# Patient Record
Sex: Male | Born: 1935 | Race: White | Hispanic: No | Marital: Married | Smoking: Former smoker
Health system: Southern US, Community
[De-identification: ages and names within clinical notes are randomized; demographics above are authoritative.]

## PROBLEM LIST (undated history)

## (undated) DIAGNOSIS — I5043 Acute on chronic combined systolic (congestive) and diastolic (congestive) heart failure: Secondary | ICD-10-CM

## (undated) DIAGNOSIS — I10 Essential (primary) hypertension: Secondary | ICD-10-CM

## (undated) DIAGNOSIS — I4891 Unspecified atrial fibrillation: Secondary | ICD-10-CM

## (undated) DIAGNOSIS — I714 Abdominal aortic aneurysm, without rupture, unspecified: Secondary | ICD-10-CM

## (undated) DIAGNOSIS — J9611 Chronic respiratory failure with hypoxia: Secondary | ICD-10-CM

## (undated) DIAGNOSIS — M1990 Unspecified osteoarthritis, unspecified site: Secondary | ICD-10-CM

## (undated) DIAGNOSIS — K635 Polyp of colon: Secondary | ICD-10-CM

## (undated) DIAGNOSIS — N400 Enlarged prostate without lower urinary tract symptoms: Secondary | ICD-10-CM

## (undated) DIAGNOSIS — I4719 Other supraventricular tachycardia: Secondary | ICD-10-CM

## (undated) DIAGNOSIS — I214 Non-ST elevation (NSTEMI) myocardial infarction: Secondary | ICD-10-CM

## (undated) DIAGNOSIS — I5041 Acute combined systolic (congestive) and diastolic (congestive) heart failure: Secondary | ICD-10-CM

## (undated) DIAGNOSIS — I471 Supraventricular tachycardia: Secondary | ICD-10-CM

## (undated) DIAGNOSIS — J309 Allergic rhinitis, unspecified: Secondary | ICD-10-CM

## (undated) HISTORY — DX: Abdominal aortic aneurysm, without rupture: I71.4

## (undated) HISTORY — DX: Acute combined systolic (congestive) and diastolic (congestive) heart failure: I50.41

## (undated) HISTORY — DX: Unspecified atrial fibrillation: I48.91

## (undated) HISTORY — DX: Other supraventricular tachycardia: I47.19

## (undated) HISTORY — DX: Non-ST elevation (NSTEMI) myocardial infarction: I21.4

## (undated) HISTORY — PX: REPLACEMENT TOTAL KNEE: SUR1224

## (undated) HISTORY — PX: COLONOSCOPY: SHX174

## (undated) HISTORY — DX: Supraventricular tachycardia: I47.1

## (undated) HISTORY — DX: Unspecified osteoarthritis, unspecified site: M19.90

## (undated) HISTORY — PX: HERNIA REPAIR: SHX51

## (undated) HISTORY — DX: Benign prostatic hyperplasia without lower urinary tract symptoms: N40.0

## (undated) HISTORY — PX: CATARACT EXTRACTION, BILATERAL: SHX1313

## (undated) HISTORY — DX: Essential (primary) hypertension: I10

## (undated) HISTORY — DX: Polyp of colon: K63.5

## (undated) HISTORY — PX: TONSILLECTOMY: SUR1361

## (undated) HISTORY — DX: Acute on chronic combined systolic (congestive) and diastolic (congestive) heart failure: I50.43

## (undated) HISTORY — DX: Abdominal aortic aneurysm, without rupture, unspecified: I71.40

## (undated) HISTORY — DX: Chronic respiratory failure with hypoxia: J96.11

## (undated) HISTORY — DX: Allergic rhinitis, unspecified: J30.9

---

## 2000-01-22 ENCOUNTER — Encounter: Payer: Self-pay | Admitting: Geriatric Medicine

## 2000-01-22 ENCOUNTER — Encounter: Admission: RE | Admit: 2000-01-22 | Discharge: 2000-01-22 | Payer: Self-pay | Admitting: Geriatric Medicine

## 2003-12-22 ENCOUNTER — Ambulatory Visit (HOSPITAL_COMMUNITY): Admission: RE | Admit: 2003-12-22 | Discharge: 2003-12-22 | Payer: Self-pay | Admitting: Geriatric Medicine

## 2005-02-12 ENCOUNTER — Ambulatory Visit (HOSPITAL_COMMUNITY): Admission: RE | Admit: 2005-02-12 | Discharge: 2005-02-12 | Payer: Self-pay | Admitting: Gastroenterology

## 2005-02-12 ENCOUNTER — Encounter (INDEPENDENT_AMBULATORY_CARE_PROVIDER_SITE_OTHER): Payer: Self-pay | Admitting: *Deleted

## 2008-02-20 ENCOUNTER — Encounter: Admission: RE | Admit: 2008-02-20 | Discharge: 2008-02-20 | Payer: Self-pay | Admitting: Geriatric Medicine

## 2009-07-06 ENCOUNTER — Encounter: Admission: RE | Admit: 2009-07-06 | Discharge: 2009-07-06 | Payer: Self-pay | Admitting: Geriatric Medicine

## 2009-07-18 ENCOUNTER — Encounter: Admission: RE | Admit: 2009-07-18 | Discharge: 2009-07-18 | Payer: Self-pay | Admitting: Geriatric Medicine

## 2010-03-23 ENCOUNTER — Encounter: Admission: RE | Admit: 2010-03-23 | Discharge: 2010-03-23 | Payer: Self-pay | Admitting: Geriatric Medicine

## 2011-02-16 NOTE — Op Note (Signed)
[name], [name]                 ACCOUNT NO.:  [id number]   MEDICAL RECORD NO.:  [id number]          PATIENT TYPE:  AMB   LOCATION:  ENDO                         FACILITY:  [facility]   PHYSICIAN:  Earle Gell, M.D.   DATE OF BIRTH:  21-Aug-1936   DATE OF PROCEDURE:  02/12/2005  DATE OF DISCHARGE:                                 OPERATIVE REPORT   PROCEDURE:  Colonoscopy and polypectomy.   PROCEDURE INDICATIONS:  [name] is a 75 year old male born Aug 13, 1936.  Mr. Carias is scheduled to undergo his first screening colonoscopy with  polypectomy to prevent colon cancer.  His 1999 and 2003 health maintenance  flexible proctosigmoidoscopies performed by Dr. [name] were normal.   ENDOSCOPIST:  Earle Gell, M.D.   PREMEDICATION:  Versed 7 mg, Demerol 70 mg.   PROCEDURE:  After obtaining informed consent, Mr. Gettel was placed in the  left lateral decubitus position.  I administered intravenous Demerol and  intravenous Versed to achieve conscious sedation for the procedure.  The  patient's blood pressure, oxygen saturation and cardiac rhythm were  monitored throughout the procedure and documented in the medical record.   Anal inspection was normal.  Digital rectal exam reveals an enlarged  prostate.  The Olympus adjustable pediatric colonoscope was then introduced  into the rectum and with a great deal of difficulty, eventually advanced to  the cecum.  Advancing the colonoscope to the hepatic flexure was relatively  easy.  Due to colonic loop formation,  I had a great deal of difficulty in  rounding the hepatic flexure and eventually intubating the cecum.  This was  accomplished with the patient in the supine position applying periumbilical  abdominal pressure.  Colonic preparation for the exam today was  satisfactory.   RECTUM:  Normal.   SIGMOID COLON AND DESCENDING COLON:  Extensive left colonic diverticulosis.  Small polyps could have easily been missed in this  segment of the colon due  to the vast number of diverticula.   SPLENIC FLEXURE:  Normal.   TRANSVERSE COLON:  Normal.   HEPATIC FLEXURE:  Normal.   ASCENDING COLON:  Normal.   CECUM AND ILEOCECAL VALVE:  From the distal cecum, a 2-mm sessile polyp was  removed with the electrocautery snare.   ASSESSMENT:  1.  Extensive left colonic diverticulosis.  2.  A small polyp was removed from the distal cecum.   RECOMMENDATIONS:  Virtual colonoscopy in 5 years.      MJ/MEDQ  D:  02/12/2005  T:  02/12/2005  Job:  [id number]   cc:   Hal T. Stoneking, M.D.  [address] [place], [place] [address]  Fax: [phone]

## 2015-05-10 ENCOUNTER — Ambulatory Visit
Admission: RE | Admit: 2015-05-10 | Discharge: 2015-05-10 | Disposition: A | Discharge status: Home / Self Care | Payer: Medicare Other | Source: Ambulatory Visit | Attending: Geriatric Medicine | Admitting: Geriatric Medicine

## 2015-05-10 ENCOUNTER — Other Ambulatory Visit: Payer: Self-pay | Admitting: Geriatric Medicine

## 2015-05-10 DIAGNOSIS — R05 Cough: Secondary | ICD-10-CM

## 2015-05-10 DIAGNOSIS — R059 Cough, unspecified: Secondary | ICD-10-CM

## 2016-04-17 ENCOUNTER — Other Ambulatory Visit: Payer: Self-pay | Admitting: Geriatric Medicine

## 2016-04-17 ENCOUNTER — Ambulatory Visit
Admission: RE | Admit: 2016-04-17 | Discharge: 2016-04-17 | Disposition: A | Discharge status: Home / Self Care | Payer: Medicare Other | Source: Ambulatory Visit | Attending: Geriatric Medicine | Admitting: Geriatric Medicine

## 2016-04-17 DIAGNOSIS — R042 Hemoptysis: Secondary | ICD-10-CM

## 2016-04-18 ENCOUNTER — Other Ambulatory Visit: Payer: Self-pay | Admitting: Geriatric Medicine

## 2016-04-18 DIAGNOSIS — I712 Thoracic aortic aneurysm, without rupture, unspecified: Secondary | ICD-10-CM

## 2016-04-23 ENCOUNTER — Other Ambulatory Visit: Payer: Self-pay | Admitting: Geriatric Medicine

## 2016-04-23 DIAGNOSIS — I712 Thoracic aortic aneurysm, without rupture, unspecified: Secondary | ICD-10-CM

## 2016-04-23 DIAGNOSIS — J8410 Pulmonary fibrosis, unspecified: Secondary | ICD-10-CM

## 2016-04-24 ENCOUNTER — Ambulatory Visit
Admission: RE | Admit: 2016-04-24 | Discharge: 2016-04-24 | Disposition: A | Discharge status: Home / Self Care | Payer: Medicare Other | Source: Ambulatory Visit | Attending: Geriatric Medicine | Admitting: Geriatric Medicine

## 2016-04-24 DIAGNOSIS — J8410 Pulmonary fibrosis, unspecified: Secondary | ICD-10-CM

## 2016-04-24 DIAGNOSIS — I712 Thoracic aortic aneurysm, without rupture, unspecified: Secondary | ICD-10-CM

## 2016-04-26 ENCOUNTER — Other Ambulatory Visit: Payer: Self-pay | Admitting: *Deleted

## 2016-04-26 ENCOUNTER — Encounter: Payer: Self-pay | Admitting: *Deleted

## 2016-04-26 ENCOUNTER — Telehealth: Payer: Self-pay | Admitting: Pulmonary Disease

## 2016-04-26 ENCOUNTER — Other Ambulatory Visit (INDEPENDENT_AMBULATORY_CARE_PROVIDER_SITE_OTHER): Payer: Medicare Other

## 2016-04-26 ENCOUNTER — Ambulatory Visit (INDEPENDENT_AMBULATORY_CARE_PROVIDER_SITE_OTHER): Payer: Medicare Other | Admitting: Pulmonary Disease

## 2016-04-26 DIAGNOSIS — I719 Aortic aneurysm of unspecified site, without rupture: Secondary | ICD-10-CM

## 2016-04-26 DIAGNOSIS — Z77090 Contact with and (suspected) exposure to asbestos: Secondary | ICD-10-CM

## 2016-04-26 DIAGNOSIS — J309 Allergic rhinitis, unspecified: Secondary | ICD-10-CM | POA: Insufficient documentation

## 2016-04-26 DIAGNOSIS — I10 Essential (primary) hypertension: Secondary | ICD-10-CM

## 2016-04-26 DIAGNOSIS — J849 Interstitial pulmonary disease, unspecified: Secondary | ICD-10-CM

## 2016-04-26 DIAGNOSIS — J302 Other seasonal allergic rhinitis: Secondary | ICD-10-CM

## 2016-04-26 DIAGNOSIS — R011 Cardiac murmur, unspecified: Secondary | ICD-10-CM | POA: Insufficient documentation

## 2016-04-26 DIAGNOSIS — J439 Emphysema, unspecified: Secondary | ICD-10-CM

## 2016-04-26 DIAGNOSIS — N400 Enlarged prostate without lower urinary tract symptoms: Secondary | ICD-10-CM | POA: Insufficient documentation

## 2016-04-26 DIAGNOSIS — M1990 Unspecified osteoarthritis, unspecified site: Secondary | ICD-10-CM

## 2016-04-26 HISTORY — DX: Aortic aneurysm of unspecified site, without rupture: I71.9

## 2016-04-26 HISTORY — DX: Essential (primary) hypertension: I10

## 2016-04-26 HISTORY — DX: Allergic rhinitis, unspecified: J30.9

## 2016-04-26 HISTORY — DX: Emphysema, unspecified: J43.9

## 2016-04-26 HISTORY — DX: Contact with and (suspected) exposure to asbestos: Z77.090

## 2016-04-26 HISTORY — DX: Benign prostatic hyperplasia without lower urinary tract symptoms: N40.0

## 2016-04-26 HISTORY — DX: Cardiac murmur, unspecified: R01.1

## 2016-04-26 HISTORY — DX: Interstitial pulmonary disease, unspecified: J84.9

## 2016-04-26 LAB — SEDIMENTATION RATE: Sed Rate: 77 mm/hr — ABNORMAL HIGH (ref 0–20)

## 2016-04-26 LAB — RHEUMATOID FACTOR: RHEUMATOID FACTOR: 24 [IU]/mL — AB (ref <=14)

## 2016-04-26 LAB — C-REACTIVE PROTEIN: CRP: 2.4 mg/dL (ref 0.5–20.0)

## 2016-04-26 NOTE — Patient Instructions (Addendum)
Call me if you notice any new breathing problems before your next appointment.  We will review your test results at your next appointment.  Try using your inhaler during times when you're short of breath to see if it helps.  I will see you back in 4-6 weeks.  TESTS ORDERED: 1. Complete Echocardiogram with contrast 2. Full PFT before next appointment 3. 6MWT before next appointment 4. Serum alpha-1 antitrypsin phenotype, ESR, CRP, ANA with reflex to comprehensive, rheumatoid factor, anti-CCP, & hypersensitivity pneumonitis panel.

## 2016-04-26 NOTE — Telephone Encounter (Signed)
IMAGING HRCT CHEST W/O 04/24/16 (personally reviewed by me): Borderline cardiomegaly with mild aneurysmal dilatation of ascending aorta measuring 4.5 cm. Bilateral calcified pleural plaques noted. Apical predominant centrilobular & paraseptal emphysema. Cystic changes primarily in the apices likely more secondary to underlying emphysema. No pleural effusion. Patient does have some air trapping on expiratory phase. Diffuse bilateral intralobular septal thickening with some bilateral traction bronchiectasis. Findings appear more pronounced in the apices with some suggestion of sparing in the sulci bilaterally. No suggestion of honeycomb changes. Mediastinal and hilar adenopathy with largest lymph node precarinal measuring 1.3 cm. No axillary lymphadenopathy.

## 2016-04-26 NOTE — Progress Notes (Signed)
Subjective:    Patient ID: [name], male    DOB: 19-Apr-1936, 80 y.o.   MRN: [id number]  HPI He reports that for the last 3-4 years he has had a yearly "cold" that progresses to "acute bronchitis" that produces a cough that takes him long to get over. He reports this last episode he was prescribed an inhaler and cough medication with codeine that lingered. He reports that his breathing seemed to be worse than his baseline as well. He feels now his cough has completely resolved. He still notices significant dyspnea on exertion. He reports he is able to climb the 15 steps from his basement and keep going without stopping. He reports that walking 269f back to his house from his shed up a slight  Incline he has dyspnea but again doesn't have to stop. He has had wheezing intermittently but none recently. He does feel he sleeps better if he uses his inhaler at night before bed. He denies any chest tightness, pressure, pain or heaviness. He reports mild seasonal allergies and uses Flonase occasionally. Reports only intermittent reflux but nothing regular. No dysphagia or odynophagia. Does have occasional morning brash water taste. Reports he has had occasional rash around his ankles, particularly in the winter. He reports chronic pain in his right knee. No joint swelling or erythema. No stiffness in his hands. No dry eyes or dry mouth. No oral ulcers. No Raynaud's.  Review of Systems No dysuria or hematuria. No adenopathy in his neck, groin, or axilla. A pertinent 14 point review of systems is negative except as per the history of presenting illness.  No Known Allergies   Current Outpatient Prescriptions:  .  albuterol (PROAIR HFA) 108 (90 Base) MCG/ACT inhaler, Inhale 2 puffs into the lungs every 6 (six) hours as needed for wheezing or shortness of breath., Disp: , Rfl:  .  amLODipine (NORVASC) 10 MG tablet, Take 10 mg by mouth daily., Disp: , Rfl:  .  aspirin 81 MG tablet, Take 81 mg by mouth  daily., Disp: , Rfl:  .  fluticasone (FLONASE) 50 MCG/ACT nasal spray, 2 sprays each nostril daily as needed, Disp: , Rfl:  .  hydrochlorothiazide (HYDRODIURIL) 25 MG tablet, 1/2 daily, Disp: , Rfl:  .  KRILL OIL PO, Take by mouth daily., Disp: , Rfl:  .  losartan (COZAAR) 50 MG tablet, 1 tab daily, Disp: , Rfl:  .  Multiple Vitamin (MULTIVITAMIN) tablet, Take 1 tablet by mouth daily., Disp: , Rfl:  .  triamcinolone (KENALOG) 0.025 % cream, As needed, Disp: , Rfl:   Past Medical History:  Diagnosis Date  . [place]   . Allergic rhinitis   . BPH (benign prostatic hypertrophy)   . Colon polyp   . Hypertension   . Osteoarthritis     Past Surgical History:  Procedure Laterality Date  . CATARACT EXTRACTION, BILATERAL    . COLONOSCOPY    . HERNIA REPAIR     as child  . REPLACEMENT TOTAL KNEE Left   . TONSILLECTOMY      Family History  Problem Relation Age of Onset  . Heart disease Mother   . Leukemia Father   . Lung disease Neg Hx   . Rheumatologic disease Neg Hx     Social History   Social History  . Marital status: Married    Spouse name: N/A  . Number of children: 2  . Years of education: N/A   Occupational History  .  retired    Social History Main Topics  . Smoking status: Former Smoker    Packs/day: 1.00    Years: 10.00    Types: Cigarettes, Pipe, Cigars    Start date: 10/02/1955    Quit date: 10/01/1965  . Smokeless tobacco: Former Systems developer    Types: Chew  . Alcohol use No  . Drug use: No  . Sexual activity: Not Asked   Other Topics Concern  . None   Social History Narrative   Originally from [place]. Has always lived in [place]. Served in [employer] and has traveled aboard ship extensively. 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 Has asbestos exposure through his work for 3.5 years from 1957-1961. 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 No mold or bird exposure. Enjoys  golfing.       Objective:   Physical Exam BP 132/88 (BP Location: Left Arm, Cuff Size: Large)   Pulse 99   Ht _0  (1.905 m)   Wt 251 lb (113.9 kg)   SpO2 90%   BMI 31.37 kg/m  General:  Awake. Alert. No acute distress. Wife accompanying patient today. Integument:  Warm & dry. No rash on exposed skin.  Lymphatics:  No appreciated cervical or supraclavicular lymphadenoapthy. HEENT:  Moist mucus membranes. No oral ulcers. No scleral injection or icterus.  Cardiovascular:  Regular rate. 1+ pitting lower extremity edema. 3/6 systolic ejection murmur in aortic position. Pulmonary:  Good aeration & clear to auscultation bilaterally. Symmetric chest wall expansion. No accessory muscle use on room air. Abdomen: Soft. Normal bowel sounds. Protuberant. Grossly nontender. Musculoskeletal:  Normal bulk and tone. Hand grip strength 5/5 bilaterally. No joint deformity or effusion appreciated. Neurological:  CN 2-12 grossly in tact. No meningismus. Moving all 4 extremities equally. Symmetric brachioradialis deep tendon reflexes. Psychiatric:  Mood and affect congruent. Speech normal rhythm, rate & tone.   IMAGING HRCT CHEST W/O 04/24/16 (personally reviewed by me): Borderline cardiomegaly with mild aneurysmal dilatation of ascending aorta measuring 4.5 cm. Bilateral calcified pleural plaques noted. Apical predominant centrilobular & paraseptal emphysema. Cystic changes primarily in the apices likely more secondary to underlying emphysema. No pleural effusion. Patient does have some air trapping on expiratory phase. Diffuse bilateral intralobular septal thickening with some bilateral traction bronchiectasis. Findings appear more pronounced in the apices with some suggestion of sparing in the sulci bilaterally. No suggestion of honeycomb changes. Mediastinal and hilar adenopathy with largest lymph node precarinal measuring 1.3 cm. No axillary lymphadenopathy. We compared the patient's CT imaging from 2010  cystic changes the apices appear more pronounced in there is some intralobular septal thickening even on that CT scan.    Assessment & Plan:  80 year old male with significant prior exposure to asbestos. This is likely the cause for his calcified pleural plaques which are seen on CT imaging. I reviewed the patient's CT imaging with both he and his wife who was present today. The pattern on his high-resolution CT scan appears most consistent with worsening of his underlying emphysema and apical predominant cystic changes. He does have a 10+ pack year history of smoking which would raise his risk for COPD/emphysema as well as secondhand smoke exposure which was significant. The degree of worsening in his interstitial lung disease when compared with CT imaging from 2010 appears mild and I'm not sure of how much clinical significance especially in the setting of the air trapping seen on  his expiratory images. Certainly an autoimmune process is possible with his history of arthritis but this seems to be more consistent with osteoarthritis. Therefore, I am not starting the patient on immunosuppression at this time. Additionally, I do not feel that a surgical lung biopsy would be of high yield for the patient at this time. I'm holding off on initiating any additional inhaler medications until full pulmonary function testing can be obtained. As a final thought hypoxia on exertion could be contributing to his symptoms as well. I instructed the patient contact my office if he had any further breathing problems or questions before his next appointment and that we will review the test results at his follow-up.  1. Emphysema: Screening for alpha-1 antitrypsin deficiency. Checking full pulmonary function testing. Advised patient to try using albuterol inhaler to help treat dyspnea. 2. H/O Asbestos Exposure: Evident as pleural plaque formation with partial calcification on CT imaging. 3. ILD: Holding off on  immunosuppression at this time. Checking full pulmonary function testing as well as 6 minute walk test. Checking autoimmune workup as in #4. Also checking hypersensitivity pneumonitis panel. 4. Arthritis:  Suspect osteoarthritis. Checking serum ESR, CRP, ANA with reflex to comprehensive panel, rheumatoid factor, & anti-CCP. 5. Allergic Rhinitis: Continuing intermittent Flonase. Asymptomatic. 6. Heart Murmur: Checking complete transthoracic echocardiogram. 7. Health Maintenance: Status post Pneumovax January 2004 & Prevnar January 2016. 8. Follow-up: Patient to return to clinic in 4-6 weeks.  Sonia Baller Ashok Cordia, M.D. [facility] Pulmonary & Critical Care Pager:  [phone] After 3pm or if no response, call [phone] 11:09 AM 04/26/16

## 2016-04-27 ENCOUNTER — Ambulatory Visit (HOSPITAL_COMMUNITY): Payer: Medicare Other | Attending: Internal Medicine

## 2016-04-27 ENCOUNTER — Other Ambulatory Visit (HOSPITAL_COMMUNITY): Payer: Self-pay

## 2016-04-27 DIAGNOSIS — R011 Cardiac murmur, unspecified: Secondary | ICD-10-CM

## 2016-04-27 DIAGNOSIS — I119 Hypertensive heart disease without heart failure: Secondary | ICD-10-CM | POA: Insufficient documentation

## 2016-04-27 DIAGNOSIS — I358 Other nonrheumatic aortic valve disorders: Secondary | ICD-10-CM | POA: Insufficient documentation

## 2016-04-27 DIAGNOSIS — Z87891 Personal history of nicotine dependence: Secondary | ICD-10-CM | POA: Diagnosis not present

## 2016-04-27 LAB — ANA COMPREHENSIVE PANEL
Anti JO-1: <0.2 AI (ref 0.0–0.9)
Centromere Ab Screen: <0.2 AI (ref 0.0–0.9)
dsDNA Ab: 1 IU/mL (ref 0–9)

## 2016-04-27 LAB — CYCLIC CITRUL PEPTIDE ANTIBODY, IGG: CYCLIC CITRULLIN PEPTIDE AB: 92 U — AB

## 2016-04-27 LAB — ANA W/REFLEX: Anti Nuclear Antibody(ANA): NEGATIVE

## 2016-04-30 ENCOUNTER — Other Ambulatory Visit: Payer: Self-pay | Admitting: Pulmonary Disease

## 2016-04-30 DIAGNOSIS — R768 Other specified abnormal immunological findings in serum: Secondary | ICD-10-CM

## 2016-04-30 LAB — ALPHA-1-ANTITRYPSIN: A1 ANTITRYPSIN SER: 251 mg/dL — AB (ref 83–199)

## 2016-05-01 ENCOUNTER — Telehealth: Payer: Self-pay | Admitting: Pulmonary Disease

## 2016-05-01 LAB — HYPERSENSITIVITY PNUEMONITIS PROFILE

## 2016-05-01 NOTE — Telephone Encounter (Signed)
Spoke with the pt  He states that he received a call from someone but that person did not leave a name  He believes that this may be regarding appt with rheum  I see that we did make a referral, so will forward to [facility] to see if they called him  Please advise, thanks!

## 2016-05-01 NOTE — Telephone Encounter (Signed)
Called pt.  He states it was [facility] that called him.  He said he called them back & was put on hold & no one ever came back to the phone.  I verified he did have the correct # & apologized for no one coming back to him.  He was understanding & said he would call them back in the morning because they have already closed for today.  Nothing further needed at this time.

## 2016-06-12 ENCOUNTER — Encounter (INDEPENDENT_AMBULATORY_CARE_PROVIDER_SITE_OTHER): Payer: Self-pay

## 2016-06-12 ENCOUNTER — Ambulatory Visit (INDEPENDENT_AMBULATORY_CARE_PROVIDER_SITE_OTHER): Payer: Medicare Other | Admitting: Pulmonary Disease

## 2016-06-12 DIAGNOSIS — Z77090 Contact with and (suspected) exposure to asbestos: Secondary | ICD-10-CM

## 2016-06-12 DIAGNOSIS — J439 Emphysema, unspecified: Secondary | ICD-10-CM | POA: Diagnosis not present

## 2016-06-12 DIAGNOSIS — R0609 Other forms of dyspnea: Secondary | ICD-10-CM

## 2016-06-12 DIAGNOSIS — J849 Interstitial pulmonary disease, unspecified: Secondary | ICD-10-CM

## 2016-06-12 LAB — PULMONARY FUNCTION TEST
DL/VA % PRED: 59 %
DL/VA: 2.85 ml/min/mmHg/L
DLCO UNC % PRED: 36 %
DLCO cor % pred: 34 %
DLCO cor: 13.11 ml/min/mmHg
DLCO unc: 13.67 ml/min/mmHg
FEF 25-75 PRE: 3.85 L/s
FEF 25-75 Post: 4.31 L/sec
FEF2575-%CHANGE-POST: 11 %
FEF2575-%PRED-POST: 177 %
FEF2575-%Pred-Pre: 158 %
FEV1-%CHANGE-POST: 4 %
FEV1-%Pred-Post: 87 %
FEV1-%Pred-Pre: 84 %
FEV1-PRE: 2.9 L
FEV1-Post: 3.03 L
FEV1FVC-%Change-Post: 1 %
FEV1FVC-%PRED-PRE: 118 %
FEV6-%Change-Post: 2 %
FEV6-%Pred-Post: 77 %
FEV6-%Pred-Pre: 75 %
FEV6-Post: 3.5 L
FEV6-Pre: 3.4 L
FEV6FVC-%Pred-Post: 106 %
FEV6FVC-%Pred-Pre: 106 %
FVC-%Change-Post: 2 %
FVC-%PRED-PRE: 71 %
FVC-%Pred-Post: 73 %
FVC-POST: 3.5 L
FVC-PRE: 3.4 L
POST FEV1/FVC RATIO: 86 %
PRE FEV6/FVC RATIO: 100 %
Post FEV6/FVC ratio: 100 %
Pre FEV1/FVC ratio: 85 %
RV % PRED: 57 %
RV: 1.63 L
TLC % pred: 63 %
TLC: 5.03 L

## 2016-06-12 NOTE — Progress Notes (Signed)
PFT 06/12/16: FVC 3.40 L (71%) FEV1 2.90 L (84%) FEV1/FVC 0.85 FEF 25-75 3.85 L (158%) negative bronchodilator response TLC 5.03 L (63%) RV 57% ERV 102% DLCO corrected 34% (Hgb 16.2)  6MWT 06/12/16:  Walked 96 meters / Baseline Sat 93% on RA / Nadir Sat 85% on RA @ 1:20 (required 3 L/m continuous to maintain saturation & BP increased to max 200/110 w/o symptoms)  CARDIAC TTE (04/27/16): LV with mild LVH & EF 60-65%. Grade 1 diastolic dysfunction. LA & RA normal in size. RV normal in size and function. Very mild aortic stenosis without regurgitation. Trivial mitral regurgitation. No pulmonic regurgitation. No tricuspid regurgitation. No pericardial effusion.  LABS 04/26/16 Alpha-1 antitrypsin: 251 CRP: 2.4 ESR: 77 ANA: Negative Hypersensitivity pneumonitis panel: Negative Anti-CCP:  92 RF:  24 Centromere Ab Screen:  <0.2 Anti-Jo 1:  <0.2 DS DNA Ab:  1 RNP Ab:  <0.2 Smith Ab:  <0.2 Chromatin Ab:  <0.2 SCL-70:  <0.2 SSA:  <0.2 SSB:  <0.2

## 2016-06-12 NOTE — Progress Notes (Signed)
Test reviewed.

## 2016-06-13 ENCOUNTER — Ambulatory Visit (INDEPENDENT_AMBULATORY_CARE_PROVIDER_SITE_OTHER): Payer: Medicare Other | Admitting: Pulmonary Disease

## 2016-06-13 ENCOUNTER — Other Ambulatory Visit: Payer: Medicare Other

## 2016-06-13 ENCOUNTER — Encounter: Payer: Self-pay | Admitting: Pulmonary Disease

## 2016-06-13 VITALS — BP 150/84 | HR 98 | Ht 74.0 in | Wt 253.4 lb

## 2016-06-13 DIAGNOSIS — Z77090 Contact with and (suspected) exposure to asbestos: Secondary | ICD-10-CM

## 2016-06-13 DIAGNOSIS — J849 Interstitial pulmonary disease, unspecified: Secondary | ICD-10-CM

## 2016-06-13 DIAGNOSIS — J439 Emphysema, unspecified: Secondary | ICD-10-CM

## 2016-06-13 DIAGNOSIS — K219 Gastro-esophageal reflux disease without esophagitis: Secondary | ICD-10-CM

## 2016-06-13 DIAGNOSIS — Z23 Encounter for immunization: Secondary | ICD-10-CM

## 2016-06-13 DIAGNOSIS — J984 Other disorders of lung: Secondary | ICD-10-CM

## 2016-06-13 DIAGNOSIS — J309 Allergic rhinitis, unspecified: Secondary | ICD-10-CM

## 2016-06-13 DIAGNOSIS — J9691 Respiratory failure, unspecified with hypoxia: Secondary | ICD-10-CM | POA: Diagnosis not present

## 2016-06-13 DIAGNOSIS — M1990 Unspecified osteoarthritis, unspecified site: Secondary | ICD-10-CM

## 2016-06-13 DIAGNOSIS — J9610 Chronic respiratory failure, unspecified whether with hypoxia or hypercapnia: Secondary | ICD-10-CM | POA: Insufficient documentation

## 2016-06-13 DIAGNOSIS — J9690 Respiratory failure, unspecified, unspecified whether with hypoxia or hypercapnia: Secondary | ICD-10-CM

## 2016-06-13 HISTORY — DX: Gastro-esophageal reflux disease without esophagitis: K21.9

## 2016-06-13 HISTORY — DX: Other disorders of lung: J98.4

## 2016-06-13 HISTORY — DX: Chronic respiratory failure, unspecified whether with hypoxia or hypercapnia: J96.10

## 2016-06-13 HISTORY — DX: Respiratory failure, unspecified, unspecified whether with hypoxia or hypercapnia: J96.90

## 2016-06-13 MED ORDER — UMECLIDINIUM BROMIDE 62.5 MCG/INH IN AEPB: 1.0000 | INHALATION_SPRAY | Freq: Every day | RESPIRATORY_TRACT | 0 refills | Status: DC

## 2016-06-13 MED ORDER — RANITIDINE HCL 150 MG PO TABS: 150.0000 mg | ORAL_TABLET | Freq: Every day | ORAL | 3 refills | Status: DC

## 2016-06-13 NOTE — Patient Instructions (Addendum)
   Call me if you have any new breathing problems before your next appointment.  Stop using your Incruse inhaler if you have any trouble peeing or blurry vision.  Use the Incruse inhaler - 1 inhalation once daily.  Remember to avoid eating within 2-3 hours of bedtime and take the Zantac I am prescribing you regularly to help control your reflux.  We will repeat a breathing & walking test at your next appointment.  I will see you back in 3 months or sooner if needed.   TESTS ORDERED: 1. Spirometry with DLCO at next appointment 2. 6MWT with oxygen titration at next appointment 3. Alpha-1 Antitrypsin Phenotype today

## 2016-06-13 NOTE — Progress Notes (Signed)
Subjective:    Patient ID: [name], male    DOB: [birth year], 80 y.o.   MRN: [id number]  C.C.:  Follow-up for Emphysema, ILD, H/O Asbestos Exposure, Allergic Rhinitis, Arthritis, & GERD.  HPI Emphysema:  Reports he has no dyspnea as long as he doesn't exert himself. Does have an intermittent cough productive of  "yellow" phlegm in the morning. He does feel his dyspnea is improving. Hasn't used his rescue inhaler.   ILD: Questionable whether or not this is secondary to underlying rheumatoid versus asbestosis. No chest tightness, pressure or pain.  H/O Asbestos Exposure:  Patient does have pleural plaques on CT imaging.   Allergic Rhinitis:  He reports he has minimal sinus congestion, pressure, or drainage now. He is using a dust mask. He is still using his Flonase intermittently.  Arthritis: Serum blood work shows a positive anti-CCP and rheumatoid factor. However has negative ANA. Still having some mild joint pain and stiffness.  GERD:  He reports reflux a couple of times a week. He takes OTC medication intermittently. He does wake up sometimes with morning brash water taste.   Review of Systems No syncope or near syncope. He reports a mild, intermittent rash around his ankles that is unchanged.   No Known Allergies   Current Outpatient Prescriptions:  .  albuterol (PROAIR HFA) 108 (90 Base) MCG/ACT inhaler, Inhale 2 puffs into the lungs every 6 (six) hours as needed for wheezing or shortness of breath., Disp: , Rfl:  .  amLODipine (NORVASC) 10 MG tablet, Take 10 mg by mouth daily., Disp: , Rfl:  .  aspirin 81 MG tablet, Take 81 mg by mouth daily., Disp: , Rfl:  .  fluticasone (FLONASE) 50 MCG/ACT nasal spray, 2 sprays each nostril daily as needed, Disp: , Rfl:  .  hydrochlorothiazide (HYDRODIURIL) 25 MG tablet, 1/2 daily, Disp: , Rfl:  .  KRILL OIL PO, Take by mouth daily., Disp: , Rfl:  .  losartan (COZAAR) 50 MG tablet, 1 tab daily, Disp: , Rfl:  .  Multiple Vitamin  (MULTIVITAMIN) tablet, Take 1 tablet by mouth daily., Disp: , Rfl:  .  triamcinolone (KENALOG) 0.025 % cream, As needed, Disp: , Rfl:  .  ranitidine (ZANTAC) 150 MG tablet, Take 1 tablet (150 mg total) by mouth at bedtime., Disp: 30 tablet, Rfl: 3 .  umeclidinium bromide (INCRUSE ELLIPTA) 62.5 MCG/INH AEPB, Inhale 1 puff into the lungs daily., Disp: 2 each, Rfl: 0  Past Medical History:  Diagnosis Date  . AAA (abdominal aortic aneurysm) ([place])   . Allergic rhinitis   . BPH (benign prostatic hypertrophy)   . Colon polyp   . Hypertension   . Osteoarthritis     Past Surgical History:  Procedure Laterality Date  . CATARACT EXTRACTION, BILATERAL    . COLONOSCOPY    . HERNIA REPAIR     as child  . REPLACEMENT TOTAL KNEE Left   . TONSILLECTOMY      Family History  Problem Relation Age of Onset  . Heart disease Mother   . Leukemia Father   . Lung disease Neg Hx   . Rheumatologic disease Neg Hx     Social History   Social History  . Marital status: Married    Spouse name: N/A  . Number of children: 2  . Years of education: N/A   Occupational History  . retired    Social History Main Topics  . Smoking status: Former Smoker    Packs/day: 1.00  Years: 10.00    Types: Cigarettes, Pipe, Cigars    Start date: 10/02/1955    Quit date: 10/01/1965  . Smokeless tobacco: Former Systems developer    Types: Chew  . Alcohol use No  . Drug use: No  . Sexual activity: Not Asked   Other Topics Concern  . None   Social History Narrative   Originally from [place]. Has always lived in [place]. Served in [employer] and has traveled aboard ship extensively. 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 Has asbestos exposure through his work for 3.5 years from 1957-1961. 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 No mold or bird exposure. Enjoys golfing.       Objective:   Physical Exam BP (!) 150/84 (BP Location: Left Arm, Cuff Size: Large)    Pulse 98   Ht _0  (1.88 m)   Wt 253 lb 6.4 oz (114.9 kg)   SpO2 94%   BMI 32.53 kg/m  General:  Awake. Alert. No distress. Wife accompanying patient today. Integument:  Warm & dry. No rash on exposed skin.  Lymphatics:  No appreciated cervical or supraclavicular lymphadenoapthy. HEENT:  Moist mucus membranes. No oral ulcers. No scleral icterus.  Cardiovascular:  Regular rate. 1+ pitting lower extremity edema unchanged. 3/6 systolic ejection murmur in aortic position. Pulmonary:  Overall clear to auscultation. No accessory muscle use on room air. Speaking in complete sentences. Abdomen: Soft. Normal bowel sounds. Protuberant. Grossly nontender.   PFT 06/12/16: FVC 3.40 L (71%) FEV1 2.90 L (84%) FEV1/FVC 0.85 FEF 25-75 3.85 L (158%) negative bronchodilator response TLC 5.03 L (63%) RV 57% ERV 102% DLCO corrected 34% (Hgb 16.2)  6MWT 06/12/16:  Walked 96 meters / Baseline Sat 93% on RA / Nadir Sat 85% on RA @ 1:20 (required 3 L/m continuous to maintain saturation & BP increased to max 200/110 w/o symptoms)  IMAGING HRCT CHEST W/O 04/24/16 (previously reviewed by me): Borderline cardiomegaly with mild aneurysmal dilatation of ascending aorta measuring 4.5 cm. Bilateral calcified pleural plaques noted. Apical predominant centrilobular & paraseptal emphysema. Cystic changes primarily in the apices likely more secondary to underlying emphysema. No pleural effusion. Patient does have some air trapping on expiratory phase. Diffuse bilateral intralobular septal thickening with some bilateral traction bronchiectasis. Findings appear more pronounced in the apices with some suggestion of sparing in the sulci bilaterally. No suggestion of honeycomb changes. Mediastinal and hilar adenopathy with largest lymph node precarinal measuring 1.3 cm. No axillary lymphadenopathy. We compared the patient's CT imaging from 2010 cystic changes the apices appear more pronounced in there is some intralobular septal  thickening even on that CT scan.  CARDIAC TTE (04/27/16): LV with mild LVH & EF 60-65%. Grade 1 diastolic dysfunction. LA & RA normal in size. RV normal in size and function. Very mild aortic stenosis without regurgitation. Trivial mitral regurgitation. No pulmonic regurgitation. No tricuspid regurgitation. No pericardial effusion.  LABS 04/26/16 Alpha-1 antitrypsin: 251 CRP: 2.4 ESR: 77 ANA: Negative Hypersensitivity pneumonitis panel: Negative Anti-CCP:  92 RF:  24 Centromere Ab Screen:  <0.2 Anti-Jo 1:  <0.2 DS DNA Ab:  1 RNP Ab:  <0.2 Smith Ab:  <0.2 Chromatin Ab:  <0.2 SCL-70:  <0.2 SSA:  <0.2 SSB:  <0.2    Assessment & Plan:  80 y.o. male with significant prior exposure to asbestos. Reviewed patient's pulmonary function testing which does show a moderate restrictive lung disease likely secondary to his  underlying interstitial lung disease. With his positive serologies suggestive of rheumatoid I do question whether or not he may benefit from immunosuppression. I await evaluation by rheumatology. Certainly with his slight symptomatic improvement in terms of his cough and dyspnea this could be consistent with an intermittent flare of his underlying rheumatoid. We did discuss initiating treatment for his underlying emphysema which may help to improve his pulmonary function and decrease his oxygen requirement with exertion. I also educated the patient today on the need for oxygen therapy with exertion to minimize pulmonary arterial vasoconstriction and any potential negative consequences to his right ventricular dysfunction. We also discussed the need for complete control of his underlying reflux as this too could worsen parenchymal inflammation with interstitial lung disease. I instructed the patient to contact my office if he had any new breathing problems or questions before his next appointment.  1. Emphysema: Rechecking alpha-1 antitrypsin phenotype. Starting patient on Incruse  Ellipta. Repeat spirometry with DLCO at next appointment. 2. ILD: Holding on immunosuppression. Repeating spirometry with DLCO at next appointment as well as 6 minute walk test. 3. Hypoxic Respiratory Failure: Prescribing the patient oxygen at 3 L/m with exertion with a portable concentrator. Repeating 6 minute a walk test with oxygen titration at next appointment. 4. H/O Asbestos Exposure: Evident as pleural plaque formation with partial calcification on CT imaging. 5. Allergic Rhinitis: Continuing intermittent Flonase. Asymptomatic. 6. Arthritis:  Has been referred to Rheumatology & has an appointment this month.  7. GERD:  Starting patient on Zantac 135m qhs & counseled on proper lifestyle modifications. 8. Health Maintenance: Status post Pneumovax January 2004 & Prevnar January 2016. Administering high-dose influenza vaccine today. 9. Follow-up: Patient to return to clinic in 3 months or sooner if needed.  JSonia BallerNAshok Cordia M.D. [facility] Pager:  [phone] 3pm or if no response, call [phone] 3:18 PM 06/13/16

## 2016-06-14 ENCOUNTER — Encounter: Payer: Self-pay | Admitting: Pulmonary Disease

## 2016-06-19 LAB — ALPHA-1 ANTITRYPSIN PHENOTYPE: A1 ANTITRYPSIN: 198 mg/dL (ref 83–199)

## 2016-07-04 ENCOUNTER — Telehealth: Payer: Self-pay | Admitting: Pulmonary Disease

## 2016-07-04 DIAGNOSIS — J9691 Respiratory failure, unspecified with hypoxia: Secondary | ICD-10-CM

## 2016-07-04 DIAGNOSIS — J439 Emphysema, unspecified: Secondary | ICD-10-CM

## 2016-07-04 NOTE — Telephone Encounter (Signed)
Called and spoke with pt and he stated that he feels the incruse has not helped him at all, he cannot tell a difference.  He was given 2 samples here in the office and wanted to call with an update today.   He stated that today was his first day at pulmonary rehab in [place].  He also wanted to ask about a POC.  He stated that he does not feel that he needs this at this time, but he may eventually and wanted to ask about getting one.  He would like one of the small machines, in case he needs this when he is playing golf or out and about.  JN please advise. thanks

## 2016-07-04 NOTE — Telephone Encounter (Signed)
Spoke with pt's wife (dpr on file), aware of recs.  POC order placed.  Nothing further needed.

## 2016-07-04 NOTE — Telephone Encounter (Signed)
My last note says that we were supposed to be ordering a portable oxygen concentrator. He should be on 3 L/m with exertion at least and I'm not sure he will be able to maintain his saturations at this on pulse but should be assessed by his DME company. If he doesn't feel like the Incruse is helping significantly then he can stop using it.

## 2016-08-09 ENCOUNTER — Other Ambulatory Visit: Payer: Self-pay

## 2016-08-09 MED ORDER — RANITIDINE HCL 150 MG PO TABS: 150.0000 mg | ORAL_TABLET | Freq: Every day | ORAL | 3 refills | Status: DC

## 2016-09-12 ENCOUNTER — Ambulatory Visit (INDEPENDENT_AMBULATORY_CARE_PROVIDER_SITE_OTHER): Payer: Medicare Other | Admitting: Pulmonary Disease

## 2016-09-12 ENCOUNTER — Encounter: Payer: Self-pay | Admitting: Pulmonary Disease

## 2016-09-12 VITALS — BP 132/78 | HR 85 | Ht 74.0 in | Wt 246.0 lb

## 2016-09-12 DIAGNOSIS — R0602 Shortness of breath: Secondary | ICD-10-CM

## 2016-09-12 DIAGNOSIS — J439 Emphysema, unspecified: Secondary | ICD-10-CM | POA: Diagnosis not present

## 2016-09-12 DIAGNOSIS — J849 Interstitial pulmonary disease, unspecified: Secondary | ICD-10-CM

## 2016-09-12 DIAGNOSIS — J309 Allergic rhinitis, unspecified: Secondary | ICD-10-CM

## 2016-09-12 DIAGNOSIS — R0600 Dyspnea, unspecified: Secondary | ICD-10-CM

## 2016-09-12 DIAGNOSIS — K219 Gastro-esophageal reflux disease without esophagitis: Secondary | ICD-10-CM | POA: Diagnosis not present

## 2016-09-12 HISTORY — DX: Dyspnea, unspecified: R06.00

## 2016-09-12 LAB — PULMONARY FUNCTION TEST
DL/VA % PRED: 63 %
DL/VA: 3.06 ml/min/mmHg/L
DLCO UNC % PRED: 42 %
DLCO UNC: 15.95 ml/min/mmHg
DLCO cor % pred: 40 %
DLCO cor: 15.45 ml/min/mmHg
FEF 25-75 Pre: 4.51 L/sec
FEF2575-%Pred-Pre: 186 %
FEV1-%PRED-PRE: 92 %
FEV1-PRE: 3.19 L
FEV1FVC-%Pred-Pre: 118 %
FEV6-%Pred-Pre: 81 %
FEV6-PRE: 3.68 L
FEV6FVC-%PRED-PRE: 106 %
FVC-%Pred-Pre: 78 %
FVC-PRE: 3.74 L
Pre FEV1/FVC ratio: 85 %
Pre FEV6/FVC Ratio: 100 %

## 2016-09-12 NOTE — Progress Notes (Signed)
Test reviewed.  

## 2016-09-12 NOTE — Patient Instructions (Signed)
   Continue taking your Zantac as prescribed.  I will see you back in 3 months or sooner if needed. Call me if you have any new breathing problems or questions before then.  TESTS ORDERED: 1. Spirometry with bronchodilator challenge & DLCO at next appointment 2. 6MWT on room air at next appointment

## 2016-09-12 NOTE — Progress Notes (Signed)
Subjective:    Patient ID: [name], male    DOB: January 17, 1936, 80 y.o.   MRN: [id number]  C.C.:  Follow-up for Pulmonary Emphysema, ILD, Acute Hypoxic Respiratory Failure, H/O Asbestos Exposure, Chronic Allergic Rhinitis, & GERD.  HPI Pulmonary Emphysema:  Alpha-1 antitrypsin phenotype MM. Started on Incruse at last appointment. However, patient had no symptoms improvement on the inhaler. He has been doing pulmonary rehab and has lost some weight. Patient did notice that with using Incruse inhaler it "took his breath away".  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 Reports her dyspnea is improving. He reports he does cough some in the morning but no other significant cough or wheezing. No syncope. He has had occasional near syncope with standing quickly.   Acute Hypoxic Respiratory Failure: Started on oxygen at 3 L/m with portable concentrator at last appointment. 6 minute walk test today shows no oxygen requirement.  H/O Asbestos Exposure:  Patient does have pleural plaques on Chest CT imaging.   Chronic Allergic Rhinitis:  Previously utilizing Flonase intermittently. He reports only rare nasal congestion & drainage.   GERD:  Started on Zantac at last appointment. Reports he is compliant with medication. No reflux or dyspepsia. No morning brash water taste.   Review of Systems No fever or chills. No abdominal pain or nausea. No rashes or abnormal bruising. No joint pain or swelling.   No Known Allergies  Current Outpatient Prescriptions on File Prior to Visit  Medication Sig Dispense Refill  . albuterol (PROAIR HFA) 108 (90 Base) MCG/ACT inhaler Inhale 2 puffs into the lungs every 6 (six) hours as needed for wheezing or shortness of breath.    Marland Kitchen amLODipine (NORVASC) 10 MG tablet Take 10 mg by mouth daily.    Marland Kitchen aspirin 81 MG tablet Take 81 mg by mouth daily.    . fluticasone (FLONASE) 50 MCG/ACT nasal spray 2 sprays each nostril daily as needed    . hydrochlorothiazide (HYDRODIURIL) 25 MG  tablet 1/2 daily    . KRILL OIL PO Take by mouth daily.    Marland Kitchen losartan (COZAAR) 50 MG tablet 1 tab daily    . Multiple Vitamin (MULTIVITAMIN) tablet Take 1 tablet by mouth daily.    . ranitidine (ZANTAC) 150 MG tablet Take 1 tablet (150 mg total) by mouth at bedtime. 90 tablet 3  . triamcinolone (KENALOG) 0.025 % cream As needed    . umeclidinium bromide (INCRUSE ELLIPTA) 62.5 MCG/INH AEPB Inhale 1 puff into the lungs daily. (Patient not taking: Reported on 09/12/2016) 2 each 0   No current facility-administered medications on file prior to visit.     Past Medical History:  Diagnosis Date  . AAA (abdominal aortic aneurysm) ([place])   . Allergic rhinitis   . BPH (benign prostatic hypertrophy)   . Colon polyp   . Hypertension   . Osteoarthritis     Past Surgical History:  Procedure Laterality Date  . CATARACT EXTRACTION, BILATERAL    . COLONOSCOPY    . HERNIA REPAIR     as child  . REPLACEMENT TOTAL KNEE Left   . TONSILLECTOMY      Family History  Problem Relation Age of Onset  . Heart disease Mother   . Leukemia Father   . Lung disease Neg Hx   . Rheumatologic disease Neg Hx     Social History   Social History  . Marital status: Married    Spouse name: N/A  . Number of children: 2  . Years  of education: N/A   Occupational History  . retired    Social History Main Topics  . Smoking status: Former Smoker    Packs/day: 1.00    Years: 10.00    Types: Cigarettes, Pipe, Cigars    Start date: 10/02/1955    Quit date: 10/01/1965  . Smokeless tobacco: Former Systems developer    Types: Chew  . Alcohol use No  . Drug use: No  . Sexual activity: Not Asked   Other Topics Concern  . None   Social History Narrative   Originally from [place]. Has always lived in [place]. Served in [employer] and has traveled aboard ship extensively. 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 Has asbestos exposure through his work for 3.5 years from 1957-1961. 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 No mold or bird exposure. Enjoys golfing.       Objective:   Physical Exam BP 132/78 (BP Location: Left Arm, Cuff Size: Large)   Pulse 85   Ht _0  (1.88 m)   Wt 246 lb (111.6 kg)   SpO2 95%   BMI 31.58 kg/m  General:  Awake. Alert. No distress. Wife accompanying patient today. Integument:  Warm & dry. No rash on exposed skin.  Lymphatics:  No appreciated cervical or supraclavicular lymphadenoapthy. HEENT:  Moist mucus membranes. No oral ulcers. No scleral icterus.  Cardiovascular:  Regular rate. 1+ pitting lower extremity edema unchanged. 3/6 systolic ejection murmur in aortic position. Pulmonary:  Overall clear to auscultation. No accessory muscle use on room air. Speaking in complete sentences. Abdomen: Soft. Normal bowel sounds. Protuberant. Grossly nontender.   PFT 09/12/16: FVC 3.74 L (70%) FEV1 3.19 L (92%) FEV1/FVC 0.85 FEF 25-75 4.51 L (186%)                                                                                                                         DLCO corrected 40% (hgb 15.8) 06/12/16: FVC 3.40 L (71%) FEV1 2.90 L (84%) FEV1/FVC 0.85 FEF 25-75 3.85 L (158%) negative bronchodilator response TLC 5.03 L (63%) RV 57% ERV 102% DLCO corrected 34% (Hgb 16.2)  6MWT 09/12/16:  Walked 336 meters / Baseline Sat 100% on RA / Nadir Sat 90% on RA (did not require oxygen) 06/12/16:  Walked 96 meters / Baseline Sat 93% on RA / Nadir Sat 85% on RA @ 1:20 (required 3 L/m continuous to maintain saturation & BP increased to max 200/110 w/o symptoms)  IMAGING HRCT CHEST W/O 04/24/16 (previously reviewed by me): Borderline cardiomegaly with mild aneurysmal dilatation of ascending aorta measuring 4.5 cm. Bilateral calcified pleural plaques noted. Apical predominant centrilobular & paraseptal emphysema. Cystic changes primarily in the apices likely more secondary to underlying emphysema. No pleural effusion. Patient does have some air  trapping on expiratory phase. Diffuse bilateral intralobular septal thickening with some bilateral traction bronchiectasis. Findings appear more pronounced in the apices with  some suggestion of sparing in the sulci bilaterally. No suggestion of honeycomb changes. Mediastinal and hilar adenopathy with largest lymph node precarinal measuring 1.3 cm. No axillary lymphadenopathy. We compared the patient's CT imaging from 2010 cystic changes the apices appear more pronounced in there is some intralobular septal thickening even on that CT scan.  CARDIAC TTE (04/27/16): LV with mild LVH & EF 60-65%. Grade 1 diastolic dysfunction. LA & RA normal in size. RV normal in size and function. Very mild aortic stenosis without regurgitation. Trivial mitral regurgitation. No pulmonic regurgitation. No tricuspid regurgitation. No pericardial effusion.  LABS 06/13/16 Alpha-1 antitrypsin: MM (198)  04/26/16 Alpha-1 antitrypsin: 251 CRP: 2.4 ESR: 77 ANA: Negative Hypersensitivity pneumonitis panel: Negative Anti-CCP:  92 RF:  24 Centromere Ab Screen:  <0.2 Anti-Jo 1:  <0.2 DS DNA Ab:  1 RNP Ab:  <0.2 Smith Ab:  <0.2 Chromatin Ab:  <0.2 SCL-70:  <0.2 SSA:  <0.2 SSB:  <0.2    Assessment & Plan:  80 y.o. male with significant prior exposure to asbestos with underlying ILD, Pulmonary Emphysema, Chronic Allergic Rhinitis, & GERD.Patient's previously noted significant desaturation and acute hypoxic respiratory failure have resolved on repeat 6 minute walk test. Patient's spirometry has significantly improved since previous testing likely owing to his weight loss. The improvement in his pulmonary function and walk test are encouraging and likely multifactorial in etiology with better control of his underlying reflux which was quite probably contributing to his interstitial lung disease. It still unclear whether or not the patient's interstitial lung disease is due to his previous asbestos exposure or possible  underlying rheumatoid. He has no joint symptoms or signs at this time. I am continuing to hold on initiating immunosuppression given his improvement in pulmonary function testing today. Continuing to hold on surgical lung biopsy as well. Although, this may be necessary to determine the etiology of his interstitial lung disease. I instructed the patient contact my office if he develops any new breathing problems or has any questions before his next appointment.  1. Pulmonary Emphysema:  Continuing to hold off on inhaler therapy. Repeat spirometry with bronchodilator challenge and DLCO appointment. 2. ILD:  Holding on immunosuppression. Continuing to monitor pulmonary function with spirometry with bronchodilator challenge and DLCO as well as 6 minute walk test at next appointment. Encouraged patient to continue to follow with rheumatology. 3. GERD: Continuing Zantac 150 mg by mouth daily at bedtime indefinitely. 4. Chronic Allergic Rhinitis:  Continuing symptomatic control with intermittent Flonase. No changes. 5. H/O Asbestos Exposure: Pleural involvement on CT imaging. Continuing to monitor with yearly imaging and full pulmonary function testing. 6. Acute Hypoxic Respiratory Failure: Resolved. Continuing to monitor for oxygen need with 6 minute walk test at next appointment. 7. Health Maintenance: S/P Influenza Vaccine September 2017, Pneumovax January 2004 & Prevnar January 2016. 8. Follow-up: Patient to return to clinic in 3 months or sooner if needed.  Sonia Baller Ashok Cordia, M.D. [facility] Pager:  [phone] After 3pm or if no response, call [phone] 12:38 PM 09/12/16

## 2016-09-17 ENCOUNTER — Telehealth: Payer: Self-pay | Admitting: Pulmonary Disease

## 2016-09-17 NOTE — Telephone Encounter (Signed)
Spoke with pt. And he stated he is going to another office who are not within [facility] and he stated his PFT and 43mt was not on MyChart, I did inform the pt. That I do not believe those are going to show up on MyChart but if he would like we could have the office notes and results from his test faxed. He did not have a fax number available , he stated he would call [facility] with the information. Will await his call back

## 2016-09-20 NOTE — Telephone Encounter (Signed)
Spoke with pt. He would like to have these records mailed to him. This has been taken care of. Nothing further was needed.

## 2017-02-13 ENCOUNTER — Telehealth: Payer: Self-pay | Admitting: Pulmonary Disease

## 2017-02-13 NOTE — Telephone Encounter (Signed)
Received letter from Joneen Boers along with [place] form requesting patient PFT results. JN requested PFT be printed so he could sign. PFT results were then faxed to Jeanell Sparrow at [phone]. Nothing further is needed.

## 2017-04-10 ENCOUNTER — Other Ambulatory Visit: Payer: Self-pay | Admitting: Geriatric Medicine

## 2017-04-10 DIAGNOSIS — J61 Pneumoconiosis due to asbestos and other mineral fibers: Secondary | ICD-10-CM

## 2017-04-25 ENCOUNTER — Other Ambulatory Visit: Payer: Medicare Other

## 2017-04-26 ENCOUNTER — Ambulatory Visit
Admission: RE | Admit: 2017-04-26 | Discharge: 2017-04-26 | Disposition: A | Discharge status: Home / Self Care | Payer: Medicare Other | Source: Ambulatory Visit | Attending: Geriatric Medicine | Admitting: Geriatric Medicine

## 2017-04-26 DIAGNOSIS — J61 Pneumoconiosis due to asbestos and other mineral fibers: Secondary | ICD-10-CM

## 2017-07-08 ENCOUNTER — Other Ambulatory Visit: Payer: Self-pay | Admitting: Pulmonary Disease

## 2017-11-08 ENCOUNTER — Other Ambulatory Visit: Payer: Self-pay | Admitting: Geriatric Medicine

## 2017-11-08 DIAGNOSIS — I7121 Aneurysm of the ascending aorta, without rupture: Secondary | ICD-10-CM

## 2017-11-08 DIAGNOSIS — I712 Thoracic aortic aneurysm, without rupture: Secondary | ICD-10-CM

## 2017-11-11 ENCOUNTER — Telehealth: Payer: Self-pay | Admitting: Acute Care

## 2017-11-11 ENCOUNTER — Ambulatory Visit: Payer: Medicare Other | Admitting: Acute Care

## 2017-11-11 ENCOUNTER — Encounter: Payer: Self-pay | Admitting: Acute Care

## 2017-11-11 VITALS — BP 130/70 | HR 93 | Ht 74.0 in | Wt 251.2 lb

## 2017-11-11 DIAGNOSIS — J439 Emphysema, unspecified: Secondary | ICD-10-CM | POA: Diagnosis not present

## 2017-11-11 DIAGNOSIS — J849 Interstitial pulmonary disease, unspecified: Secondary | ICD-10-CM

## 2017-11-11 DIAGNOSIS — Z77090 Contact with and (suspected) exposure to asbestos: Secondary | ICD-10-CM

## 2017-11-11 DIAGNOSIS — K219 Gastro-esophageal reflux disease without esophagitis: Secondary | ICD-10-CM | POA: Diagnosis not present

## 2017-11-11 NOTE — Progress Notes (Signed)
C.C.:  Follow-up for Pulmonary Emphysema, ILD, Acute Hypoxic Respiratory Failure, H/O Asbestos Exposure, Chronic Allergic Rhinitis, & GERD.  HPI Pulmonary Emphysema:  Alpha-1 antitrypsin phenotype MM. Started on Incruse at last appointment. However, patient had no symptoms improvement on the inhaler. He has been doing pulmonary rehab and has lost some weight. Patient did notice that with using Incruse inhaler it "took his breath away".  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 Reports her dyspnea is improving. He reports he does cough some in the morning but no other significant cough or wheezing. No syncope. He has had occasional near syncope with standing quickly.   Acute Hypoxic Respiratory Failure: Started on oxygen at 3 L/m with portable concentrator at last appointment. 6 minute walk test today shows no oxygen requirement.  H/O Asbestos Exposure:  Patient does have pleural plaques on Chest CT imaging.   Chronic Allergic Rhinitis:  Previously utilizing Flonase intermittently. He reports only rare nasal congestion & drainage.   GERD:  Started on Zantac at last appointment. Reports he is compliant with medication. No reflux or dyspepsia. No morning brash water taste.   History of Present Illness [name] is a 82 y.o. male former smoker quit 1967 with a 10 pack year smoking history with Pulmonary Emphysema, ILD, Acute Hypoxic Respiratory Failure, H/O Asbestos Exposure, Chronic Allergic Rhinitis, & GERD.He was previously followed by Dr. Ashok Cordia.   Last OV:08/2016. Plan at that time was:  1. Pulmonary Emphysema:  Continuing to hold off on inhaler therapy. Repeat spirometry with bronchodilator challenge and DLCO appointment. 2. ILD:  Holding on immunosuppression. Continuing to monitor pulmonary function with spirometry with bronchodilator challenge and DLCO as well as 6 minute walk test at next appointment. Encouraged patient to continue to follow with rheumatology. 3. GERD: Continuing  Zantac 150 mg by mouth daily at bedtime indefinitely. 4. Chronic Allergic Rhinitis:  Continuing symptomatic control with intermittent Flonase. No changes. 5. H/O Asbestos Exposure: Pleural involvement on CT imaging. Continuing to monitor with yearly imaging and full pulmonary function testing. 6. Acute Hypoxic Respiratory Failure: Resolved. Continuing to monitor for oxygen need with 6 minute walk test at next appointment. 7. Health Maintenance: S/P Influenza Vaccine September 2017, Pneumovax January 2004 & Prevnar January 2016. 8. Follow-up: Patient to return to clinic in 3 months or sooner if needed.     11/11/2017 Follow Up Appointment  Pt. Presents for annual follow up. He states he is doing well. He completed his 3 month Pulmonary Rehab and has continued on with the Wellness Program 3 times weekly for 1 hour. He states he only needs oxygen when he is on the treadmill. He wears 3 L with exertion. He states he does not feel he needs the oxygen with exertion at home. He monitors his oxygen very carefully with a sat monitor. He tried Incruse as maintenance and he stated it did not help. He has stopped taking it as he felt he has no need for it at present time. He states he does not wheeze. He states the only time he coughs is first thing in the morning.He saw Dr. Felipa Eth last week and he has ordered his CT Chest , which Dr. Ashok Cordia has done annually. He has had a healthy interval, no flu or pneumonia. He is up to date on all vaccines. He states he uses his rescue inhaler rarely.He denies fever, chest pain, orthopnea of hemoptysis.  Test Results:  PFT 09/12/16: FVC 3.74 L (70%) FEV1 3.19 L (92%) FEV1/FVC 0.85 FEF 25-75  4.51 L (186%)                                                                                                                         DLCO corrected 40% (hgb 15.8) 06/12/16: FVC 3.40 L (71%) FEV1 2.90 L (84%) FEV1/FVC 0.85 FEF 25-75 3.85 L (158%) negative bronchodilator response TLC  5.03 L (63%) RV 57% ERV 102% DLCO corrected 34% (Hgb 16.2)  6MWT 09/12/16:  Walked 336 meters / Baseline Sat 100% on RA / Nadir Sat 90% on RA (did not require oxygen) 06/12/16: Walked 96 meters / Baseline Sat 93% on RA / Nadir Sat 85% on RA @ 1:20 (required 3 L/m continuous to maintain saturation &BP increased to max 200/110 w/o symptoms)  IMAGING HRCT CHEST W/O 04/24/16 (previously reviewed by me):Borderline cardiomegaly with mild aneurysmal dilatation of ascending aorta measuring 4.5 cm. Bilateral calcified pleural plaques noted. Apical predominant centrilobular &paraseptalemphysema. Cystic changes primarily in the apices likely more secondary to underlying emphysema. No pleural effusion. Patient does have some air trapping on expiratory phase. Diffuse bilateral intralobular septal thickening with some bilateral traction bronchiectasis. Findings appear more pronounced in the apices with some suggestion of sparing in the sulcibilaterally. No suggestion of honeycomb changes. Mediastinal and hilar adenopathy with largest lymph node precarinal measuring 1.3 cm. No axillary lymphadenopathy. We compared the patient's CT imaging from 2010 cystic changes the apices appear more pronounced in there is some intralobular septal thickening even on that CT scan.  CARDIAC TTE (04/27/16):LV with mild LVH &EF 60-65%. Grade 1 diastolic dysfunction. LA & RA normal in size. RV normal in size and function. Very mild aortic stenosis without regurgitation. Trivial mitral regurgitation. No pulmonic regurgitation. No tricuspid regurgitation. No pericardial effusion.  LABS 06/13/16 Alpha-1 antitrypsin: MM (198)  04/26/16 Alpha-1 antitrypsin: 251 CRP: 2.4 ESR: 77 ANA: Negative Hypersensitivity pneumonitis panel: Negative Anti-CCP: 92 RF: 24 Centromere Ab Screen: <0.2 Anti-Jo 1: <0.2 DS DNA Ab: 1 RNP Ab: <0.2 Smith Ab: <0.2 Chromatin Ab: <0.2 SCL-70: <0.2 SSA: <0.2 SSB: <0.2      No  flowsheet data found.  No flowsheet data found.  BNP No results found for: BNP  ProBNP No results found for: PROBNP  PFT    Component Value Date/Time   FEV1PRE 3.19 09/12/2016 0925   FEV1POST 3.03 06/12/2016 1442   FVCPRE 3.74 09/12/2016 0925   FVCPOST 3.50 06/12/2016 1442   TLC 5.03 06/12/2016 1442   DLCOUNC 15.95 09/12/2016 0925   PREFEV1FVCRT 85 09/12/2016 0925   PSTFEV1FVCRT 86 06/12/2016 1442    No results found.   Past medical hx Past Medical History:  Diagnosis Date  . AAA (abdominal aortic aneurysm) ([place])   . Allergic rhinitis   . BPH (benign prostatic hypertrophy)   . Colon polyp   . Hypertension   . Osteoarthritis      Social History   Tobacco Use  . Smoking status: Former Smoker    Packs/day: 1.00    Years: 10.00  Pack years: 10.00    Types: Cigarettes, Pipe, Cigars    Start date: 10/02/1955    Last attempt to quit: 10/01/1965    Years since quitting: 52.1  . Smokeless tobacco: Former Systems developer    Types: Chew  Substance Use Topics  . Alcohol use: No  . Drug use: No    Mr.Bohnenkamp reports that he quit smoking about 52 years ago. His smoking use included cigarettes, pipe, and cigars. He started smoking about 62 years ago. He has a 10.00 pack-year smoking history. He has quit using smokeless tobacco. His smokeless tobacco use included chew. He reports that he does not drink alcohol or use drugs.  Tobacco Cessation: Former smoker with a 10 pack year smoking history quit 1967.  Past surgical hx, Family hx, Social hx all reviewed.  Current Outpatient Medications on File Prior to Visit  Medication Sig  . albuterol (PROAIR HFA) 108 (90 Base) MCG/ACT inhaler Inhale 2 puffs into the lungs every 6 (six) hours as needed for wheezing or shortness of breath.  Marland Kitchen amLODipine (NORVASC) 10 MG tablet Take 10 mg by mouth daily.  Marland Kitchen aspirin 81 MG tablet Take 81 mg by mouth daily.  . fluticasone (FLONASE) 50 MCG/ACT nasal spray 2 sprays each nostril daily as needed  .  hydrochlorothiazide (HYDRODIURIL) 25 MG tablet 1/2 daily  . KRILL OIL PO Take by mouth daily.  Marland Kitchen losartan (COZAAR) 50 MG tablet 1 tab daily  . Multiple Vitamin (MULTIVITAMIN) tablet Take 1 tablet by mouth daily.  . ranitidine (ZANTAC) 150 MG tablet TAKE 1 TABLET BY MOUTH AT  BEDTIME  . triamcinolone (KENALOG) 0.025 % cream As needed   No current facility-administered medications on file prior to visit.      No Known Allergies  Review Of Systems:  Constitutional:   No  weight loss, night sweats,  Fevers, chills, fatigue, or  lassitude.  HEENT:   No headaches,  Difficulty swallowing,  Tooth/dental problems, or  Sore throat,                No sneezing, itching, ear ache, nasal congestion, post nasal drip,   CV:  No chest pain,  Orthopnea, PND, swelling in lower extremities, anasarca, dizziness, palpitations, syncope.   GI  No heartburn, indigestion, abdominal pain, nausea, vomiting, diarrhea, change in bowel habits, loss of appetite, bloody stools.   Resp: + shortness of breath with exertion none at rest.  + excess mucus only in the morning which clears, no productive cough,  No non-productive cough,  No coughing up of blood.  No change in color of mucus.  No wheezing.  No chest wall deformity  Skin: no rash or lesions.  GU: no dysuria, change in color of urine, no urgency or frequency.  No flank pain, no hematuria   MS:  No joint pain or swelling.  No decreased range of motion.  No back pain.  Psych:  No change in mood or affect. No depression or anxiety.  No memory loss.   Vital Signs BP 130/70   Pulse 93   Ht _0  (1.88 m)   Wt 251 lb 3.2 oz (113.9 kg)   SpO2 96%   BMI 32.25 kg/m    Physical Exam:  General- No distress,  A&Ox3, pleasant ENT: No sinus tenderness, TM clear, pale nasal mucosa, no oral exudate,no post nasal drip, no LAN Cardiac: S1, S2, regular rate and rhythm, no murmur Chest: No wheeze/ rales/ dullness; no accessory muscle use, no nasal flaring, no  sternal retractions, slightly diminished per bases, few crackles noted. Abd.: Soft Non-tender, non-distended, BS + Ext: No clubbing cyanosis, edema Neuro:  normal strength, MAE x 4, A&O x 3 Skin: No rashes, warm and dry Psych: normal mood and behavior   Assessment/Plan  Emphysema of lung (HCC) Stable interval Did not feel Incruse helped him so he stopped taking it. Uses rescue inhaler rarely Continues with Wellness program 3 times a week. Plan: 6 month follow up with PFT with DLCO with Dr. Halford Chessman ( Reassigned) 6 minute walk at follow up Rescue inhaler as needed. Please contact office for sooner follow up if symptoms do not improve or worsen or seek emergency care   ILD (interstitial lung disease) ([place]) Stable interval No immunosuppression at present Plan: Annual CT scan as scheduled 11/22/2017 PFT's/ 6 minute walk in 6 months Follow up with Dr. Halford Chessman in 6 months or before as needed.  GERD (gastroesophageal reflux disease) Compliant with Zantac  No acute issues Plan Continue with Zantac daily as you have been doing.  H/O asbestos exposure CT scan 11/22/2017 as is scheduled    Magdalen Spatz, NP 11/11/2017  4:43 PM

## 2017-11-11 NOTE — Telephone Encounter (Signed)
Please let patient know we will schedule him for 6 months vs 1 year .  Let him know I want follow up to be closer to what Dr. Ashok Cordia used to do for him. Let him know  we will get PFT's with DLCO and 6 minute walk prior to that appointment Please schedule appointment with Dr. Halford Chessman for 6 months, and place order for PFT's and 6 minute walk. Thanks so much.

## 2017-11-11 NOTE — Assessment & Plan Note (Signed)
Stable interval No immunosuppression at present Plan: Annual CT scan as scheduled 11/22/2017 PFT's/ 6 minute walk in 6 months Follow up with Dr. Halford Chessman in 6 months or before as needed.

## 2017-11-11 NOTE — Assessment & Plan Note (Addendum)
Stable interval Did not feel Incruse helped him so he stopped taking it. Uses rescue inhaler rarely Continues with Wellness program 3 times a week. Plan: 6 month follow up with PFT with DLCO with Dr. Halford Chessman ( Reassigned) 6 minute walk at follow up Rescue inhaler as needed. Please contact office for sooner follow up if symptoms do not improve or worsen or seek emergency care

## 2017-11-11 NOTE — Assessment & Plan Note (Signed)
CT scan 11/22/2017 as is scheduled

## 2017-11-11 NOTE — Assessment & Plan Note (Signed)
Compliant with Zantac  No acute issues Plan Continue with Zantac daily as you have been doing.

## 2017-11-11 NOTE — Patient Instructions (Addendum)
It is good  to see you today. Please continue using your rescue inhaler as needed. Follow up in 6 months with PFT's and 6 minute walk at that time. Reassign to Dr. Halford Chessman  Call us if you have any changes. Note your daily symptoms > remember "red flags" for respiratory illness:  Increase in cough, increase in sputum production, increase in shortness of breath or activity tolerance. If you notice these symptoms, please call to be seen.   Please contact office for sooner follow up if symptoms do not improve or worsen or seek emergency care

## 2017-11-12 NOTE — Telephone Encounter (Signed)
Placed recall for 6 month follow up as well as PFT and 6 minute walk. Advised patient, nothing further needed.

## 2017-11-22 ENCOUNTER — Ambulatory Visit
Admission: RE | Admit: 2017-11-22 | Discharge: 2017-11-22 | Disposition: A | Discharge status: Home / Self Care | Payer: Medicare Other | Source: Ambulatory Visit | Attending: Geriatric Medicine | Admitting: Geriatric Medicine

## 2017-11-22 DIAGNOSIS — I712 Thoracic aortic aneurysm, without rupture: Secondary | ICD-10-CM

## 2017-11-22 DIAGNOSIS — I7121 Aneurysm of the ascending aorta, without rupture: Secondary | ICD-10-CM

## 2017-12-04 ENCOUNTER — Other Ambulatory Visit: Payer: Self-pay

## 2017-12-04 MED ORDER — RANITIDINE HCL 150 MG PO TABS: 150.0000 mg | ORAL_TABLET | Freq: Every day | ORAL | 1 refills | Status: DC

## 2018-05-05 ENCOUNTER — Other Ambulatory Visit: Payer: Self-pay | Admitting: Pulmonary Disease

## 2018-07-28 ENCOUNTER — Encounter: Payer: Self-pay | Admitting: Podiatry

## 2018-07-28 ENCOUNTER — Ambulatory Visit: Payer: Medicare Other | Admitting: Podiatry

## 2018-07-28 VITALS — BP 135/85 | HR 93 | Ht 76.0 in | Wt 251.0 lb

## 2018-07-28 DIAGNOSIS — B353 Tinea pedis: Secondary | ICD-10-CM | POA: Diagnosis not present

## 2018-07-28 DIAGNOSIS — M79672 Pain in left foot: Secondary | ICD-10-CM | POA: Diagnosis not present

## 2018-07-28 DIAGNOSIS — L309 Dermatitis, unspecified: Secondary | ICD-10-CM | POA: Diagnosis not present

## 2018-07-28 DIAGNOSIS — R238 Other skin changes: Secondary | ICD-10-CM

## 2018-07-28 MED ORDER — CLOTRIMAZOLE-BETAMETHASONE 1-0.05 % EX CREA: TOPICAL_CREAM | CUTANEOUS | 0 refills | Status: DC

## 2018-07-28 NOTE — Progress Notes (Signed)
  Subjective:  Patient ID: [name], male    DOB: 1935-11-27,  MRN: [id number]  Chief Complaint  Patient presents with  . Tinea Pedis    Blister started on left 3rd toe about 10 days ago and it has now spread with additional blisters, reddness and peeling skin to the other toes and looks like it may be starting on my right 2nd toe.  I don't have much feeling in my feet but have noticed some buring and itching     82 y.o. male presents with the above complaint. Above history confirmed with patient.   Review of Systems: Negative except as noted in the HPI. Denies N/V/F/Ch.  Past Medical History:  Diagnosis Date  . AAA (abdominal aortic aneurysm) ([place])   . Allergic rhinitis   . BPH (benign prostatic hypertrophy)   . Colon polyp   . Hypertension   . Osteoarthritis     Current Outpatient Medications:  .  albuterol (PROAIR HFA) 108 (90 Base) MCG/ACT inhaler, Inhale 2 puffs into the lungs every 6 (six) hours as needed for wheezing or shortness of breath., Disp: , Rfl:  .  amLODipine (NORVASC) 10 MG tablet, Take 10 mg by mouth daily., Disp: , Rfl:  .  fluticasone (FLONASE) 50 MCG/ACT nasal spray, 2 sprays each nostril daily as needed, Disp: , Rfl:  .  hydrochlorothiazide (HYDRODIURIL) 25 MG tablet, 1/2 daily, Disp: , Rfl:  .  KRILL OIL PO, Take by mouth daily., Disp: , Rfl:  .  losartan (COZAAR) 50 MG tablet, 1 tab daily, Disp: , Rfl:  .  Multiple Vitamin (MULTIVITAMIN) tablet, Take 1 tablet by mouth daily., Disp: , Rfl:  .  ranitidine (ZANTAC) 150 MG tablet, TAKE 1 TABLET BY MOUTH AT  BEDTIME, Disp: 90 tablet, Rfl: 1 .  aspirin 81 MG tablet, Take 81 mg by mouth daily., Disp: , Rfl:  .  clotrimazole-betamethasone (LOTRISONE) cream, Apply fingertip amount to affected areas daily, Disp: 30 g, Rfl: 0 .  triamcinolone (KENALOG) 0.025 % cream, As needed, Disp: , Rfl:   Social History   Tobacco Use  Smoking Status Former Smoker  . Packs/day: 1.00  . Years: 10.00  . Pack years: 10.00    . Types: Cigarettes, Pipe, Cigars  . Start date: 10/02/1955  . Last attempt to quit: 10/01/1965  . Years since quitting: 52.8  Smokeless Tobacco Former Systems developer  . Types: Chew    No Known Allergies Objective:   Vitals:   07/28/18 1545  BP: 135/85  Pulse: 93   Body mass index is 30.55 kg/m. Constitutional Well developed. Well nourished.  Vascular Dorsalis pedis pulses palpable bilaterally. Posterior tibial pulses palpable bilaterally. Capillary refill normal to all digits.  No cyanosis or clubbing noted. Pedal hair growth normal.  Neurologic Normal speech. Oriented to person, place, and time. Epicritic sensation to light touch grossly present bilaterally.  Dermatologic Nails well groomed and normal in appearance. No open wounds. Vesicular blistering scaling lesion with macerated interspaces left.  Orthopedic: Normal joint ROM without pain or crepitus bilaterally. No visible deformities. No bony tenderness.   Radiographs: None Assessment:   1. Tinea pedis of left foot   2. Vesicular rash   3. Dermatitis   4. Pain in left foot    Plan:  Patient was evaluated and treated and all questions answered.  Inflammatory Tinea -Educated on etiology. -Discussed self-care -Rx Lotrisone  No follow-ups on file.

## 2018-08-11 ENCOUNTER — Ambulatory Visit: Payer: Medicare Other | Admitting: Podiatry

## 2018-08-11 DIAGNOSIS — B353 Tinea pedis: Secondary | ICD-10-CM

## 2018-08-11 DIAGNOSIS — R238 Other skin changes: Secondary | ICD-10-CM | POA: Diagnosis not present

## 2018-08-11 MED ORDER — KETOCONAZOLE 2 % EX CREA: TOPICAL_CREAM | CUTANEOUS | 0 refills | Status: DC

## 2018-08-11 NOTE — Progress Notes (Signed)
  Subjective:  Patient ID: [name], male    DOB: 1936/09/10,  MRN: [id number]  Chief Complaint  Patient presents with  . Tinea Pedis    F/U L tinea Pt. states," it's better than it was; 90% better." Tx: lotrisone    82 y.o. male presents with the above complaint. Above history confirmed with patient.  Review of Systems: Negative except as noted in the HPI. Denies N/V/F/Ch.  Past Medical History:  Diagnosis Date  . AAA (abdominal aortic aneurysm) ([place])   . Allergic rhinitis   . BPH (benign prostatic hypertrophy)   . Colon polyp   . Hypertension   . Osteoarthritis     Current Outpatient Medications:  .  albuterol (PROAIR HFA) 108 (90 Base) MCG/ACT inhaler, Inhale 2 puffs into the lungs every 6 (six) hours as needed for wheezing or shortness of breath., Disp: , Rfl:  .  amLODipine (NORVASC) 10 MG tablet, Take 10 mg by mouth daily., Disp: , Rfl:  .  clotrimazole-betamethasone (LOTRISONE) cream, Apply fingertip amount to affected areas daily, Disp: 30 g, Rfl: 0 .  fluticasone (FLONASE) 50 MCG/ACT nasal spray, 2 sprays each nostril daily as needed, Disp: , Rfl:  .  hydrochlorothiazide (HYDRODIURIL) 25 MG tablet, 1/2 daily, Disp: , Rfl:  .  KRILL OIL PO, Take by mouth daily., Disp: , Rfl:  .  losartan (COZAAR) 50 MG tablet, 1 tab daily, Disp: , Rfl:  .  Multiple Vitamin (MULTIVITAMIN) tablet, Take 1 tablet by mouth daily., Disp: , Rfl:  .  ranitidine (ZANTAC) 150 MG tablet, TAKE 1 TABLET BY MOUTH AT  BEDTIME, Disp: 90 tablet, Rfl: 1 .  triamcinolone (KENALOG) 0.025 % cream, As needed, Disp: , Rfl:  .  ketoconazole (NIZORAL) 2 % cream, Apply 1 fingertip amount to each foot daily., Disp: 30 g, Rfl: 0  Social History   Tobacco Use  Smoking Status Former Smoker  . Packs/day: 1.00  . Years: 10.00  . Pack years: 10.00  . Types: Cigarettes, Pipe, Cigars  . Start date: 10/02/1955  . Last attempt to quit: 10/01/1965  . Years since quitting: 52.8  Smokeless Tobacco Former Systems developer  .  Types: Chew    No Known Allergies Objective:   There were no vitals filed for this visit. There is no height or weight on file to calculate BMI. Constitutional Well developed. Well nourished.  Vascular Dorsalis pedis pulses palpable bilaterally. Posterior tibial pulses palpable bilaterally. Capillary refill normal to all digits.  No cyanosis or clubbing noted. Pedal hair growth normal.  Neurologic Normal speech. Oriented to person, place, and time. Epicritic sensation to light touch grossly present bilaterally.  Dermatologic Nails well groomed and normal in appearance. No open wounds. Residual xerotic scaling plaques  Orthopedic: Normal joint ROM without pain or crepitus bilaterally. No visible deformities. No bony tenderness.   Radiographs: None Assessment:   1. Tinea pedis of left foot   2. Vesicular rash    Plan:  Patient was evaluated and treated and all questions answered.  Tinea with resolved inflammation -Educated on etiology. -Discussed self-care -D/c lotrisone. Start ketoconazole only.  Return if symptoms worsen or fail to improve.

## 2018-10-20 ENCOUNTER — Other Ambulatory Visit: Payer: Self-pay | Admitting: Geriatric Medicine

## 2018-10-20 DIAGNOSIS — J8410 Pulmonary fibrosis, unspecified: Secondary | ICD-10-CM

## 2018-10-21 ENCOUNTER — Ambulatory Visit
Admission: RE | Admit: 2018-10-21 | Discharge: 2018-10-21 | Disposition: A | Discharge status: Home / Self Care | Payer: Medicare Other | Source: Ambulatory Visit | Attending: Geriatric Medicine | Admitting: Geriatric Medicine

## 2018-10-21 DIAGNOSIS — J8410 Pulmonary fibrosis, unspecified: Secondary | ICD-10-CM

## 2018-10-27 ENCOUNTER — Other Ambulatory Visit: Payer: Self-pay | Admitting: Geriatric Medicine

## 2018-10-27 ENCOUNTER — Other Ambulatory Visit (HOSPITAL_COMMUNITY): Payer: Self-pay | Admitting: Geriatric Medicine

## 2018-10-27 DIAGNOSIS — R0789 Other chest pain: Secondary | ICD-10-CM

## 2018-10-28 ENCOUNTER — Telehealth (HOSPITAL_COMMUNITY): Payer: Self-pay | Admitting: *Deleted

## 2018-10-28 ENCOUNTER — Encounter (HOSPITAL_COMMUNITY): Payer: Medicare Other

## 2018-10-28 NOTE — Telephone Encounter (Signed)
Left message on voicemail per DPR in reference to upcoming appointment scheduled on 10/31/18 at 24 with detailed instructions given per Myocardial Perfusion Study Information Sheet for the test. LM to arrive 15 minutes early, and that it is imperative to arrive on time for appointment to keep from having the test rescheduled. If you need to cancel or reschedule your appointment, please call the office within 24 hours of your appointment. Failure to do so may result in a cancellation of your appointment, and a $50 no show fee. Phone number given for call back for any questions. Mikela Senn, Ranae [place]

## 2018-10-31 ENCOUNTER — Ambulatory Visit (HOSPITAL_COMMUNITY): Payer: Medicare Other | Attending: Cardiology

## 2018-10-31 DIAGNOSIS — R0789 Other chest pain: Secondary | ICD-10-CM

## 2018-10-31 LAB — MYOCARDIAL PERFUSION IMAGING
LVDIAVOL: 75 mL (ref 62–150)
LVSYSVOL: 34 mL
NUC STRESS TID: 0.87
Peak HR: 108 {beats}/min
Rest HR: 97 {beats}/min
SDS: 2
SRS: 7
SSS: 14

## 2018-10-31 MED ORDER — REGADENOSON 0.4 MG/5ML IV SOLN
0.4000 mg | Freq: Once | INTRAVENOUS | Status: AC
  Administered 2018-10-31: 0.4 mg via INTRAVENOUS

## 2018-10-31 MED ORDER — TECHNETIUM TC 99M TETROFOSMIN IV KIT
9.6000 | PACK | Freq: Once | INTRAVENOUS | Status: AC | PRN
  Administered 2018-10-31: 9.6 via INTRAVENOUS
  Filled 2018-10-31: qty 10

## 2018-10-31 MED ORDER — TECHNETIUM TC 99M TETROFOSMIN IV KIT
30.4000 | PACK | Freq: Once | INTRAVENOUS | Status: AC | PRN
  Administered 2018-10-31: 30.4 via INTRAVENOUS
  Filled 2018-10-31: qty 31

## 2018-11-03 ENCOUNTER — Other Ambulatory Visit: Payer: Self-pay

## 2018-11-03 MED ORDER — RANITIDINE HCL 150 MG PO TABS: 150.0000 mg | ORAL_TABLET | Freq: Every day | ORAL | 1 refills | Status: DC

## 2018-12-25 ENCOUNTER — Ambulatory Visit: Payer: Medicare Other | Admitting: Pulmonary Disease

## 2019-01-13 ENCOUNTER — Other Ambulatory Visit: Payer: Self-pay | Admitting: *Deleted

## 2019-01-23 ENCOUNTER — Telehealth: Payer: Self-pay

## 2019-01-23 NOTE — Telephone Encounter (Signed)
Attempted to contact pt to let him know this information but unable to reach pt. Left message for pt to return call. I have pended the Rx that needs to be sent in. When pt calls back and we get pharmacy verified to where the Rx needs to be sent to, we will send in Rx for pt.

## 2019-01-23 NOTE — Telephone Encounter (Signed)
Omeprazole 20 mg daily would be fine. Please order. Thanks.

## 2019-01-23 NOTE — Telephone Encounter (Signed)
Received form from [facility] stating zantac has been pulled from the market.   Alternatives: Famotodine 73m/40, Pantoprazole 20/465m or Omeprazole 20/4070m  TN please advise which medication we can switch patient to. Please route message back to triage as I am leaving early today.

## 2019-01-27 NOTE — Telephone Encounter (Signed)
LMCTB

## 2019-01-28 MED ORDER — OMEPRAZOLE 20 MG PO CPDR: 20.0000 mg | DELAYED_RELEASE_CAPSULE | Freq: Every day | ORAL | 2 refills | Status: DC

## 2019-01-28 NOTE — Telephone Encounter (Signed)
Verified pharmacy [facility] and sent Omeprazole that was pended in patient chart. Nothing further needed.

## 2019-03-16 ENCOUNTER — Ambulatory Visit: Payer: Medicare Other | Admitting: Nurse Practitioner

## 2019-03-20 ENCOUNTER — Other Ambulatory Visit (HOSPITAL_COMMUNITY): Payer: Self-pay | Admitting: Geriatric Medicine

## 2019-03-20 DIAGNOSIS — R600 Localized edema: Secondary | ICD-10-CM

## 2019-03-23 ENCOUNTER — Telehealth (HOSPITAL_COMMUNITY): Payer: Self-pay

## 2019-03-23 NOTE — Telephone Encounter (Signed)

## 2019-03-24 ENCOUNTER — Ambulatory Visit (HOSPITAL_COMMUNITY): Payer: Medicare Other | Attending: Internal Medicine

## 2019-03-24 ENCOUNTER — Other Ambulatory Visit: Payer: Self-pay

## 2019-03-24 DIAGNOSIS — R600 Localized edema: Secondary | ICD-10-CM | POA: Diagnosis present

## 2019-04-06 ENCOUNTER — Ambulatory Visit: Payer: Medicare Other | Admitting: Nurse Practitioner

## 2019-04-06 ENCOUNTER — Encounter: Payer: Self-pay | Admitting: Nurse Practitioner

## 2019-04-06 ENCOUNTER — Ambulatory Visit (INDEPENDENT_AMBULATORY_CARE_PROVIDER_SITE_OTHER): Payer: Medicare Other

## 2019-04-06 ENCOUNTER — Other Ambulatory Visit: Payer: Self-pay

## 2019-04-06 ENCOUNTER — Telehealth: Payer: Self-pay | Admitting: Nurse Practitioner

## 2019-04-06 VITALS — BP 130/74 | HR 105 | Temp 97.8°F | Ht 75.0 in | Wt 245.8 lb

## 2019-04-06 DIAGNOSIS — R0602 Shortness of breath: Secondary | ICD-10-CM

## 2019-04-06 DIAGNOSIS — J849 Interstitial pulmonary disease, unspecified: Secondary | ICD-10-CM

## 2019-04-06 NOTE — Telephone Encounter (Signed)
Call returned to patient, made aware of CXR results:  Notes recorded by Fenton Foy, NP on 04/06/2019 at 3:24 PM EDT  Please call to let patient know that his chest x ray does show improvement. No pneumonia noted.  Voiced understanding. Nothing further is needed at this time.

## 2019-04-06 NOTE — Progress Notes (Signed)
_0  ID: [name], male    DOB: November 19, 1935, 83 y.o.   MRN: [id number]  Chief Complaint  Patient presents with  . Follow-up    Referring provider: [name], MD  HPI 83 year old male former smoker with emphysema, alpha-1 antitrypsin phenotype MM, ILD, hypoxic respiratory failure, asbestos exposure, allergic rhinitis, GERD who was previously followed by Dr. Ashok Cordia. Maintenance: Incruse    Tests: PFT 09/12/16: FVC 3.74 L (70%) FEV1 3.19 L (92%) FEV1/FVC 0.85 FEF 25-75 4.51 L (186%) DLCO corrected 40% (hgb 15.8) 06/12/16: FVC 3.40 L (71%) FEV1 2.90 L (84%) FEV1/FVC 0.85 FEF 25-75 3.85 L (158%) negative bronchodilator response TLC 5.03 L (63%) RV 57% ERV 102% DLCO corrected 34% (Hgb 16.2)  6MWT 09/12/16: Walked 336 meters / Baseline Sat 100% on RA / Nadir Sat 90% on RA (did not require oxygen) 06/12/16: Walked 96 meters / Baseline Sat 93% on RA / Nadir Sat 85% on RA @ 1:20 (required 3 L/m continuous to maintain saturation &BP increased to max 200/110 w/o symptoms)  IMAGING HRCT CHEST W/O 04/24/16:Borderline cardiomegaly with mild aneurysmal dilatation of ascending aorta measuring 4.5 cm. Bilateral calcified pleural plaques noted. Apical predominant centrilobular &paraseptalemphysema. Cystic changes primarily in the apices likely more secondary to underlying emphysema. No pleural effusion. Patient does have some air trapping on expiratory phase. Diffuse bilateral intralobular septal thickening with some bilateral traction bronchiectasis. Findings appear more pronounced in the apices with some suggestion of sparing in the sulcibilaterally. No suggestion of honeycomb changes. Mediastinal and hilar adenopathy with largest lymph node precarinal measuring 1.3 cm. No axillary lymphadenopathy. We compared the patient's CT imaging from 2010 cystic changes the  apices appear more pronounced in there is some intralobular septal thickening even on that CT scan.  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 Diffuse bronchial wall thickening with moderate centrilobular and paraseptal emphysema; imaging findings suggestive of underlying COPD. Dilatation of the pulmonic trunk (4.5 cm in diameter), concerning for associated pulmonary arterial hypertension. Aortic atherosclerosis, in addition to left main and 3 vessel coronary artery disease. There are calcifications of the aortic valve. Echocardiographic correlation for evaluation of potential valvular dysfunction may be warranted if clinically indicated. Aortic Atherosclerosis and Emphysema.  CARDIAC TTE (04/27/16):LV with mild LVH &EF 60-65%. Grade 1 diastolic dysfunction. LA & RA normal in size. RV normal in size and function. Very mild aortic stenosis without regurgitation. Trivial mitral regurgitation. No pulmonic regurgitation. No tricuspid regurgitation. No pericardial effusion.  Echo 03/24/19:1. The left ventricle has normal systolic function with an ejection fraction of 60-65%. The cavity size was normal. Left ventricular diastolic Doppler parameters are indeterminate. No evidence of left ventricular regional wall motion abnormalities.  2. The right ventricle has normal systolic function. The cavity was normal. There is no increase in right ventricular wall thickness.  3. The mitral valve is abnormal. Mild thickening of the mitral valve leaflet.  4. The aortic valve is abnormal. Mild calcification of the aortic valve. Aortic valve regurgitation is  trivial by color flow Doppler. Mild stenosis of the aortic valve.  5. The interatrial septum was not well visualized.   LABS 06/13/16 Alpha-1 antitrypsin: MM (198)  04/26/16 Alpha-1 antitrypsin: 251 CRP: 2.4 ESR: 77 ANA: Negative Hypersensitivity  pneumonitis panel: Negative Anti-CCP: 92 RF: 24 Centromere Ab Screen: <0.2 Anti-Jo 1: <0.2 DS DNA Ab: 1 RNP Ab: <0.2 Smith Ab: <0.2 Chromatin Ab: <0.2 SCL-70: <0.2 SSA: <0.2 SSB: <0.2   OV 04/07/19 - Follow up  PulmonaryEmphysema:Alpha-1 antitrypsin phenotype MM. Started on Incruse at last appointment.Uses ventolin as needed. States that inhalers are working well for him.   YTK:PTWSFKCLE to RA vs Asbestos. Reports shortness of breath is stable. He reports he does cough some in the morning but no other significant cough or wheezing. Patient was sick with flu like illness in March and states that it has been a slow recovery. He was treated by PCP. He has also recently underwent cardiac workup including nuclear stress test and ECHO.   Chronic Hypoxic Respiratory Failure:Started on oxygen at 3 L/m with portable concentrator at last appointment. After illness in March patient has been using oxygen continuously. He checks his O2 sats frequently and uses oxygen to keep sats above 88%. Patient was doing well with pulmonary rehab program, but it was cancelled due to current pandemic. He has been sedentary since this time and feels deconditioned. He will restart pulmonary rehab when it reopens.   H/O Asbestos Exposure: Patient does have pleural plaques on ChestCT imaging.  ChronicAllergic Rhinitis:Previously utilizing Flonase intermittently. He reports only rare nasal congestion & drainage.  GERD:patient is taking prilosec. Reports he is compliant with medication. No reflux or dyspepsia. No morning brash water taste.    No Known Allergies  Immunization History  Administered Date(s) Administered  .  Influenza Split 10/21/2015  . Influenza, High Dose Seasonal PF 06/13/2016, 07/18/2018  . Influenza-Unspecified 07/17/2018  . Pneumococcal Conjugate-13 10/15/2014  . Pneumococcal Polysaccharide-23 10/23/2002  . Td 11/08/2017    Past Medical History:  Diagnosis Date  . AAA (abdominal aortic aneurysm) ([place])   . Allergic rhinitis   . BPH (benign prostatic hypertrophy)   . Colon polyp   . Hypertension   . Osteoarthritis     Tobacco History: Social History   Tobacco Use  Smoking Status Former Smoker  . Packs/day: 1.00  . Years: 10.00  . Pack years: 10.00  . Types: Cigarettes, Pipe, Cigars  . Start date: 10/02/1955  . Quit date: 10/01/1965  . Years since quitting: 53.5  Smokeless Tobacco Former Systems developer  . Types: Chew   Counseling given: Yes   Outpatient Encounter Medications as of 04/06/2019  Medication Sig  . amLODipine (NORVASC) 10 MG tablet Take 10 mg by mouth daily.  . fluticasone (FLONASE) 50 MCG/ACT nasal spray 2 sprays each nostril daily as needed  . furosemide (LASIX) 80 MG tablet Take 1 tablet by mouth 2 (two) times a day.  Marland Kitchen KRILL OIL PO Take by mouth daily.  Marland Kitchen losartan (COZAAR) 50 MG tablet 1 tab daily  . Multiple Vitamin (MULTIVITAMIN) tablet Take 1 tablet by mouth daily.  Marland Kitchen omeprazole (PRILOSEC) 20 MG capsule Take 1 capsule (20 mg total) by mouth daily. (Patient taking differently: Take 20 mg by mouth daily as needed. )  . ranitidine (ZANTAC) 150 MG tablet Take 1 tablet by mouth daily.  . [DISCONTINUED] albuterol (PROAIR HFA) 108 (90 Base) MCG/ACT inhaler Inhale 2 puffs into the lungs every 6 (six) hours as needed for wheezing or shortness of breath.  . [DISCONTINUED] clotrimazole-betamethasone (LOTRISONE) cream Apply fingertip amount to affected areas daily (Patient not taking: Reported on 04/06/2019)  . [DISCONTINUED] hydrochlorothiazide (HYDRODIURIL) 25 MG tablet 1/2 daily  . [DISCONTINUED] ketoconazole (NIZORAL) 2 % cream Apply 1 fingertip amount  to each foot daily.  (Patient not taking: Reported on 04/06/2019)  . [DISCONTINUED] losartan (COZAAR) 50 MG tablet Take 1 tablet by mouth daily.  . [DISCONTINUED] ranitidine (ZANTAC) 150 MG tablet Take 1 tablet (150 mg total) by mouth at bedtime. (Patient not taking: Reported on 04/06/2019)  . [DISCONTINUED] triamcinolone (KENALOG) 0.025 % cream As needed   No facility-administered encounter medications on file as of 04/06/2019.      Review of Systems  Review of Systems  Constitutional: Negative.  Negative for chills and fever.  HENT: Negative.   Respiratory: Negative for cough, shortness of breath and wheezing.   Cardiovascular: Negative.  Negative for chest pain, palpitations and leg swelling.  Gastrointestinal: Negative.   Allergic/Immunologic: Negative.   Neurological: Negative.   Psychiatric/Behavioral: Negative.        Physical Exam  BP 130/74 (BP Location: Right Arm, Patient Position: Sitting, Cuff Size: Normal)   Pulse (!) 105   Temp 97.8 F (36.6 C)   Ht _0  (1.905 m)   Wt 245 lb 12.8 oz (111.5 kg)   SpO2 97% Comment: 3L O2 pulse  BMI 30.72 kg/m   Wt Readings from Last 5 Encounters:  04/06/19 245 lb 12.8 oz (111.5 kg)  10/31/18 251 lb (113.9 kg)  07/28/18 251 lb (113.9 kg)  11/11/17 251 lb 3.2 oz (113.9 kg)  09/12/16 246 lb (111.6 kg)     Physical Exam Vitals signs and nursing note reviewed.  Constitutional:      General: He is not in acute distress.    Appearance: He is well-developed.  Cardiovascular:     Rate and Rhythm: Normal rate and regular rhythm.  Pulmonary:     Effort: Pulmonary effort is normal. No respiratory distress.     Breath sounds: Normal breath sounds. No wheezing or rhonchi.  Musculoskeletal:        General: No swelling.  Skin:    General: Skin is warm and dry.  Neurological:     Mental Status: He is alert and oriented to person, place, and time.     Imaging: Dg Chest 2 View  Result Date: 04/06/2019 CLINICAL DATA:  83 year old male with a history  of shortness of breath EXAM: CHEST - 2 VIEW COMPARISON:  Chest x-ray 04/17/2016, multiple interval CT studies most recent 10/21/2018 FINDINGS: Cardiomediastinal silhouette unchanged in size and contour. Similar appearance of reticulonodular opacities throughout the lungs. This includes the nodularity just above the minor fissure. Improved aeration at the right and left lung bases compared to the prior. No interlobular septal thickening. No pneumothorax or pleural effusion. No confluent airspace disease. No displaced fracture.  Degenerative changes of the spine. IMPRESSION: Redemonstration of interstitial lung disease, with overall improved aeration compared to the prior plain film of 2017 and no definite radiographic evidence of acute cardiopulmonary disease. Electronically Signed   By: Corrie Mckusick D.O.   On: 04/06/2019 15:01     Assessment & Plan:   ILD (interstitial lung disease) ([place]) PulmonaryEmphysema:Alpha-1 antitrypsin phenotype MM. Started on Incruse at last appointment.Uses ventolin as needed. States that inhalers are working well for him.   PJK:DTOIZTIWP to RA vs Asbestos. Reports shortness of breath is stable. He reports he does cough some in the morning but no other significant cough or wheezing. Patient was sick with flu like illness in March and states that it has been a slow recovery. He was treated by PCP. He has also recently underwent cardiac workup including nuclear stress test and ECHO.   Chronic Hypoxic  Respiratory Failure:Started on oxygen at 3 L/m with portable concentrator at last appointment. After illness in March patient has been using oxygen continuously. He checks his O2 sats frequently and uses oxygen to keep sats above 88%. Patient was doing well with pulmonary rehab program, but it was cancelled due to current pandemic. He has been sedentary since this time and feels deconditioned. He will restart pulmonary rehab when it reopens.   H/O Asbestos Exposure:  Patient does have pleural plaques on ChestCT imaging.  ChronicAllergic Rhinitis:Previously utilizing Flonase intermittently. He reports only rare nasal congestion & drainage.  GERD:patient is taking prilosec. Reports he is compliant with medication. No reflux or dyspepsia. No morning brash water taste.  Plan: Patient Instructions  Will order chest xray and call with results Continue Incruse daily Continue O2 as needed to keep sats above 88%   Follow up: 1-2 month follow up with PFT with DLCO with Dr. Halford Chessman to establish care 6 minute walk at follow up Please contact office for sooner follow up if symptoms do not improve or worsen or seek emergency care        Fenton Foy, NP 04/07/2019

## 2019-04-06 NOTE — Patient Instructions (Signed)
Will order chest xray and call with results Continue Incruse daily Continue O2 as needed to keep sats above 88%   Follow up: 1-2 month follow up with PFT with DLCO with Dr. Halford Chessman to establish care 6 minute walk at follow up Please contact office for sooner follow up if symptoms do not improve or worsen or seek emergency care

## 2019-04-07 ENCOUNTER — Encounter: Payer: Self-pay | Admitting: Nurse Practitioner

## 2019-04-07 NOTE — Assessment & Plan Note (Signed)
PulmonaryEmphysema:Alpha-1 antitrypsin phenotype MM. Started on Incruse at last appointment.Uses ventolin as needed. States that inhalers are working well for him.   OZY:[id number] to RA vs Asbestos. Reports shortness of breath is stable. He reports he does cough some in the morning but no other significant cough or wheezing. Patient was sick with flu like illness in March and states that it has been a slow recovery. He was treated by PCP. He has also recently underwent cardiac workup including nuclear stress test and ECHO.   Chronic Hypoxic Respiratory Failure:Started on oxygen at 3 L/m with portable concentrator at last appointment. After illness in March patient has been using oxygen continuously. He checks his O2 sats frequently and uses oxygen to keep sats above 88%. Patient was doing well with pulmonary rehab program, but it was cancelled due to current pandemic. He has been sedentary since this time and feels deconditioned. He will restart pulmonary rehab when it reopens.   H/O Asbestos Exposure: Patient does have pleural plaques on ChestCT imaging.  ChronicAllergic Rhinitis:Previously utilizing Flonase intermittently. He reports only rare nasal congestion & drainage.  GERD:patient is taking prilosec. Reports he is compliant with medication. No reflux or dyspepsia. No morning brash water taste.  Plan: Patient Instructions  Will order chest xray and call with results Continue Incruse daily Continue O2 as needed to keep sats above 88%   Follow up: 1-2 month follow up with PFT with DLCO with Dr. Halford Chessman to establish care 6 minute walk at follow up Please contact office for sooner follow up if symptoms do not improve or worsen or seek emergency care

## 2019-05-22 NOTE — Progress Notes (Signed)
Reviewed and agree with assessment/plan.   Ainslee Sou, MD [place] [facility] 09/26/2016, 12:24 PM Pager:  [phone]  

## 2019-06-09 ENCOUNTER — Other Ambulatory Visit: Payer: Self-pay

## 2019-06-09 ENCOUNTER — Ambulatory Visit: Payer: Medicare Other

## 2019-06-09 DIAGNOSIS — R0602 Shortness of breath: Secondary | ICD-10-CM

## 2019-06-09 DIAGNOSIS — J849 Interstitial pulmonary disease, unspecified: Secondary | ICD-10-CM

## 2019-07-08 ENCOUNTER — Telehealth: Payer: Self-pay | Admitting: Pulmonary Disease

## 2019-07-08 NOTE — Telephone Encounter (Signed)
Call returned to patient, he states he is aware of his 6MW results he is wanting to schedule his PFT and OV. I made him aware the first available is in December. He states he will call his PCP and see if he can get it done sooner some where else. I made him aware to give [place] a call back if things do not fall through. He was made aware he will have to be tested for covid prior to getting PFT. Nothing further needed at this time.

## 2019-11-12 DIAGNOSIS — I5033 Acute on chronic diastolic (congestive) heart failure: Secondary | ICD-10-CM | POA: Diagnosis not present

## 2019-11-12 DIAGNOSIS — I340 Nonrheumatic mitral (valve) insufficiency: Secondary | ICD-10-CM | POA: Diagnosis not present

## 2019-11-12 DIAGNOSIS — I10 Essential (primary) hypertension: Secondary | ICD-10-CM | POA: Diagnosis not present

## 2019-11-12 DIAGNOSIS — I4892 Unspecified atrial flutter: Secondary | ICD-10-CM

## 2019-11-12 DIAGNOSIS — J9611 Chronic respiratory failure with hypoxia: Secondary | ICD-10-CM

## 2019-11-12 DIAGNOSIS — I361 Nonrheumatic tricuspid (valve) insufficiency: Secondary | ICD-10-CM | POA: Diagnosis not present

## 2019-11-12 DIAGNOSIS — I352 Nonrheumatic aortic (valve) stenosis with insufficiency: Secondary | ICD-10-CM

## 2019-11-12 DIAGNOSIS — R778 Other specified abnormalities of plasma proteins: Secondary | ICD-10-CM | POA: Diagnosis not present

## 2019-11-13 ENCOUNTER — Encounter (HOSPITAL_COMMUNITY): Payer: Self-pay | Admitting: Internal Medicine

## 2019-11-13 ENCOUNTER — Inpatient Hospital Stay (HOSPITAL_COMMUNITY)
Admission: AD | Admit: 2019-11-13 | Discharge: 2019-11-20 | DRG: 280 | Disposition: A | Discharge status: Home Health | Payer: Medicare Other | Source: Other Acute Inpatient Hospital | Attending: Internal Medicine | Admitting: Internal Medicine

## 2019-11-13 DIAGNOSIS — I2510 Atherosclerotic heart disease of native coronary artery without angina pectoris: Secondary | ICD-10-CM | POA: Diagnosis not present

## 2019-11-13 DIAGNOSIS — I5033 Acute on chronic diastolic (congestive) heart failure: Secondary | ICD-10-CM | POA: Diagnosis not present

## 2019-11-13 DIAGNOSIS — Z9981 Dependence on supplemental oxygen: Secondary | ICD-10-CM

## 2019-11-13 DIAGNOSIS — J449 Chronic obstructive pulmonary disease, unspecified: Secondary | ICD-10-CM | POA: Diagnosis present

## 2019-11-13 DIAGNOSIS — Z96652 Presence of left artificial knee joint: Secondary | ICD-10-CM | POA: Diagnosis present

## 2019-11-13 DIAGNOSIS — I10 Essential (primary) hypertension: Secondary | ICD-10-CM | POA: Diagnosis present

## 2019-11-13 DIAGNOSIS — Z20822 Contact with and (suspected) exposure to covid-19: Secondary | ICD-10-CM | POA: Diagnosis present

## 2019-11-13 DIAGNOSIS — I5043 Acute on chronic combined systolic (congestive) and diastolic (congestive) heart failure: Secondary | ICD-10-CM | POA: Diagnosis present

## 2019-11-13 DIAGNOSIS — J849 Interstitial pulmonary disease, unspecified: Secondary | ICD-10-CM

## 2019-11-13 DIAGNOSIS — I110 Hypertensive heart disease with heart failure: Secondary | ICD-10-CM | POA: Diagnosis present

## 2019-11-13 DIAGNOSIS — I483 Typical atrial flutter: Secondary | ICD-10-CM | POA: Diagnosis not present

## 2019-11-13 DIAGNOSIS — I714 Abdominal aortic aneurysm, without rupture: Secondary | ICD-10-CM | POA: Diagnosis present

## 2019-11-13 DIAGNOSIS — M1990 Unspecified osteoarthritis, unspecified site: Secondary | ICD-10-CM | POA: Diagnosis present

## 2019-11-13 DIAGNOSIS — I428 Other cardiomyopathies: Secondary | ICD-10-CM | POA: Diagnosis not present

## 2019-11-13 DIAGNOSIS — I5041 Acute combined systolic (congestive) and diastolic (congestive) heart failure: Secondary | ICD-10-CM | POA: Diagnosis present

## 2019-11-13 DIAGNOSIS — E785 Hyperlipidemia, unspecified: Secondary | ICD-10-CM | POA: Diagnosis present

## 2019-11-13 DIAGNOSIS — I272 Pulmonary hypertension, unspecified: Secondary | ICD-10-CM | POA: Diagnosis present

## 2019-11-13 DIAGNOSIS — J9611 Chronic respiratory failure with hypoxia: Secondary | ICD-10-CM | POA: Diagnosis present

## 2019-11-13 DIAGNOSIS — R0602 Shortness of breath: Secondary | ICD-10-CM

## 2019-11-13 DIAGNOSIS — E669 Obesity, unspecified: Secondary | ICD-10-CM | POA: Diagnosis present

## 2019-11-13 DIAGNOSIS — I4891 Unspecified atrial fibrillation: Secondary | ICD-10-CM | POA: Diagnosis not present

## 2019-11-13 DIAGNOSIS — R3914 Feeling of incomplete bladder emptying: Secondary | ICD-10-CM

## 2019-11-13 DIAGNOSIS — R778 Other specified abnormalities of plasma proteins: Secondary | ICD-10-CM | POA: Diagnosis not present

## 2019-11-13 DIAGNOSIS — I5021 Acute systolic (congestive) heart failure: Secondary | ICD-10-CM | POA: Diagnosis not present

## 2019-11-13 DIAGNOSIS — I214 Non-ST elevation (NSTEMI) myocardial infarction: Secondary | ICD-10-CM | POA: Diagnosis present

## 2019-11-13 DIAGNOSIS — D539 Nutritional anemia, unspecified: Secondary | ICD-10-CM | POA: Diagnosis not present

## 2019-11-13 DIAGNOSIS — I255 Ischemic cardiomyopathy: Secondary | ICD-10-CM | POA: Diagnosis not present

## 2019-11-13 DIAGNOSIS — I440 Atrioventricular block, first degree: Secondary | ICD-10-CM | POA: Diagnosis present

## 2019-11-13 DIAGNOSIS — N400 Enlarged prostate without lower urinary tract symptoms: Secondary | ICD-10-CM | POA: Diagnosis present

## 2019-11-13 DIAGNOSIS — I4892 Unspecified atrial flutter: Secondary | ICD-10-CM | POA: Diagnosis present

## 2019-11-13 DIAGNOSIS — Z7901 Long term (current) use of anticoagulants: Secondary | ICD-10-CM | POA: Diagnosis not present

## 2019-11-13 DIAGNOSIS — J8410 Pulmonary fibrosis, unspecified: Secondary | ICD-10-CM | POA: Diagnosis present

## 2019-11-13 DIAGNOSIS — I719 Aortic aneurysm of unspecified site, without rupture: Secondary | ICD-10-CM | POA: Diagnosis not present

## 2019-11-13 DIAGNOSIS — R338 Other retention of urine: Secondary | ICD-10-CM | POA: Diagnosis present

## 2019-11-13 DIAGNOSIS — Z77090 Contact with and (suspected) exposure to asbestos: Secondary | ICD-10-CM | POA: Diagnosis present

## 2019-11-13 DIAGNOSIS — J309 Allergic rhinitis, unspecified: Secondary | ICD-10-CM | POA: Diagnosis present

## 2019-11-13 DIAGNOSIS — Z79899 Other long term (current) drug therapy: Secondary | ICD-10-CM

## 2019-11-13 DIAGNOSIS — N401 Enlarged prostate with lower urinary tract symptoms: Secondary | ICD-10-CM | POA: Diagnosis present

## 2019-11-13 DIAGNOSIS — I471 Supraventricular tachycardia: Secondary | ICD-10-CM | POA: Diagnosis not present

## 2019-11-13 DIAGNOSIS — Z6830 Body mass index (BMI) 30.0-30.9, adult: Secondary | ICD-10-CM

## 2019-11-13 DIAGNOSIS — Z87891 Personal history of nicotine dependence: Secondary | ICD-10-CM

## 2019-11-13 LAB — LIPID PANEL
Cholesterol: 124 mg/dL (ref 0–200)
HDL: 37 mg/dL — ABNORMAL LOW (ref >40)
LDL Cholesterol: 76 mg/dL (ref 0–99)
Total CHOL/HDL Ratio: 3.4 RATIO
Triglycerides: 54 mg/dL (ref <150)
VLDL: 11 mg/dL (ref 0–40)

## 2019-11-13 LAB — TROPONIN I (HIGH SENSITIVITY)
Troponin I (High Sensitivity): 684 ng/L (ref <18)
Troponin I (High Sensitivity): 571 ng/L (ref <18)

## 2019-11-13 LAB — RESPIRATORY PANEL BY RT PCR (FLU A&B, COVID)
Influenza A by PCR: NEGATIVE
Influenza B by PCR: NEGATIVE
SARS Coronavirus 2 by RT PCR: NEGATIVE

## 2019-11-13 MED ORDER — SODIUM CHLORIDE 0.9 % WEIGHT BASED INFUSION: 1.0000 mL/kg/h | INTRAVENOUS | Status: DC

## 2019-11-13 MED ORDER — ASPIRIN EC 81 MG PO TBEC
81.0000 mg | DELAYED_RELEASE_TABLET | Freq: Every day | ORAL | Status: DC
  Administered 2019-11-13 – 2019-11-20 (×8): 81 mg via ORAL
  Filled 2019-11-13 (×9): qty 1

## 2019-11-13 MED ORDER — SODIUM CHLORIDE 0.9 % IV SOLN: 250.0000 mL | INTRAVENOUS | Status: DC | PRN

## 2019-11-13 MED ORDER — HEPARIN (PORCINE) 25000 UT/250ML-% IV SOLN
1500.0000 [IU]/h | INTRAVENOUS | Status: DC
  Administered 2019-11-13: 1500 [IU]/h via INTRAVENOUS
  Filled 2019-11-13: qty 250

## 2019-11-13 MED ORDER — ONDANSETRON HCL 4 MG/2ML IJ SOLN: 4.0000 mg | Freq: Four times a day (QID) | INTRAMUSCULAR | Status: DC | PRN

## 2019-11-13 MED ORDER — FUROSEMIDE 10 MG/ML IJ SOLN
80.0000 mg | Freq: Two times a day (BID) | INTRAMUSCULAR | Status: DC
  Administered 2019-11-13 – 2019-11-19 (×11): 80 mg via INTRAVENOUS
  Filled 2019-11-13 (×11): qty 8

## 2019-11-13 MED ORDER — SODIUM CHLORIDE 0.9% FLUSH
3.0000 mL | INTRAVENOUS | Status: DC | PRN
  Administered 2019-11-15: 3 mL via INTRAVENOUS

## 2019-11-13 MED ORDER — SODIUM CHLORIDE 0.9% FLUSH
3.0000 mL | Freq: Two times a day (BID) | INTRAVENOUS | Status: DC
  Administered 2019-11-13 – 2019-11-19 (×5): 3 mL via INTRAVENOUS

## 2019-11-13 MED ORDER — ASPIRIN 81 MG PO CHEW: 81.0000 mg | CHEWABLE_TABLET | ORAL | Status: AC

## 2019-11-13 MED ORDER — PANTOPRAZOLE SODIUM 40 MG PO TBEC: 40.0000 mg | DELAYED_RELEASE_TABLET | Freq: Every day | ORAL | Status: DC | PRN

## 2019-11-13 MED ORDER — METOPROLOL TARTRATE 5 MG/5ML IV SOLN
2.5000 mg | INTRAVENOUS | Status: DC | PRN
  Administered 2019-11-14: 2.5 mg via INTRAVENOUS
  Filled 2019-11-13: qty 5

## 2019-11-13 MED ORDER — CARVEDILOL 6.25 MG PO TABS
6.2500 mg | ORAL_TABLET | Freq: Two times a day (BID) | ORAL | Status: DC
Start: 2019-11-13 — End: 2019-11-20
  Administered 2019-11-13 – 2019-11-20 (×14): 6.25 mg via ORAL
  Filled 2019-11-13 (×14): qty 1

## 2019-11-13 MED ORDER — ACETAMINOPHEN 325 MG PO TABS
650.0000 mg | ORAL_TABLET | ORAL | Status: DC | PRN
  Administered 2019-11-14: 650 mg via ORAL
  Filled 2019-11-13: qty 2

## 2019-11-13 MED ORDER — SODIUM CHLORIDE 0.9 % WEIGHT BASED INFUSION: 3.0000 mL/kg/h | INTRAVENOUS | Status: DC

## 2019-11-13 MED ORDER — SODIUM CHLORIDE 0.9% FLUSH
3.0000 mL | Freq: Two times a day (BID) | INTRAVENOUS | Status: DC
  Administered 2019-11-13 – 2019-11-19 (×8): 3 mL via INTRAVENOUS

## 2019-11-13 MED ORDER — ATORVASTATIN CALCIUM 40 MG PO TABS
40.0000 mg | ORAL_TABLET | Freq: Every day | ORAL | Status: DC
  Administered 2019-11-14 – 2019-11-19 (×6): 40 mg via ORAL
  Filled 2019-11-13 (×6): qty 1

## 2019-11-13 MED ORDER — FLUTICASONE PROPIONATE 50 MCG/ACT NA SUSP
2.0000 | Freq: Every day | NASAL | Status: DC | PRN
  Filled 2019-11-13: qty 16

## 2019-11-13 NOTE — H&P (Signed)
History and Physical   [name]:[id number] DOB: 1936-01-21 DOA: 11/13/2019  Referring MD/NP/PA: Dr. Hayes Ludwig PCP: [name], MD  Patient coming from: [facility]  Chief Complaint: NSTEMI, new onset heart failure  HPI: [name] is an 84 y.o. male with a history of chronic HFpEF, 4L O2-dependent pulmonary fibrosis, and HTN who presented initially to [facility] ED mainly for a fast heart rate. He saw HR into 150's on pulse oximeter at home that wouldn't decrease even with rest. He had no chest pain or shortness of breath at that time, but has had increase in leg swelling for the previous week. In the ED, ECG confirmed ventricular rate in 160s. Initially thought to be in SVT, though not improved with adenosine x2. Diltiazem did slow rate somewhat with underlying rhythm appearing to be atrial fibrillation. Diltiazem infusion and anticoagulation initiated, cardiology was consulted. Troponin noted to be mildly elevated at 0.8 felt to be due to demand ischemia, however it continued to trend upward despite improvement in rate control, and the patient reported worsening dyspnea despite significant diuresis (~3L/24hrs) and improvement in edema. Echocardiogram showed newly depressed LV systolic function (LVEF 68-12%, down from 60-65% June 2020). Transfer to [facility] for LHC was recommended and the patient arrived 2/12.   He reports improvement in leg swelling though they are not at baseline. He feels short of breath mostly with any exertion but denies sensation of fast heart rate/palpitations. He has not had chest pain during any of this episode. Denies orthopnea. He has a chronic cough but has had no fevers or productive cough or urinary frequency or dysuria.   Review of Systems: Denies fever, chills, changes in vision or hearing, headache, sore throat, abdominal pain, nausea, vomiting, changes in bowel habits, blood in stool, change in bladder habits, myalgias, arthralgias, rash and per HPI. All  others reviewed and are negative.   Past Medical History:  Diagnosis Date  . AAA (abdominal aortic aneurysm) ([place])   . Allergic rhinitis   . BPH (benign prostatic hypertrophy)   . Colon polyp   . Hypertension   . Osteoarthritis    Past Surgical History:  Procedure Laterality Date  . CATARACT EXTRACTION, BILATERAL    . COLONOSCOPY    . HERNIA REPAIR     as child  . REPLACEMENT TOTAL KNEE Left   . TONSILLECTOMY     - Remote smoking history, lives with wife. No EtOH. [place] [employer] [place] [employer] in the [phone] where he was exposed to asbestos.  No Known Allergies Family History  Problem Relation Age of Onset  . Heart disease Mother   . Leukemia Father   . Lung disease Neg Hx   . Rheumatologic disease Neg Hx    - Family history otherwise reviewed and not pertinent.  Prior to Admission medications   Medication Sig Start Date End Date Taking? Authorizing Provider  amLODipine (NORVASC) 10 MG tablet Take 10 mg by mouth daily.    [provider]  fluticasone Asencion Islam) 50 MCG/ACT nasal spray 2 sprays each nostril daily as needed 02/13/16   [provider]  furosemide (LASIX) 80 MG tablet Take 1 tablet by mouth 2 (two) times a day. 03/27/19   [provider]  KRILL OIL PO Take by mouth daily.    [provider]  losartan (COZAAR) 50 MG tablet 1 tab daily 04/01/16   [provider]  Multiple Vitamin (MULTIVITAMIN) tablet Take 1 tablet by mouth  daily.    [provider]  omeprazole (PRILOSEC) 20 MG capsule Take 1 capsule (20 mg total) by mouth daily. Patient taking differently: Take 20 mg by mouth daily as needed.  01/28/19   Fenton Foy, NP  ranitidine (ZANTAC) 150 MG tablet Take 1 tablet by mouth daily. 11/03/18   [provider]    Physical Exam: Vitals:   11/13/19 1619  BP: (!) 155/82  Pulse: (!) 102  Resp: 18  Temp: 97.9 F (36.6 C)  TempSrc: Oral   Constitutional: Pleasant elderly  male in no distress, calm demeanor Eyes: Lids and conjunctivae normal, PERRL ENMT: Mucous membranes are moist. Posterior pharynx clear of any exudate or lesions. Fair dentition.  Neck: normal, supple, no masses, no thyromegaly Respiratory: Non-labored breathing 5LPM without accessory muscle use. Crackles in bilateral mid and lower lung zones. No wheezes. Cardiovascular: Regular rate and rhythm, no murmurs, rubs, or gallops. No carotid bruits. + JVD. 3+ pitting LE edema. Palpable pedal pulses. Abdomen: Normoactive bowel sounds. No tenderness, non-distended, and no masses palpated. No hepatosplenomegaly. GU: + indwelling catheter Musculoskeletal: Some clubbing. No joint deformity upper and lower extremities. Good ROM, no contractures. Normal muscle tone.  Skin: Warm, dry. No rashes, wounds, without ulcers on visualized skin.  Neurologic: CN II-XII grossly intact. Speech normal. No focal deficits in motor strength or sensation in all extremities.  Psychiatric: Alert and oriented x3. Normal judgment and insight. Mood euthymic with congruent affect.   Labs on Admission from [facility]:  11/13/19 03:01: Troponin I 1.80 H* 11/13/19 03:01: WBC 11.3 H, RBC 4.16 L, Hgb 12.5 L, Hct 39.8 L, MCV 96 H, MCH 30.1, MCHC 31.4 L, RDW 16.9 H, Plt Count 196, MPV 10.3, Neut % (Auto) 76.5 H, Lymph % (Auto) 10.9 L, Mono % (Auto) 8.2, Eos % (Auto) 3.6, Baso % (Auto) 0.8, Absolute Neuts (auto) 8.59 H, Absolute Lymphs (auto) 1.13 11/13/19 03:01: Hemoglobin A1c 5.4 11/13/19 03:01: Sodium 142, Potassium 3.9, Chloride 98, Carbon Dioxide 38 H, Anion Gap 10, BUN 25 H, Creatinine 0.90, Estimated GFR (MDRD) > 60, Glucose 88, Calculated Osmolality 276, Calcium 8.5, Magnesium 2.10, TSH 1.95 11/12/19 17:18: Troponin I 1.56 H* D 11/12/19 14:54: Urine Color YELLOW, Urine Clarity CLEAR, Urine pH 5.0, Ur Specific Gravity 1.010, Urine Protein 2+ H, Urine Glucose (UA) NEG, Urine Ketones NEG, Urine Occult Blood NEG, Urine Nitrite NEG, Urine  Bilirubin NEG, Urine [name] 0.2, Ur Leukocyte Esterase NEG, Urine RBC 0-2, Urine WBC 0-2, Ur Epithelial Cells OCC, Urine Bacteria FEW, Hyaline Casts 5-10 H, Urine Mucus SM AMT 11/12/19 14:40: Troponin I 1.09 H* 11/12/19 11:58: Magnesium Cancelled 11/12/19 11:58: PT 14.0 H, INR 1.3 H, APTT 26.7 11/12/19 11:58: WBC 12.0 H, RBC 4.49 L, Hgb 13.7 L, Hct 43.0, MCV 96 H, MCH 30.4, MCHC 31.8 L, RDW 16.5 H, Plt Count 266, MPV 9.5, Neut % (Auto) 77.9 H, Lymph % (Auto) 11.5 L, Mono % (Auto) 7.4, Eos % (Auto) 2.1, Baso % (Auto) 1.1, Absolute Neuts (auto) 9.24 H, Absolute Lymphs (auto) 1.32 11/12/19 11:58: Sodium 141, Potassium 4.2, Chloride 97 L, Carbon Dioxide 35 H, Anion Gap 13, BUN 29 H, Creatinine 1.10, Estimated GFR (MDRD) > 60, Glucose 121 H, Calculated Osmolality 278, Calcium 9.1, Magnesium 2.10, Total Bilirubin 0.7, AST 32, [name] 23, Alkaline Phosphatase 72, Troponin I 0.80 H*, Pro-B-Natriuretic Pept 14700 H, Total Protein 8.4 H, Albumin 4.0  CXR report from [facility]: FINDINGS: Stable cardiomegaly. No pneumothorax or pleural effusion is noted. Stable interstitial densities are noted throughout both  lungs most consistent with chronic interstitial lung disease or scarring. No definite acute abnormality is noted. Bony thorax is unremarkable.  IMPRESSION: Stable interstitial densities are noted throughout both lungs most consistent with chronic interstitial lung disease or scarring.  Echocardiogram report from [facility]:  RVIDd: 3.6 cm LVOT Diam: 2.0 cm IVSd: 1.4 cm LVIDd: 5.0 cm LVPWd: 1.4 cm LVIDs: 3.9 cm EF(Teich): 43.01 % LA Diam: 4.2 cm Ao Diam: 3.3 cm LAESV MOD A4C: 86.3 ml LAESV MOD A2C: 122.7 ml LAESV Index (A-L): 48.69 ml/m ------------ DOPPLER MV E Vel: 1.25 m/s MV A Vel: 0.00 m/s MV PHT: 33.51 ms MVA By PHT: 6.57 cm LVOT Vmax: 0.97 m/s AV Vmax: 2.75 m/s AVA Vmax: 1.10 cm AVA (VTI): 1.06 cm TR Vmax: 3.03 m/s TR maxPG: 37 mmHg RVSP: 51.65  mmHg   FINDINGS ------- Procedure:2D images, m-mode, color and spectral Doppler were obtained and reviewed. ECG rhythm:Atrial fibrillation. Study quality:This was a technically adequate study. Left Ventricle:The left ventricular size is normal.   There is moderate concentric left ventricular hypertrophy.   Overall left ventricular systolic function is moderate-severely impaired with, an EF between 30 - 35 %.   No obvious regional wall motion abnormalities were noted.  Unable to determine diastolic function dueto rhtyhm. Right Ventricle:The right ventricle is moderately enlarged.   The right ventricular systolic function is moderately impaired. Left Atrium:Left atrium is moderately dilated by volume. Right Atrium:The right atrium is moderately enlarged. ASD/VSD:No evidence of interatrial communication by color flow doppler analysis. Aortic Valve:The aortic valve is trileaflet and appears structurally normal.   There is mild aortic regurgitation.  Mild to m  oderate aortic stenosis with peak/mean pressure gradient of {30 mmhg maxPG / {18 mmhg  meanPG, the aortic valve area by continuity equation is 1.1 cm square.  NDI = 0.34  ( low flow ) Mitral Valve:Normal appearing mitral valve.    Moderate mitral regurgitation is present. Tricuspid Valve:The tricuspid valve appears structurally normal.   Moderate tricuspid regurgitation present.   The right ventricular systolic pressure, as measured by Doppler, is 52 mmhg. Pulmonic Valve:The pulmonic valve is normal.   Trace/mild (physiologic)  pulmonic regurgitation. Aorta:The aortic root, ascending aorta and aortic arch appear normal. IVC:The inferior vena cava is dilated with no significant inspiratory collapse which is consistent estimated right atrial pressure of 15 mmHg. Pericardium:There is no pericardial effusion.  CONCLUSIONS ----------- 1. This was a technically adequate study. 2. The left ventricular size is normal. 3. There is moderate  concentric left ventricular hypertrophy. 4. Overall left ventricular systolic function is moderate-severely impaired with, an EF between 30 - 35 %. 5. The right ventricle is moderately enlarged. 6. The right ventricular systolic function is moderately impaired. 7. Left atrium is moderately dilated by volume. 8. The right atrium is moderately enlarged. 9. There is mild aortic regurgitation. 10. Mild to moderate aortic stenosis with peak/mean pressure gradient of 30 mmhg maxPG / {18 mmhg  meanPG, the aortic valve area by continuity equation is 1.1 cm square.  NDI = 0.34  ( low flow ) 11. Moderate mitral regurgitation is present. 12. Moderate tricuspid regurgitation present. 13. The right ventricular systolic pressure, as measured by Doppler, is 52 mmhg. 14. Trace/mild (physiologic)  pulmonic regurgitation. 15. The aortic root, ascending aorta and aortic arch appear normal. 16. There is no pericardial effusion.  EKG: Ordered on admission  Assessment/Plan Principal Problem:   NSTEMI (non-ST elevated myocardial infarction) ([place]) Active Problems:   ILD (interstitial lung disease) ([place])   Essential  hypertension   Aortic aneurysm without rupture (HCC)   Osteoarthritis   Benign prostatic hyperplasia   Acute on chronic combined systolic and diastolic CHF (congestive heart failure) (HCC)   Atrial fibrillation with RVR (HCC)   Chronic respiratory failure with hypoxia ([place])   New onset atrial flutter with RVR: Since this is largely asymptomatic, unclear its actual duration.  - Rate control: Was on diltiazem gtt, transitioning to beta blocker per DC summary presumably due to LV systolic dysfunction. Give coreg scheduled with IV metoprolol prn. - Cardiology consulted, Dr. Gardiner Rhyme aware. - Anticoagulation: IV heparin due to need for procedure and due to CHA2DS2-VASc score of at least 4.  - Maintain K > 4 and Mg > 2 - TSH 1.95 at OSH  Acute combined systolic and diastolic CHF: Newly depressed LV  systolic function (LVEF 54%). ?Rate-mediated. Note no DVT on LE venous U/S. Weight earlier today was 245lbs, has reported weight down to 230lbs months ago at which time lasix was decreased as outpatient due to dehydration. - Inpatient dosing has been lasix 20m IV BID which we will continue pending cardiology recommendations.  - Continue strict I/O, daily weights, BMP.  - Beta blocker as below.  NSTEMI: Troponin trended up last night, peaking at 1.8. No regional wall motion abnormalities were noted on echocardiogram.  - Anticoagulated with lovenox - [facility] THarriet Massondiscussed with Dr. SGardiner Rhyme Possibly will get catheterization during this hospitalization.  - Started ASA, statin, beta blocker.  - HbA1c 5.4% at OSH. Check lipid panel.  Leukocytosis: Suspected to be reactive in the absence of CXR infiltrates, pyuria or urinary symptoms, and fever.  - Monitor, low threshold for blood cultures if develops fever.   Chronic hypoxemic respiratory failure due to ILD/asbestosis: Baseline is 4LPM, states he routinely drops into 70%'s with exertion.  - Continue supplemental oxygen - Continue bronchodilators  HTN:  - Will initiate guideline-directed therapy as vital signs allow.  Osteoarthritis. - Prn medications available  Acute urinary retention, history of BPH:  - Continue foley catheter placed 2/11  DVT prophylaxis: Lovenox therapeutic dose  Code Status: Full code confirmed on admission. Code status was discussed and discussions should continue. Family Communication: None at bedside Disposition Plan: Pending clinical progress, will maintain in cardiac telemetry unit for now Consults called: Cardiology  Admission status: Inpatient. The appropriate admission status for this patient is INPATIENT. Inpatient status is judged to be reasonable and necessary in order to provide the required intensity of service to ensure the patient's safety. The patient's presenting  symptoms, physical exam findings, and initial radiographic and laboratory data in the context of their chronic comorbidities is felt to place them at high risk for further clinical deterioration. Furthermore, it is not anticipated that the patient will be medically stable for discharge from the hospital within 2 midnights of admission. The following factors support the admission status of inpatient.    The patient's presenting symptoms include dyspnea, palpitations, volume overload.  The worrisome physical exam findings include edema.  The initial radiographic and laboratory data are worrisome because of heart failure.  The chronic co-morbidities include chronic respiratory failure.  Patient requires inpatient status due to high intensity of service, high risk for further deterioration and high frequency of surveillance required.  I certify that at the point of admission it is my clinical judgment that the patient will require inpatient hospital care spanning beyond 2 midnights from the point of admission.     RPatrecia Pour MD [facility] [email or web address] 11/13/2019, 4:35 PM

## 2019-11-13 NOTE — Progress Notes (Signed)
CRITICAL VALUE ALERT  Critical Value: Troponin 684  Date & Time Notied:  11/13/19  Provider Notified: Kathyrn Drown NP  Orders Received/Actions taken: Message left on ammion

## 2019-11-13 NOTE — Progress Notes (Signed)
    Paged by RN with elevated troponin at 684.  Patient comfortable with no anginal symptoms.  Vital signs are stable.  Heparin drip per pharmacy already initiated.  Plan is for cardiac catheterization on Monday, 11/16/2019.  Cardiology will follow along with him over the weekend.   Kathyrn Drown NP-C [place] Pager: [phone]

## 2019-11-13 NOTE — Progress Notes (Signed)
ANTICOAGULATION CONSULT NOTE - Initial Consult  Pharmacy Consult for heparin Indication: atrial fibrillation and NSTEMI  No Known Allergies  Patient Measurements: Height: _0  (190.5 cm) Weight: 244 lb 14.9 oz (111.1 kg) IBW/kg (Calculated) : 84.5 Heparin Dosing Weight: 107 kg  Vital Signs: Temp: 97.9 F (36.6 C) (02/12 1619) Temp Source: Oral (02/12 1619) BP: 155/82 (02/12 1619) Pulse Rate: 102 (02/12 1619)  Labs: No results for input(s): HGB, HCT, PLT, APTT, LABPROT, INR, HEPARINUNFRC, HEPRLOWMOCWT, CREATININE, CKTOTAL, [name], TROPONINIHS in the last 72 hours.  CrCl cannot be calculated (No successful lab value found.).   Medical History: Past Medical History:  Diagnosis Date  . AAA (abdominal aortic aneurysm) ([place])   . Allergic rhinitis   . BPH (benign prostatic hypertrophy)   . Colon polyp   . Hypertension   . Osteoarthritis     Medications:  Medications Prior to Admission  Medication Sig Dispense Refill Last Dose  . amLODipine (NORVASC) 10 MG tablet Take 10 mg by mouth daily.     . fluticasone (FLONASE) 50 MCG/ACT nasal spray 2 sprays each nostril daily as needed     . furosemide (LASIX) 80 MG tablet Take 1 tablet by mouth 2 (two) times a day.     Marland Kitchen KRILL OIL PO Take by mouth daily.     Marland Kitchen losartan (COZAAR) 50 MG tablet 1 tab daily     . Multiple Vitamin (MULTIVITAMIN) tablet Take 1 tablet by mouth daily.     Marland Kitchen omeprazole (PRILOSEC) 20 MG capsule Take 1 capsule (20 mg total) by mouth daily. (Patient taking differently: Take 20 mg by mouth daily as needed. ) 90 capsule 2   . ranitidine (ZANTAC) 150 MG tablet Take 1 tablet by mouth daily.       Assessment: 57 yom who transferred from [facility] for NSTEMI and Afib. He was transferred to [facility] for [place]. He was started on Lovenox 110 mg SQ q12h - last dose was at 02:46. No AC PTA noted.  [facility] labs: WBC 11.3 Hgb 12.5, Hct 39.8 Platelets 196 SCr 0.9 Troponin 0.8 > 1.09 > 1.56 > 1.8  No bleeding  noted.  Goal of Therapy:  Heparin level 0.3-0.7 units/ml Monitor platelets by anticoagulation protocol: Yes   Plan:  Begin heparin drip at 1500 units/hr with no bolus 8h HL Daily HL, CBC Monitor for s/sx of bleeding   Thank you for involving pharmacy in this patient's care.  Renold Genta, PharmD, BCPS Clinical Pharmacist Clinical phone for 11/13/2019 until 10p is [phone] 11/13/2019 5:08 PM  **Pharmacist phone directory can be found on [email or web address] listed under [place]**

## 2019-11-13 NOTE — Progress Notes (Signed)
    Please see paper chart from  [facility] for cardiology consultation note 11/12/19 per Dr. Harriet Masson and progress note 11/13/19.   Plan of care as outlined by Dr. Harriet Masson:  -Atrial flutter-diltiazem infusion stopped in the setting of LV dysfunction. Patient started on low-dose beta-blocker. On therapeutic Lovenox  -Elevated troponin-0.8> 1.09> 1.56> 1.80.  Given worsening LV function along with shortness of breath and elevated troponin plan was to transfer from [facility] to [facility] for LHC.  We will start ASA 81 and atorvastatin 40 mg p.o. daily.  Will obtain lipid profile as well as hemoglobin A1c. -Echocardiogram found to have a depressed LVEF at 30 to 35% when compared to echocardiogram from 03/2019 with an LVEF of 60 to 65%.  Also noted to have new mild aortic stenosis, moderate mitral regurgitation and moderate tricuspid regurgitation.  -Given acute exacerbation of heart failure and bilateral lower extremity edema we will continue patient on IV Lasix 80 mg twice daily with strict I&O and daily weights.   Personally would consider transitioning from therapeutic Lovenox to IV heparin infusion.    Kathyrn Drown NP-C [place] Pager: [phone]

## 2019-11-14 DIAGNOSIS — I214 Non-ST elevation (NSTEMI) myocardial infarction: Principal | ICD-10-CM

## 2019-11-14 DIAGNOSIS — I5043 Acute on chronic combined systolic (congestive) and diastolic (congestive) heart failure: Secondary | ICD-10-CM

## 2019-11-14 DIAGNOSIS — I4891 Unspecified atrial fibrillation: Secondary | ICD-10-CM

## 2019-11-14 LAB — BASIC METABOLIC PANEL
Anion gap: 11 (ref 5–15)
BUN: 22 mg/dL (ref 8–23)
CO2: 33 mmol/L — ABNORMAL HIGH (ref 22–32)
Calcium: 8.7 mg/dL — ABNORMAL LOW (ref 8.9–10.3)
Chloride: 97 mmol/L — ABNORMAL LOW (ref 98–111)
Creatinine, Ser: 0.95 mg/dL (ref 0.61–1.24)
GFR calc Af Amer: >60 mL/min (ref >60)
GFR calc non Af Amer: >60 mL/min (ref >60)
Glucose, Bld: 92 mg/dL (ref 70–99)
Potassium: 3.8 mmol/L (ref 3.5–5.1)
Sodium: 141 mmol/L (ref 135–145)

## 2019-11-14 LAB — CBC
HCT: 41.1 % (ref 39.0–52.0)
Hemoglobin: 12.6 g/dL — ABNORMAL LOW (ref 13.0–17.0)
MCH: 30.2 pg (ref 26.0–34.0)
MCHC: 30.7 g/dL (ref 30.0–36.0)
MCV: 98.6 fL (ref 80.0–100.0)
Platelets: 235 10*3/uL (ref 150–400)
RBC: 4.17 MIL/uL — ABNORMAL LOW (ref 4.22–5.81)
RDW: 15.7 % — ABNORMAL HIGH (ref 11.5–15.5)
WBC: 11.1 10*3/uL — ABNORMAL HIGH (ref 4.0–10.5)
nRBC: 0 % (ref 0.0–0.2)

## 2019-11-14 LAB — HEPARIN LEVEL (UNFRACTIONATED)
Heparin Unfractionated: 1.24 IU/mL — ABNORMAL HIGH (ref 0.30–0.70)
Heparin Unfractionated: 0.65 IU/mL (ref 0.30–0.70)

## 2019-11-14 MED ORDER — DIPHENHYDRAMINE HCL 25 MG PO CAPS
25.0000 mg | ORAL_CAPSULE | Freq: Once | ORAL | Status: AC | PRN
  Administered 2019-11-14: 25 mg via ORAL
  Filled 2019-11-14: qty 1

## 2019-11-14 MED ORDER — HEPARIN (PORCINE) 25000 UT/250ML-% IV SOLN
1350.0000 [IU]/h | INTRAVENOUS | Status: DC
  Administered 2019-11-14 – 2019-11-15 (×4): 1200 [IU]/h via INTRAVENOUS
  Filled 2019-11-14 (×2): qty 250

## 2019-11-14 NOTE — Progress Notes (Signed)
ANTICOAGULATION CONSULT NOTE   Pharmacy Consult for heparin Indication: atrial fibrillation and NSTEMI  No Known Allergies  Patient Measurements: Height: _0  (190.5 cm) Weight: 246 lb 4.1 oz (111.7 kg) IBW/kg (Calculated) : 84.5 Heparin Dosing Weight: 107 kg  Vital Signs: Temp: 98.4 F (36.9 C) (02/13 0747) Temp Source: Oral (02/13 0747) BP: 141/91 (02/13 1605) Pulse Rate: 101 (02/13 0747)  Labs: Recent Labs    11/13/19 1726 11/13/19 1849 11/14/19 0410 11/14/19 1637  HGB  --   --  12.6*  --   HCT  --   --  41.1  --   PLT  --   --  235  --   HEPARINUNFRC  --   --  1.24* 0.65  CREATININE  --   --  0.95  --   TROPONINIHS 684* 571*  --   --     Estimated Creatinine Clearance: 82.3 mL/min (by C-G formula based on SCr of 0.95 mg/dL).  Assessment: 84 y.o. male with Afib and CHF on IV heparin. Heparin level therapeutic at 0.65.   Goal of Therapy:  Heparin level 0.3-0.7 units/ml Monitor platelets by anticoagulation protocol: Yes   Plan:  -Continue heparin 1200 units/h -Recheck heparin level with morning labs   [name], PharmD, BCPS Clinical Pharmacist [phone] Please check AMION for all [place] numbers 11/14/2019

## 2019-11-14 NOTE — Progress Notes (Signed)
PROGRESS NOTE    [name]  QPR:[id number] DOB: [birth year] DOA: 11/13/2019 PCP: [name], MD  Outpatient Specialists:    Brief Narrative: As per H&P done by Dr. Vance Gather "[name] is an 84 y.o. male with a history of chronic HFpEF, 4L O2-dependent pulmonary fibrosis, and HTN who presented initially to [facility] ED mainly for a fast heart rate. He saw HR into 150's on pulse oximeter at home that wouldn't decrease even with rest. He had no chest pain or shortness of breath at that time, but has had increase in leg swelling for the previous week. In the ED, ECG confirmed ventricular rate in 160s. Initially thought to be in SVT, though not improved with adenosine x2. Diltiazem did slow rate somewhat with underlying rhythm appearing to be atrial fibrillation. Diltiazem infusion and anticoagulation initiated, cardiology was consulted. Troponin noted to be mildly elevated at 0.8 felt to be due to demand ischemia, however it continued to trend upward despite improvement in rate control, and the patient reported worsening dyspnea despite significant diuresis (~3L/24hrs) and improvement in edema. Echocardiogram showed newly depressed LV systolic function (LVEF 99-35%, down from 60-65% June 2020). Transfer to [facility] for LHC was recommended and the patient arrived 2/12.   He reports improvement in leg swelling though they are not at baseline. He feels short of breath mostly with any exertion but denies sensation of fast heart rate/palpitations. He has not had chest pain during any of this episode. Denies orthopnea. He has a chronic cough but has had no fevers or productive cough or urinary frequency or dysuria".   Assessment & Plan:   Principal Problem:   NSTEMI (non-ST elevated myocardial infarction) ([place]) Active Problems:   ILD (interstitial lung disease) ([place])   Essential hypertension   Aortic aneurysm without rupture (HCC)   Osteoarthritis   Benign prostatic hyperplasia   Acute on chronic  combined systolic and diastolic CHF (congestive heart failure) (HCC)   Atrial fibrillation with RVR (HCC)   Chronic respiratory failure with hypoxia ([place])  New onset atrial flutter with RVR:  -Heart rate is better controlled, but intermittently rapid.   -Heart rate of 150 bpm was documented earlier today.   -Continue rate control and anticoagulation.   -Cardiology is directing care.  S  Acute combined systolic and diastolic CHF:  Newly depressed LV systolic function (LVEF 70%). -Possibly tach induced. -Continue diuresis and rate control.  - Continue strict I/O, daily weights, BMP.  -Further management depend on hospital course.  NSTEMI:  Troponin trended up last night, peaking at 1.8. No regional wall motion abnormalities were noted on echocardiogram.  - Anticoagulated  -Continue other medications. -Transfer from [facility] for possible cardiac catheterization - HbA1c 5.4% at OSH. Check lipid panel.  Mild leukocytosis:  -Continue to monitor closely.   -No signs of acute infection.   -Continue to monitor closely    Chronic hypoxemic respiratory failure due to ILD/asbestosis: Baseline is 4LPM, states he routinely drops into 70%'s with exertion.  - Continue supplemental oxygen - Continue bronchodilators  HTN:  -Continue to optimize.  Osteoarthritis. - Optimize pain control.  Acute urinary retention, history of BPH:  - Continue foley catheter placed 2/11  DVT prophylaxis: On heparin drip Code Status: Full code Family Communication:  Disposition Plan: This will depend on hospital course   Consultants:   Cardiology   Procedures:   None  Antimicrobials:   None   Subjective: Heart rate is intermittently uncontrolled. Not sleeping well at night, with  intermittent coughing No chest pain  Objective: Vitals:   11/14/19 0513 11/14/19 0515 11/14/19 0540 11/14/19 0747  BP: (!) 140/110  (!) 148/85 (!) 130/94  Pulse:    (!) 101  Resp:    19  Temp:     98.4 F (36.9 C)  TempSrc:    Oral  SpO2: 91%  92% 97%  Weight:  111.7 kg    Height:        Intake/Output Summary (Last 24 hours) at 11/14/2019 1427 Last data filed at 11/14/2019 1407 Gross per 24 hour  Intake 166.05 ml  Output 900 ml  Net -733.95 ml   Filed Weights   11/13/19 1700 11/14/19 0515  Weight: 111.1 kg 111.7 kg    Examination:  General exam: Appears calm and comfortable  Respiratory system: Decreased air entry with mild crackles Cardiovascular system: S1 & S2, [name], soft and nontender. No organomegaly or masses felt. Normal bowel sounds heard. Central nervous system: Awake and alert.  Patient moves all extremities. Extremities: 2+ to 3 bilateral lower extremity edema  Data Reviewed: I have personally reviewed following labs and imaging studies  CBC: Recent Labs  Lab 11/14/19 0410  WBC 11.1*  HGB 12.6*  HCT 41.1  MCV 98.6  PLT 620   Basic Metabolic Panel: Recent Labs  Lab 11/14/19 0410  NA 141  K 3.8  CL 97*  CO2 33*  GLUCOSE 92  BUN 22  CREATININE 0.95  CALCIUM 8.7*   GFR: Estimated Creatinine Clearance: 82.3 mL/min (by C-G formula based on SCr of 0.95 mg/dL). Liver Function Tests: No results for input(s): AST, [name], [name], [name], [name], [name], [name], [name], [name], [name], TROPONINI in the last 168 hours. BNP (last 3 results) No results for input(s): PROBNP in the last 8760 hours. HbA1C: No results for input(s): HGBA1C in the last 72 hours. CBG: No results for input(s): GLUCAP in the last 168 hours. Lipid Profile: Recent Labs    11/13/19 1726  CHOL 124  HDL 37*  LDLCALC 76  TRIG 54  CHOLHDL 3.4    Thyroid Function Tests: No results for input(s): TSH, [name], [name], [name], [name], FOLATE, FERRITIN, [name], IRON, [name], [name], [name], [place], [name], [name], [name], [name], [name], [name], NITRITE, LEUKOCYTESUR Sepsis Labs: _0 (procalcitonin:4,lacticidven:4)  ) Recent Results (from the past 240 hour(s))  Respiratory Panel by RT PCR (Flu A&B, Covid) - Nasopharyngeal Swab     Status: None   Collection Time: 11/13/19  8:40 PM   Specimen: Nasopharyngeal Swab  Result Value Ref Range Status   SARS Coronavirus 2 by RT PCR NEGATIVE NEGATIVE Final    Comment: (NOTE) SARS-CoV-2 target nucleic acids are NOT DETECTED. The SARS-CoV-2 RNA is generally detectable in upper respiratoy specimens during the acute phase of infection. The lowest concentration of SARS-CoV-2 viral copies this assay can detect is 131 copies/mL. A negative result does not preclude SARS-Cov-2 infection and should not be used as the sole basis for treatment or other patient management decisions. A negative result may occur with  improper specimen collection/handling, submission of specimen other than nasopharyngeal swab,  presence of viral mutation(s) within the areas targeted by this assay, and inadequate number of viral copies (<131 copies/mL). A negative result must be combined with clinical observations, patient history, and epidemiological information. The expected result is Negative. Fact Sheet for Patients:  PinkCheek.be Fact Sheet for Healthcare Providers:  GravelBags.it This test is not yet ap proved or cleared by the [place] FDA and  has been authorized for detection and/or diagnosis of SARS-CoV-2 by FDA under an Emergency Use Authorization (EUA). This EUA will  remain  in effect (meaning this test can be used) for the duration of the COVID-19 declaration under Section 564(b)(1) of the Act, 21 U.S.C. section 360bbb-3(b)(1), unless the authorization is terminated or revoked sooner.    Influenza A by PCR NEGATIVE NEGATIVE Final   Influenza B by PCR NEGATIVE NEGATIVE Final    Comment: (NOTE) The Xpert Xpress SARS-CoV-2/FLU/RSV assay is intended as an aid in  the diagnosis of influenza from Nasopharyngeal swab specimens and  should not be used as a sole basis for treatment. Nasal washings and  aspirates are unacceptable for Xpert Xpress SARS-CoV-2/FLU/RSV  testing. Fact Sheet for Patients: PinkCheek.be Fact Sheet for Healthcare Providers: GravelBags.it This test is not yet approved or cleared by the [place] FDA and  has been authorized for detection and/or diagnosis of SARS-CoV-2 by  FDA under an Emergency Use Authorization (EUA). This EUA will remain  in effect (meaning this test can be used) for the duration of the  Covid-19 declaration under Section 564(b)(1) of the Act, 21  U.S.C. section 360bbb-3(b)(1), unless the authorization is  terminated or revoked. Performed at [facility], [place] [address], [place], [place] [address]          Radiology Studies: No results found.      Scheduled Meds: . aspirin  81 mg Oral Pre-Cath  . aspirin EC  81 mg Oral Daily  . atorvastatin  40 mg Oral q1800  . carvedilol  6.25 mg Oral BID WC  . furosemide  80 mg Intravenous BID  . sodium chloride flush  3 mL Intravenous Q12H  . sodium chloride flush  3 mL Intravenous Q12H   Continuous Infusions: . sodium chloride    . sodium chloride    . [START ON 11/16/2019] sodium chloride     Followed by  . [START ON 11/16/2019] sodium chloride    . heparin 1,200 Units/hr (11/14/19 0757)     LOS: 1 day     Time spent: 35 minutes    Dana Allan, MD  [facility] Pager #: [phone] 7PM-7AM contact night coverage as above

## 2019-11-14 NOTE — Progress Notes (Signed)
   Primary Cardiologist:  [place]   Subjective:  Chronic dyspnea from ILD no chest pain   Objective:  Vitals:   11/14/19 0513 11/14/19 0515 11/14/19 0540 11/14/19 0747  BP: (!) 140/110  (!) 148/85 (!) 130/94  Pulse:    (!) 101  Resp:    19  Temp:    98.4 F (36.9 C)  TempSrc:    Oral  SpO2: 91%  92% 97%  Weight:  111.7 kg    Height:        Intake/Output from previous day:  Intake/Output Summary (Last 24 hours) at 11/14/2019 1683 Last data filed at 11/14/2019 0600 Gross per 24 hour  Intake 166.05 ml  Output 800 ml  Net -633.95 ml    Physical Exam: Affect appropriate Chronically ill overweight white male  HEENT: normal Neck supple with no adenopathy JVP normal no bruits no thyromegaly Lungs ILD velcro like crackles diffuse  Heart:  S1/S2 no murmur, no rub, gallop or click PMI normal Abdomen: benighn, BS positve, no tenderness, no AAA no bruit.  No HSM or HJR Distal pulses intact with no bruits Plus 2 LE  edema Neuro non-focal Skin warm and dry No muscular weakness   Lab Results: Basic Metabolic Panel: Recent Labs    11/14/19 0410  NA 141  K 3.8  CL 97*  CO2 33*  GLUCOSE 92  BUN 22  CREATININE 0.95  CALCIUM 8.7*   CBC: Recent Labs    11/14/19 0410  WBC 11.1*  HGB 12.6*  HCT 41.1  MCV 98.6  PLT 235   Cardiac Enzymes: No results for input(s): CKTOTAL, [name], [name], TROPONINI in the last 72 hours. BNP: Invalid input(s): POCBNP D-Dimer: No results for input(s): DDIMER in the last 72 hours. Hemoglobin A1C: No results for input(s): HGBA1C in the last 72 hours. Fasting Lipid Panel: Recent Labs    11/13/19 1726  CHOL 124  HDL 37*  LDLCALC 76  TRIG 54  CHOLHDL 3.4    Imaging: No results found.  Cardiac Studies:  ECG:    Telemetry: atrial flutter rates 70-80 bpm 11/14/2019   Echo:   Medications:   . aspirin  81 mg Oral Pre-Cath  . aspirin EC  81 mg Oral Daily  . atorvastatin  40 mg Oral q1800  . carvedilol  6.25 mg Oral BID WC   . furosemide  80 mg Intravenous BID  . sodium chloride flush  3 mL Intravenous Q12H  . sodium chloride flush  3 mL Intravenous Q12H     . sodium chloride    . sodium chloride    . [START ON 11/16/2019] sodium chloride     Followed by  . [START ON 11/16/2019] sodium chloride    . heparin 1,200 Units/hr (11/14/19 0757)    Assessment/Plan:   1. Flutter: rate control on coreg.  On heparin will need DOAC post cath and ? TEE/DCC 2. CHF TTE EF 30-35% and mild AS Troponin peak 1.8 for right and left cath Monday will start low dose entresto  3. ILD:  Severe has followed with [facility] makes exam for CHF difficult Given age and ILD would not think he is a candidate for CABG if he ends up having severe CAD  Jenkins Rouge 11/14/2019, 8:06 AM

## 2019-11-14 NOTE — Progress Notes (Signed)
PT Cancellation Note  Patient Details Name: EWAN GRAU MRN: [id number] DOB: [birth year]   Cancelled Treatment:    Reason Eval/Treat Not Completed: Medical issues which prohibited therapy. Per discussion with nursing staff patient tachy into 150s at rest. PT will hold until pt's HR is more stable and pt is better able to participate in skilled PT evaluation.   Zenaida Niece 11/14/2019, 9:52 AM

## 2019-11-14 NOTE — Progress Notes (Signed)
[place] for heparin Indication: atrial fibrillation and NSTEMI  No Known Allergies  Patient Measurements: Height: _0  (190.5 cm) Weight: 246 lb 4.1 oz (111.7 kg) IBW/kg (Calculated) : 84.5 Heparin Dosing Weight: 107 kg  Vital Signs: Temp: 98.6 F (37 C) (02/13 0413) Temp Source: Oral (02/13 0413) BP: 148/85 (02/13 0540) Pulse Rate: 82 (02/13 0042)  Labs: Recent Labs    11/13/19 1726 11/13/19 1849 11/14/19 0410  HGB  --   --  12.6*  HCT  --   --  41.1  PLT  --   --  235  HEPARINUNFRC  --   --  1.24*  CREATININE  --   --  0.95  TROPONINIHS 684* 571*  --     Estimated Creatinine Clearance: 82.3 mL/min (by C-G formula based on SCr of 0.95 mg/dL).  Assessment: 84 y.o. male with chest Afib and CHF for heparin  Goal of Therapy:  Heparin level 0.3-0.7 units/ml Monitor platelets by anticoagulation protocol: Yes   Plan:  Hold heparin x 45 min, then decrease heparin 1200 units/hr  Check heparin level in 8 hours.  Phillis Knack, PharmD, BCPS

## 2019-11-15 LAB — BASIC METABOLIC PANEL
Anion gap: 8 (ref 5–15)
BUN: 22 mg/dL (ref 8–23)
CO2: 35 mmol/L — ABNORMAL HIGH (ref 22–32)
Calcium: 8.8 mg/dL — ABNORMAL LOW (ref 8.9–10.3)
Chloride: 98 mmol/L (ref 98–111)
Creatinine, Ser: 0.98 mg/dL (ref 0.61–1.24)
GFR calc Af Amer: >60 mL/min (ref >60)
GFR calc non Af Amer: >60 mL/min (ref >60)
Glucose, Bld: 97 mg/dL (ref 70–99)
Potassium: 3.6 mmol/L (ref 3.5–5.1)
Sodium: 141 mmol/L (ref 135–145)

## 2019-11-15 LAB — CBC
HCT: 40.1 % (ref 39.0–52.0)
Hemoglobin: 12.2 g/dL — ABNORMAL LOW (ref 13.0–17.0)
MCH: 30.6 pg (ref 26.0–34.0)
MCHC: 30.4 g/dL (ref 30.0–36.0)
MCV: 100.5 fL — ABNORMAL HIGH (ref 80.0–100.0)
Platelets: 223 10*3/uL (ref 150–400)
RBC: 3.99 MIL/uL — ABNORMAL LOW (ref 4.22–5.81)
RDW: 15.7 % — ABNORMAL HIGH (ref 11.5–15.5)
WBC: 8.8 10*3/uL (ref 4.0–10.5)
nRBC: 0 % (ref 0.0–0.2)

## 2019-11-15 LAB — HEPARIN LEVEL (UNFRACTIONATED): Heparin Unfractionated: 0.53 IU/mL (ref 0.30–0.70)

## 2019-11-15 MED ORDER — SACUBITRIL-VALSARTAN 24-26 MG PO TABS
1.0000 | ORAL_TABLET | Freq: Two times a day (BID) | ORAL | Status: DC
  Administered 2019-11-15 – 2019-11-20 (×10): 1 via ORAL
  Filled 2019-11-15 (×10): qty 1

## 2019-11-15 NOTE — Progress Notes (Signed)
Occupational Therapy Evaluation Patient Details Name: [name] MRN: [id number] DOB: [birth year] Today's Date: 11/15/2019    History of Present Illness Pt is a 84 y.o. M with significant PMH of chronic HFpEF, 4L O2 dependent pulmonary fibrosis, and HTN who was admitted with ventricular rate in 160s and new onset atrial flutter with RVR. ECG showed newly depressed LV systolic function LVEF 48-18%.    Clinical Impression   PTA, pt independent with mobility with occasional use of a cane. Wife assists with LB ADL as needed. Pt states it is normal for his O2 Sats to drop into the 70s with exertion, however, he is experiencing greater than normal shortness of breath with ADL activity. Prior to March, pt was participating in the pulmonary rehab program. Pt states that he is much weaker and has more difficulty with activity since he has been more sedentary since the pandemic. Note SpO2 into low 80s with minimal exertion. VC for pursed lip breathing. Pt demonstrated 1 LOB with mobility. Recommend use of RW. Will further assess safety with rollator during functional tasks. Pt states he can get his equipment through the [place]. Discussed re-enrolling in pulmonary rehab when able. At this time recommend follow up [place]. Will follow acutely.     Follow Up Recommendations  Home health OT;Supervision/Assistance - 24 hour(initially)    Equipment Recommendations  Other (comment)(rollator/RW; AE)    Recommendations for Other Services       Precautions / Restrictions Precautions Precautions: Fall;Other (comment) Precaution Comments: 4L O2 baseline      Mobility Bed Mobility Overal bed mobility: Modified Independent                Transfers Overall transfer level: Needs assistance   Transfers: Sit to/from Stand;Stand Pivot Transfers Sit to Stand: Min guard Stand pivot transfers: Min assist       General transfer comment: LOB noted    Balance Overall balance assessment: Needs assistance    Sitting balance-Leahy Scale: Good       Standing balance-Leahy Scale: Poor                             ADL either performed or assessed with clinical judgement   ADL Overall ADL's : Needs assistance/impaired     Grooming: Set up;Sitting   Upper Body Bathing: Set up;Sitting   Lower Body Bathing: Minimal assistance;Sit to/from stand   Upper Body Dressing : Set up;Sitting   Lower Body Dressing: Minimal assistance;Sit to/from stand   Toilet Transfer: Ambulation;Minimal assistance;RW   Toileting- Water quality scientist and Hygiene: Min guard;Sit to/from stand       Functional mobility during ADLs: Minimal assistance;Rolling walker;Cueing for safety(LOB posteriorly) General ADL Comments: May benefit from AE; interested in trying rollator; discussed difficulty with community ambulation; may need scooter      Vision         Perception     Praxis      Pertinent Vitals/Pain Pain Assessment: No/denies pain     Hand Dominance Right   Extremity/Trunk Assessment Upper Extremity Assessment Upper Extremity Assessment: Generalized weakness   Lower Extremity Assessment Lower Extremity Assessment: Defer to PT evaluation   Cervical / Trunk Assessment Cervical / Trunk Assessment: Normal   Communication Communication Communication: HOH   Cognition Arousal/Alertness: Awake/alert Behavior During Therapy: WFL for tasks assessed/performed Overall Cognitive Status: Within Functional Limits for tasks assessed  General Comments       Exercises Exercises: Other exercises Other Exercises Other Exercises: pursed lip breathing Other Exercises: incentive spirometer x 5 - ableto pull 1000 ml; VC for correct use   Shoulder Instructions      Home Living Family/patient expects to be discharged to:: Private residence Living Arrangements: Spouse/significant other Available Help at Discharge: Family;Available 24  hours/day Type of Home: House Home Access: Stairs to enter [employer] of Steps: 2   Home Layout: One level     Bathroom Shower/Tub: Occupational psychologist: Handicapped height Bathroom Accessibility: Yes How Accessible: Accessible via walker Home Equipment: Shower seat;Cane - single point          Prior Functioning/Environment Level of Independence: Independent with assistive device(s);Needs assistance    ADL's / Homemaking Assistance Needed: wife assists with socks/tying shoes            OT Problem List: Decreased strength;Decreased activity tolerance;Impaired balance (sitting and/or standing);Decreased knowledge of use of DME or AE;Decreased safety awareness;Cardiopulmonary status limiting activity      OT Treatment/Interventions: Self-care/ADL training;Therapeutic exercise;Energy conservation;DME and/or AE instruction;Therapeutic activities;Patient/family education;Balance training    OT Goals(Current goals can be found in the care plan section) Acute Rehab OT Goals Patient Stated Goal: to do as much as he can OT Goal Formulation: With patient Time For Goal Achievement: 11/29/19 Potential to Achieve Goals: Good  OT Frequency: Min 3X/week   Barriers to D/C:            Co-evaluation              AM-PAC OT "6 Clicks" Daily Activity     Outcome Measure Help from another person eating meals?: None Help from another person taking care of personal grooming?: A Little Help from another person toileting, which includes using toliet, bedpan, or urinal?: A Little Help from another person bathing (including washing, rinsing, drying)?: A Little Help from another person to put on and taking off regular upper body clothing?: A Little Help from another person to put on and taking off regular lower body clothing?: A Little 6 Click Score: 19   End of Session Equipment Utilized During Treatment: Gait belt;Rolling walker;Oxygen(4L) Nurse  Communication: Mobility status  Activity Tolerance: Patient tolerated treatment well Patient left: in bed;with call bell/phone within reach;with chair alarm set;with family/visitor present  OT Visit Diagnosis: Unsteadiness on feet (R26.81);Muscle weakness (generalized) (M62.81)                Time: 3838-1840 OT Time Calculation (min): 23 min Charges:  OT General Charges $OT Visit: 1 Visit OT Evaluation $OT Eval Moderate Complexity: 1 Mod OT Treatments $Self Care/Home Management : 8-22 mins  Maurie Boettcher, OT/L   Acute OT Clinical Specialist [place] Pager [phone] Office [phone]   [facility] 11/15/2019, 5:31 PM

## 2019-11-15 NOTE — Progress Notes (Signed)
Uneventful morning with patient. Will continue to monitor.

## 2019-11-15 NOTE — Progress Notes (Signed)
[place] for heparin Indication: atrial fibrillation and NSTEMI  Allergies  Allergen Reactions  . Lasix [Furosemide] Diarrhea and Other (See Comments)    Additionally, this made the patient become dehydrated (eventually)    Patient Measurements: Height: _0  (190.5 cm) Weight: 244 lb 12.8 oz (111 kg) IBW/kg (Calculated) : 84.5 Heparin Dosing Weight: 107 kg  Vital Signs: Temp: 97.8 F (36.6 C) (02/14 0417) Temp Source: Axillary (02/14 0417) BP: 145/98 (02/14 0417) Pulse Rate: 79 (02/14 0417)  Labs: Recent Labs    11/13/19 1726 11/13/19 1849 11/14/19 0410 11/14/19 1637 11/15/19 0443  HGB  --   --  12.6*  --  12.2*  HCT  --   --  41.1  --  40.1  PLT  --   --  235  --  223  HEPARINUNFRC  --   --  1.24* 0.65 0.53  CREATININE  --   --  0.95  --  0.98  TROPONINIHS 684* 571*  --   --   --     Estimated Creatinine Clearance: 79.5 mL/min (by C-G formula based on SCr of 0.98 mg/dL).  Assessment: 84 y.o. male with Afib and CHF on IV heparin.  Plans for cath 2/15 and add a DOAC after that.  -Heparin level therapeutic at 0.53.   Goal of Therapy:  Heparin level 0.3-0.7 units/ml Monitor platelets by anticoagulation protocol: Yes   Plan:  -Continue heparin 1200 units/h -Daily heparin level and CBC  Hildred Laser, PharmD Clinical Pharmacist **Pharmacist phone directory can now be found on [email or web address] ([email or web address]).  Listed under [place].

## 2019-11-15 NOTE — Progress Notes (Signed)
CSW acknowledges consult for SNF. The patient will require PT/OT evaluations. CSW will assist with disposition planning once the evaluations have been completed. PT eval scheduled for 11/14/19 was cancelled.   CSW will continue to follow.

## 2019-11-15 NOTE — Progress Notes (Signed)
   Primary Cardiologist:  [place]   Subjective:  Chronic dyspnea from ILD no chest pain   Objective:  Vitals:   11/14/19 0750 11/14/19 1605 11/14/19 2006 11/15/19 0417  BP: (!) 130/94 (!) 141/91  (!) 145/98  Pulse:   75 79  Resp:   18 19  Temp:   98.1 F (36.7 C) 97.8 F (36.6 C)  TempSrc:   Axillary Axillary  SpO2: 91%   97%  Weight:    111 kg  Height:        Intake/Output from previous day:  Intake/Output Summary (Last 24 hours) at 11/15/2019 0843 Last data filed at 11/15/2019 0418 Gross per 24 hour  Intake -  Output 1481 ml  Net -1481 ml    Physical Exam: Affect appropriate Chronically ill overweight white male  HEENT: normal Neck supple with no adenopathy JVP normal no bruits no thyromegaly Lungs ILD velcro like crackles diffuse  Heart:  S1/S2 no murmur, no rub, gallop or click PMI normal Abdomen: benighn, BS positve, no tenderness, no AAA no bruit.  No HSM or HJR Distal pulses intact with no bruits Plus 2 LE  edema Neuro non-focal Skin warm and dry No muscular weakness   Lab Results: Basic Metabolic Panel: Recent Labs    11/14/19 0410 11/15/19 0443  NA 141 141  K 3.8 3.6  CL 97* 98  CO2 33* 35*  GLUCOSE 92 97  BUN 22 22  CREATININE 0.95 0.98  CALCIUM 8.7* 8.8*   CBC: Recent Labs    11/14/19 0410 11/15/19 0443  WBC 11.1* 8.8  HGB 12.6* 12.2*  HCT 41.1 40.1  MCV 98.6 100.5*  PLT 235 223   Cardiac Enzymes: No results for input(s): CKTOTAL, [name], [name], TROPONINI in the last 72 hours. BNP: Invalid input(s): POCBNP D-Dimer: No results for input(s): DDIMER in the last 72 hours. Hemoglobin A1C: No results for input(s): HGBA1C in the last 72 hours. Fasting Lipid Panel: Recent Labs    11/13/19 1726  CHOL 124  HDL 37*  LDLCALC 76  TRIG 54  CHOLHDL 3.4    Imaging: No results found.  Cardiac Studies:  ECG:    Telemetry: atrial flutter rates 70-80 bpm 11/15/2019   Echo:   Medications:   . aspirin EC  81 mg Oral Daily  .  atorvastatin  40 mg Oral q1800  . carvedilol  6.25 mg Oral BID WC  . furosemide  80 mg Intravenous BID  . sodium chloride flush  3 mL Intravenous Q12H  . sodium chloride flush  3 mL Intravenous Q12H     . sodium chloride    . sodium chloride    . [START ON 11/16/2019] sodium chloride     Followed by  . [START ON 11/16/2019] sodium chloride    . heparin 1,200 Units/hr (11/14/19 1910)    Assessment/Plan:   1. Flutter: rate control on coreg.  On heparin will need DOAC post cath and ? TEE/DCC 2. CHF TTE EF 30-35% and mild AS Troponin peak 1.8 for right and left cath Monday Entresto started 11/14/19  3. ILD:  Severe has followed with [facility] makes exam for CHF difficult Given age and ILD would not think he is a candidate for CABG if he ends up having severe CAD  Jenkins Rouge 11/15/2019, 8:43 AM

## 2019-11-15 NOTE — Evaluation (Signed)
Physical Therapy Evaluation Patient Details Name: [name] MRN: [id number] DOB: [birth year] Today's Date: 11/15/2019   History of Present Illness  Pt is a 84 y.o. M with significant PMH of chronic HFpEF, 4L O2 dependent pulmonary fibrosis, and HTN who was admitted with ventricular rate in 160s and new onset atrial flutter with RVR. ECG showed newly depressed LV systolic function LVEF 03-50%.   Clinical Impression  Pt admitted with above. Pt presents with decreased cardiovascular endurance and balance impairments. Pt weaned from 6L to 5L O2 and maintained sats > 90% throughout session. Tolerated ambulating from bed to chair, HR 83-107 bpm. Will continue to progress mobility as tolerated. May benefit from use of Rollator.     Follow Up Recommendations Home health PT;Supervision for mobility/OOB (unsure if pt will be agreeable)    Equipment Recommendations  None recommended by PT    Recommendations for Other Services OT consult     Precautions / Restrictions Precautions Precautions: Fall;Other (comment) Precaution Comments: 4L O2 baseline Restrictions Weight Bearing Restrictions: No      Mobility  Bed Mobility Overal bed mobility: Modified Independent                Transfers Overall transfer level: Needs assistance Equipment used: None Transfers: Sit to/from Stand Sit to Stand: Min guard         General transfer comment: Increased time/effort to boost up. Able to achieve on 2nd attempt without physical assist  Ambulation/Gait Ambulation/Gait assistance: Min guard Gait Distance (Feet): 3 Feet Assistive device: None Gait Pattern/deviations: Step-through pattern;Decreased stride length Gait velocity: decreased   General Gait Details: Pivotal steps from bed to recliner, min guard for stability  Stairs            Wheelchair Mobility    Modified Rankin (Stroke Patients Only)       Balance Overall balance assessment: Mild deficits observed, not  formally tested                                           Pertinent Vitals/Pain Pain Assessment: No/denies pain    Home Living Family/patient expects to be discharged to:: Private residence Living Arrangements: Spouse/significant other Available Help at Discharge: Family;Available 24 hours/day Type of Home: House Home Access: Stairs to enter   [employer] of Steps: 2   Home Equipment: Shower seat;Cane - single point      Prior Function Level of Independence: Independent         Comments: Limited household ambulator, does not use AD     Hand Dominance        Extremity/Trunk Assessment   Upper Extremity Assessment Upper Extremity Assessment: Overall WFL for tasks assessed    Lower Extremity Assessment Lower Extremity Assessment: Overall WFL for tasks assessed       Communication   Communication: No difficulties  Cognition Arousal/Alertness: Awake/alert Behavior During Therapy: WFL for tasks assessed/performed Overall Cognitive Status: Within Functional Limits for tasks assessed                                        General Comments      Exercises     Assessment/Plan    PT Assessment Patient needs continued PT services  PT Problem List Decreased strength;Decreased activity tolerance;Decreased balance;Decreased mobility;Cardiopulmonary status limiting  activity       PT Treatment Interventions Stair training;DME instruction;Gait training;Functional mobility training;Therapeutic exercise;Therapeutic activities;Balance training;Patient/family education    PT Goals (Current goals can be found in the Care Plan section)  Acute Rehab PT Goals Patient Stated Goal: Promote endurance, increase walking distance PT Goal Formulation: With patient Time For Goal Achievement: 11/29/19 Potential to Achieve Goals: Good    Frequency Min 3X/week   Barriers to discharge        Co-evaluation                AM-PAC PT "6 Clicks" Mobility  Outcome Measure Help needed turning from your back to your side while in a flat bed without using bedrails?: None Help needed moving from lying on your back to sitting on the side of a flat bed without using bedrails?: None Help needed moving to and from a bed to a chair (including a wheelchair)?: A Little Help needed standing up from a chair using your arms (e.g., wheelchair or bedside chair)?: A Little Help needed to walk in hospital room?: A Little Help needed climbing 3-5 steps with a railing? : A Lot 6 Click Score: 19    End of Session Equipment Utilized During Treatment: Oxygen Activity Tolerance: Patient tolerated treatment well Patient left: in chair;with call bell/phone within reach;with family/visitor present Nurse Communication: Mobility status PT Visit Diagnosis: Unsteadiness on feet (R26.81);Difficulty in walking, not elsewhere classified (R26.2)    Time: 0258-5277 PT Time Calculation (min) (ACUTE ONLY): 15 min   Charges:   PT Evaluation $PT Eval Moderate Complexity: 1 Mod            Wyona Almas, PT, DPT Acute Rehabilitation Services Pager [phone] Office [phone]   Deno Etienne 11/15/2019, 11:53 AM

## 2019-11-15 NOTE — Progress Notes (Signed)
PROGRESS NOTE    [name]  FFM:[id number] DOB: 1936/08/16 DOA: 11/13/2019 PCP: [name], [name], [name], [name], [name], [name], [name], [name], daily weights, [name], history of BPH:  - Continue foley catheter placed 2/11  DVT prophylaxis: On heparin drip Code Status: Full code Family Communication:  Disposition Plan: This will depend on hospital course   Consultants:   Cardiology   Procedures:   None  Antimicrobials:   None   Subjective: Patient feels better today. Shortness of breath has improved. Leg edema is improving.  Objective: Vitals:   11/14/19 0750 11/14/19 1605 11/14/19 2006 11/15/19 0417  BP: (!) 130/94 (!) 141/91  (!) 145/98  Pulse:   75 79  Resp:   18 19  Temp:   98.1 F (36.7 C) 97.8 F (36.6 C)  TempSrc:   Axillary Axillary  SpO2: 91%   97%  Weight:    111 kg  Height:        Intake/Output Summary (Last 24 hours) at 11/15/2019 1009 Last data filed at 11/15/2019 0418 Gross per 24 hour  Intake --  Output 1231 ml  Net -1231 ml   Filed Weights   11/13/19 1700 11/14/19 0515 11/15/19 0417  Weight:  111.1 kg 111.7 kg 111 kg    Examination:  General exam: Appears calm and comfortable  Respiratory system: Decreased air entry William mild crackles Cardiovascular system: S1 & S2, [name], soft and nontender. No  organomegaly or masses felt. Normal bowel sounds heard. Central nervous system: Awake and alert.  Patient moves all extremities. Extremities: 2+ to 3 bilateral lower extremity edema  Data Reviewed: I have personally reviewed following labs and imaging studies  CBC: Recent Labs  Lab 11/14/19 0410 11/15/19 0443  WBC 11.1* 8.8  HGB 12.6* 12.2*  HCT 41.1 40.1  MCV 98.6 100.5*  PLT 235 462   Basic Metabolic Panel: Recent Labs  Lab 11/14/19 0410 11/15/19 0443  NA 141 141  K 3.8 3.6  CL 97* 98  CO2 33* 35*  GLUCOSE 92 97  BUN 22 22  CREATININE 0.95 0.98  CALCIUM 8.7* 8.8*   GFR: Estimated Creatinine Clearance: 79.5 mL/min (by C-G formula based on SCr of 0.98 mg/dL). Liver Function Tests: No results for input(s): AST, [name], [name], [name], [name], [name], [name], [name], [name], [name], TROPONINI in the last 168 hours. BNP (last 3 results) No results for input(s): PROBNP in the last 8760 hours. HbA1C: No results for input(s): HGBA1C in the last 72 hours. CBG: No results for input(s): GLUCAP in the last 168 hours. Lipid Profile: Recent Labs    11/13/19 1726  CHOL 124  HDL 37*  LDLCALC 76  TRIG 54  CHOLHDL 3.4   Thyroid Function Tests: No results for input(s): TSH, [name], [name], [name], [name], FOLATE, FERRITIN, [name], IRON, [name], [name], [name], [name], [name], [name], [name], [name], [name], [name], NITRITE, LEUKOCYTESUR Sepsis Labs: _0 (procalcitonin:4,[name], Covid) - Nasopharyngeal Swab     Status: None   Collection Time: 11/13/19  8:40 PM   Specimen: Nasopharyngeal Swab  Result Value Ref Range Status   SARS Coronavirus 2 by RT PCR NEGATIVE NEGATIVE Final    Comment: (NOTE) SARS-CoV-2 target nucleic acids are NOT DETECTED. The SARS-CoV-2 RNA is generally detectable in upper respiratoy specimens during the acute phase of infection. The lowest concentration of SARS-CoV-2 viral copies this assay can detect is 131 copies/mL. A negative result does not preclude SARS-Cov-2 infection and should not be used as the sole basis for treatment or other patient management decisions. A negative result may occur William  improper specimen collection/handling, submission of specimen other than nasopharyngeal swab, presence of viral mutation(s) within the areas targeted by this assay, and inadequate number of viral copies (<131 copies/mL). A negative result must be combined William clinical observations, patient history, and epidemiological information. The expected result is Negative. Fact Sheet for Patients:  PinkCheek.be Fact Sheet for Healthcare Providers:  GravelBags.it This test is not yet ap proved or cleared by the [place] FDA and  has been authorized for detection and/or diagnosis of SARS-CoV-2 by FDA under an Pollard Use Authorization (EUA). This EUA will remain  in effect (meaning this test can be used) for the duration  of the COVID-19 declaration under Section 564(b)(1) of the Act, 21 U.S.C. section 360bbb-3(b)(1), unless the authorization is terminated or revoked sooner.    Influenza A by PCR NEGATIVE NEGATIVE Final   Influenza B by PCR NEGATIVE NEGATIVE Final    Comment: (NOTE) The Xpert Xpress SARS-CoV-2/FLU/RSV assay is intended as an aid in  the diagnosis of influenza from Nasopharyngeal swab specimens and  should not be used as a sole  basis for treatment. Nasal washings and  aspirates are unacceptable for Xpert Xpress SARS-CoV-2/FLU/RSV  testing. Fact Sheet for Patients: PinkCheek.be Fact Sheet for Healthcare Providers: GravelBags.it This test is not yet approved or cleared by the [place] FDA and  has been authorized for detection and/or diagnosis of SARS-CoV-2 by  FDA under an Pollard Use Authorization (EUA). This EUA will remain  in effect (meaning this test can be used) for the duration of the  Covid-19 declaration under Section 564(b)(1) of the Act, 21  U.S.C. section 360bbb-3(b)(1), unless the authorization is  terminated or revoked. Performed at [facility], [place] [address], [place], [place] [address]          Radiology Studies: No results found.      Scheduled Meds: . aspirin EC  81 mg Oral Daily  . atorvastatin  40 mg Oral q1800  . carvedilol  6.25 mg Oral BID WC  . furosemide  80 mg Intravenous BID  . sacubitril-valsartan  1 tablet Oral BID  . sodium chloride flush  3 mL Intravenous Q12H  . sodium chloride flush  3 mL Intravenous Q12H   Continuous Infusions: . sodium chloride    . sodium chloride    . [START ON 11/16/2019] sodium chloride     Followed by  . [START ON 11/16/2019] sodium chloride    . heparin 1,200 Units/hr (11/14/19 1910)     LOS: 2 days     Time spent: 35 minutes    Dana Allan, William  [facility] Pager #: [phone] 7PM-7AM contact night coverage as above

## 2019-11-16 ENCOUNTER — Encounter (HOSPITAL_COMMUNITY)
Admission: AD | Disposition: A | Discharge status: Home Health | Payer: Self-pay | Source: Other Acute Inpatient Hospital | Attending: Internal Medicine

## 2019-11-16 ENCOUNTER — Other Ambulatory Visit: Payer: Self-pay

## 2019-11-16 DIAGNOSIS — I2510 Atherosclerotic heart disease of native coronary artery without angina pectoris: Secondary | ICD-10-CM

## 2019-11-16 DIAGNOSIS — I483 Typical atrial flutter: Secondary | ICD-10-CM

## 2019-11-16 DIAGNOSIS — I4892 Unspecified atrial flutter: Secondary | ICD-10-CM

## 2019-11-16 DIAGNOSIS — J849 Interstitial pulmonary disease, unspecified: Secondary | ICD-10-CM

## 2019-11-16 DIAGNOSIS — I255 Ischemic cardiomyopathy: Secondary | ICD-10-CM

## 2019-11-16 DIAGNOSIS — I5021 Acute systolic (congestive) heart failure: Secondary | ICD-10-CM

## 2019-11-16 DIAGNOSIS — I428 Other cardiomyopathies: Secondary | ICD-10-CM

## 2019-11-16 DIAGNOSIS — R778 Other specified abnormalities of plasma proteins: Secondary | ICD-10-CM

## 2019-11-16 HISTORY — PX: RIGHT/LEFT HEART CATH AND CORONARY ANGIOGRAPHY: CATH118266

## 2019-11-16 LAB — BASIC METABOLIC PANEL
Anion gap: 10 (ref 5–15)
BUN: 22 mg/dL (ref 8–23)
CO2: 40 mmol/L — ABNORMAL HIGH (ref 22–32)
Calcium: 8.5 mg/dL — ABNORMAL LOW (ref 8.9–10.3)
Chloride: 94 mmol/L — ABNORMAL LOW (ref 98–111)
Creatinine, Ser: 0.92 mg/dL (ref 0.61–1.24)
GFR calc Af Amer: >60 mL/min (ref >60)
GFR calc non Af Amer: >60 mL/min (ref >60)
Glucose, Bld: 105 mg/dL — ABNORMAL HIGH (ref 70–99)
Potassium: 3.6 mmol/L (ref 3.5–5.1)
Sodium: 144 mmol/L (ref 135–145)

## 2019-11-16 LAB — POCT I-STAT EG7
Acid-Base Excess: 14 mmol/L — ABNORMAL HIGH (ref 0.0–2.0)
Bicarbonate: 43.3 mmol/L — ABNORMAL HIGH (ref 20.0–28.0)
Calcium, Ion: 1.16 mmol/L (ref 1.15–1.40)
HCT: 40 % (ref 39.0–52.0)
Hemoglobin: 13.6 g/dL (ref 13.0–17.0)
O2 Saturation: 52 %
Potassium: 3.4 mmol/L — ABNORMAL LOW (ref 3.5–5.1)
Sodium: 143 mmol/L (ref 135–145)
TCO2: 46 mmol/L — ABNORMAL HIGH (ref 22–32)
pCO2, Ven: 76.9 mmHg (ref 44.0–60.0)
pH, Ven: 7.359 (ref 7.250–7.430)
pO2, Ven: 31 mmHg — CL (ref 32.0–45.0)

## 2019-11-16 LAB — POCT I-STAT 7, (LYTES, BLD GAS, ICA,H+H)
Acid-Base Excess: 12 mmol/L — ABNORMAL HIGH (ref 0.0–2.0)
Bicarbonate: 40.2 mmol/L — ABNORMAL HIGH (ref 20.0–28.0)
Calcium, Ion: 1.15 mmol/L (ref 1.15–1.40)
HCT: 41 % (ref 39.0–52.0)
Hemoglobin: 13.9 g/dL (ref 13.0–17.0)
O2 Saturation: 88 %
Potassium: 3.5 mmol/L (ref 3.5–5.1)
Sodium: 143 mmol/L (ref 135–145)
TCO2: 42 mmol/L — ABNORMAL HIGH (ref 22–32)
pCO2 arterial: 67.2 mmHg (ref 32.0–48.0)
pH, Arterial: 7.385 (ref 7.350–7.450)
pO2, Arterial: 59 mmHg — ABNORMAL LOW (ref 83.0–108.0)

## 2019-11-16 LAB — MAGNESIUM
Magnesium: 1.8 mg/dL (ref 1.7–2.4)
Magnesium: 1.8 mg/dL (ref 1.7–2.4)

## 2019-11-16 LAB — CBC
HCT: 40.8 % (ref 39.0–52.0)
Hemoglobin: 12.3 g/dL — ABNORMAL LOW (ref 13.0–17.0)
MCH: 30.6 pg (ref 26.0–34.0)
MCHC: 30.1 g/dL (ref 30.0–36.0)
MCV: 101.5 fL — ABNORMAL HIGH (ref 80.0–100.0)
Platelets: 235 10*3/uL (ref 150–400)
RBC: 4.02 MIL/uL — ABNORMAL LOW (ref 4.22–5.81)
RDW: 15.5 % (ref 11.5–15.5)
WBC: 10.3 10*3/uL (ref 4.0–10.5)
nRBC: 0 % (ref 0.0–0.2)

## 2019-11-16 LAB — POTASSIUM: Potassium: 3.4 mmol/L — ABNORMAL LOW (ref 3.5–5.1)

## 2019-11-16 LAB — HEPARIN LEVEL (UNFRACTIONATED): Heparin Unfractionated: 0.22 IU/mL — ABNORMAL LOW (ref 0.30–0.70)

## 2019-11-16 LAB — POCT ACTIVATED CLOTTING TIME: Activated Clotting Time: 158 seconds

## 2019-11-16 SURGERY — RIGHT/LEFT HEART CATH AND CORONARY ANGIOGRAPHY
Anesthesia: LOCAL

## 2019-11-16 MED ORDER — LIDOCAINE HCL (PF) 1 % IJ SOLN
INTRAMUSCULAR | Status: AC
  Filled 2019-11-16: qty 30

## 2019-11-16 MED ORDER — POTASSIUM CHLORIDE CRYS ER 20 MEQ PO TBCR
40.0000 meq | EXTENDED_RELEASE_TABLET | Freq: Once | ORAL | Status: AC
  Administered 2019-11-16: 40 meq via ORAL
  Filled 2019-11-16: qty 2

## 2019-11-16 MED ORDER — HEPARIN (PORCINE) IN NACL 1000-0.9 UT/500ML-% IV SOLN
INTRAVENOUS | Status: AC
  Filled 2019-11-16: qty 1000

## 2019-11-16 MED ORDER — APIXABAN 5 MG PO TABS
5.0000 mg | ORAL_TABLET | Freq: Two times a day (BID) | ORAL | Status: DC
  Administered 2019-11-16 – 2019-11-20 (×8): 5 mg via ORAL
  Filled 2019-11-16 (×8): qty 1

## 2019-11-16 MED ORDER — SODIUM CHLORIDE 0.9% FLUSH: 3.0000 mL | INTRAVENOUS | Status: DC | PRN

## 2019-11-16 MED ORDER — VERAPAMIL HCL 2.5 MG/ML IV SOLN
INTRAVENOUS | Status: DC | PRN
  Administered 2019-11-16: 14:00:00 10 mL via INTRA_ARTERIAL

## 2019-11-16 MED ORDER — HEPARIN (PORCINE) IN NACL 1000-0.9 UT/500ML-% IV SOLN
INTRAVENOUS | Status: DC | PRN
  Administered 2019-11-16 (×2): 500 mL

## 2019-11-16 MED ORDER — VERAPAMIL HCL 2.5 MG/ML IV SOLN
INTRAVENOUS | Status: AC
  Filled 2019-11-16: qty 2

## 2019-11-16 MED ORDER — IOHEXOL 350 MG/ML SOLN
INTRAVENOUS | Status: DC | PRN
  Administered 2019-11-16: 15:00:00 80 mL via INTRA_ARTERIAL

## 2019-11-16 MED ORDER — SODIUM CHLORIDE 0.9 % IV SOLN: INTRAVENOUS | Status: DC

## 2019-11-16 MED ORDER — HEPARIN SODIUM (PORCINE) 1000 UNIT/ML IJ SOLN
INTRAMUSCULAR | Status: AC
  Filled 2019-11-16: qty 1

## 2019-11-16 MED ORDER — LIDOCAINE HCL (PF) 1 % IJ SOLN
INTRAMUSCULAR | Status: DC | PRN
  Administered 2019-11-16: 2 mL via INTRADERMAL
  Administered 2019-11-16: 15 mL via INTRADERMAL
  Administered 2019-11-16: 2 mL via INTRADERMAL

## 2019-11-16 MED ORDER — SODIUM CHLORIDE 0.9 % IV SOLN: 250.0000 mL | INTRAVENOUS | Status: DC | PRN

## 2019-11-16 MED ORDER — HEPARIN SODIUM (PORCINE) 1000 UNIT/ML IJ SOLN
INTRAMUSCULAR | Status: DC | PRN
  Administered 2019-11-16: 4000 [IU] via INTRAVENOUS

## 2019-11-16 MED ORDER — HEPARIN (PORCINE) 25000 UT/250ML-% IV SOLN
1350.0000 [IU]/h | INTRAVENOUS | Status: DC
  Filled 2019-11-16: qty 250

## 2019-11-16 MED ORDER — SODIUM CHLORIDE 0.9% FLUSH
3.0000 mL | Freq: Two times a day (BID) | INTRAVENOUS | Status: DC
  Administered 2019-11-18 – 2019-11-19 (×3): 3 mL via INTRAVENOUS

## 2019-11-16 MED ORDER — POTASSIUM CHLORIDE CRYS ER 20 MEQ PO TBCR
40.0000 meq | EXTENDED_RELEASE_TABLET | ORAL | Status: AC
  Administered 2019-11-16 – 2019-11-17 (×2): 40 meq via ORAL
  Filled 2019-11-16 (×2): qty 2

## 2019-11-16 MED ORDER — MAGNESIUM SULFATE 2 GM/50ML IV SOLN
2.0000 g | Freq: Once | INTRAVENOUS | Status: AC
  Administered 2019-11-16: 2 g via INTRAVENOUS
  Filled 2019-11-16: qty 50

## 2019-11-16 SURGICAL SUPPLY — 23 items
CATH 5FR JL3.5 JR4 ANG PIG MP (CATHETERS) ×1 IMPLANT
CATH BALLN WEDGE 5F 110CM (CATHETERS) ×1 IMPLANT
CATH INFINITI 5F FL5 125 (CATHETERS) ×1 IMPLANT
CATH INFINITI 5F JL4 125CM (CATHETERS) ×1 IMPLANT
CATH LAUNCHER 5F RADR (CATHETERS) IMPLANT
CATHETER LAUNCHER 5F RADR (CATHETERS) ×2
DEVICE RAD COMP TR BAND LRG (VASCULAR PRODUCTS) ×1 IMPLANT
GLIDESHEATH SLEND SS 6F .021 (SHEATH) ×1 IMPLANT
GUIDEWIRE .025 260CM (WIRE) ×1 IMPLANT
GUIDEWIRE INQWIRE 1.5J.035X260 (WIRE) IMPLANT
INQWIRE 1.5J .035X260CM (WIRE) ×2
KIT HEART LEFT (KITS) ×2 IMPLANT
PACK CARDIAC CATHETERIZATION (CUSTOM PROCEDURE TRAY) ×2 IMPLANT
PINNACLE LONG 5F 25CM (SHEATH) ×2
SHEATH GLIDE SLENDER 4/5FR (SHEATH) ×1 IMPLANT
SHEATH INTROD PINNACLE 5F 25CM (SHEATH) IMPLANT
SHEATH PINNACLE 5F 10CM (SHEATH) ×1 IMPLANT
SHEATH PROBE COVER 6X72 (BAG) ×1 IMPLANT
TRANSDUCER W/STOPCOCK (MISCELLANEOUS) ×2 IMPLANT
TUBING CIL FLEX 10 FLL-RA (TUBING) ×2 IMPLANT
WIRE EMERALD 3MM-J .025X260CM (WIRE) ×1 IMPLANT
WIRE EMERALD 3MM-J .035X150CM (WIRE) ×1 IMPLANT
WIRE HI TORQ VERSACORE-J 145CM (WIRE) ×1 IMPLANT

## 2019-11-16 NOTE — Progress Notes (Signed)
Pharmacy: Dofetilide (Tikosyn) - Initial Consult Assessment and Electrolyte Replacement  Pharmacy consulted to assist in monitoring and replacing electrolytes in this 84 y.o. male admitted on 11/13/2019 undergoing dofetilide initiation.   Labs:    Component Value Date/Time   K 3.4 (L) 11/16/2019 2044   MG 1.8 11/16/2019 2044     Plan: Potassium: K < 3.5:  Defer Tikosyn initiation until next day and give KCl 40 mEq q4hr x2 then BMET with AM labs.   Magnesium: Mg 1.8-2: Give Mg 2 gm IV x1 to prevent Mg from dropping below 1.8 - do not need to recheck Mg. Appropriate to initiate Tikosyn   Thank you for allowing pharmacy to participate in this patient's care   Alanda Slim, PharmD, [facility] Clinical Pharmacist Please see AMION for all Pharmacists' Contact Phone Numbers 11/16/2019, 9:57 PM

## 2019-11-16 NOTE — Progress Notes (Signed)
PROGRESS NOTE    [name]  HCW:[id number] DOB: 1936-05-18 DOA: 11/13/2019 PCP: [name], MD  Brief Narrative:  Patient is an 84 year old Caucasian male, obese, with past medical history significant for chronic HFpEF, 4L O2-dependentpulmonary fibrosis and hypertension.  Patient presented initially to [facility] emergency room with 1 week history of worsening lower extremity edema, and atrial fibrillation with rapid ventricular response.  Heart rate control has improved with Cardizem.   Patient is also on heparin drip.  Troponin is elevated.  Echocardiogram showed newly depressed LV systolic function (LVEF 17-61%, down from 60-65% June 2020).  Patient was transferred from [facility] to [facility] for possible left heart catheterization.  The plan is to proceed with left  And Right heart catheterization today .  Cardiology team is directing care and cardiac catheterization showed severe two-vessel disease.  Given his symptoms it was felt that he had a tachycardia mediated cardiomyopathy as his left ventricular dysfunction was out of proportion to his CAD, they have consulted EP cardiology for rate control and patient like to be started on Tikosyn.  Assessment & Plan:   Principal Problem:   NSTEMI (non-ST elevated myocardial infarction) ([place]) Active Problems:   ILD (interstitial lung disease) ([place])   Essential hypertension   Aortic aneurysm without rupture (HCC)   Osteoarthritis   Benign prostatic hyperplasia   Acute on chronic combined systolic and diastolic CHF (congestive heart failure) (HCC)   Atrial fibrillation with RVR (HCC)   Chronic respiratory failure with hypoxia ([place])  New onsetAtrial Flutterwith RVR/atrial fibrillation with RVR/atrial tachycardia -Heart rate is better controlled, but intermittently rapid.    -Continue rate control and anticoagulation.   -Cardiology is directing care.   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 -Cardiology following  -Possibly tach induced as above -Continue diuresis with Lasix 80 mg IV BID and rate control with Carvedilol 6.25 mg po BID and Metoprolol 2.5 mg IV q68mnpRN.Continues to have Lower Extremity Swelling  -Continue with Entresto as well -Continue strict I/O, daily weights; Patient is -2.955 Liters since admission -Further management depend on hospital course. -Continue to Monitor for S/Sx of Volume Overload   NSTEMI:  Troponin trended up. -No regional wall motion abnormalities were noted on echocardiogram.  -Patient is anticoagulated with heparin drip and is changed to Eliquis -[facility] -Continue with aspirin, statin, carvedilol, furosemide as well as Entresto - Mild leukocytosis, improved -Continue to monitor closely.   WBC is now 10.3 -No signs of acute infection.   -Leukocytosis has resolved.  Chronic hypoxemic respiratory failure due to ILD/asbestosis:  -Patient is on supplemental oxygen 4 L via nasal cannula per minute at baseline.   -Patient's oxygen saturation drops to the 70s with exertion.   -Patient has ILD. -Continue supplemental oxygen -Continue bronchodilators -repeat chest x-ray in a.m. -We  will need an ambulatory home O2  screen prior to discharge.  Hypertension: -Continue to optimize -Antihypertensives as above with carvedilol, Lasix as well as Entresto  Osteoarthritis. -Optimize pain control.  Acute urinary retention, history of BPH:  - Continue foley catheter placed 2/11  Macrocytic Anemia -Patient's hemoglobin/hematocrit is now 12.3/40.8 and MCV is 101.5 -Check anemia panel in a.m. -Continue to monitor for signs and symptoms of bleeding now that he is anticoagulated; currently no overt bleeding noted next-repeat CBC in the a.m.  Obesity -Estimated body mass index is 30.94 kg/m as calculated from the following:   Height as of this encounter: _0  (1.905 m).   Weight as of this encounter: 112.3 kg. -Weight Loss and Dietary Counseling given   DVT prophylaxis: Anticoagulated with Eliquis now Code Status: Full code Family Communication: Discussed with wife at bedside Disposition Plan: Patient from home and will need PT and OT evaluation prior to discharge as well as an ambulatory home O2 screen and will need cardiac clearance and medical stability before discharge disposition can be safely enacted  Consultants:   Cardiology  EP cardiology  Procedures:  Cardiac Catheterization  Prox Cx lesion is 95% stenosed.  Prox RCA lesion is 90% stenosed.  Mid RCA lesion is 75% stenosed.  LV end diastolic pressure is mildly elevated.  Hemodynamic findings consistent with moderate pulmonary hypertension.   1. Severe 2 vessel obstructive CAD. This involves the proximal LCx and a Co dominant RCA.  2. LV filling pressures are mildly elevated 17-18 mm Hg 3. Moderate pulmonary HTN with mean PAP 39 mm Hg 4. Chronic respiratory failure with pCO2 of 67 and bicarb of 40. 5. Cardiac index 2.21.   Plan: reviewed with Dr Irish Lack. Patient presented with predominant symptoms of SOB. No significant chest pain. [name] is out of proportion to his CAD and may reflect tachycardia mediated  cardiomyopathy. He has significant access issues for coronary intervention. It may be prudent at this time to maximize his CHF therapy and control his arrhythmia. If he fails to respond to these measures then PCI of the RCA and LCx could be considered. If so then I would consider access from a left radial approach. If femoral access is needed then would need to use a very long access sheath to deal with iliac tortuosity.     Antimicrobials:  Anti-infectives (From admission, onward)   None     Subjective: Seen and examined prior to his Cardiac Catheterization he is still little short of breath and his heart rate has been all over the place.  Continues to have some lower leg swelling.  No nausea or vomiting.  Denies any concerns or complaints and I think that his swelling is improved.    Objective: Vitals:   11/15/19 2028 11/16/19 0537 11/16/19 0849 11/16/19 1400  BP:  116/78 131/86   Pulse:  (!) 57 89   Resp:      Temp: 97.9 F (36.6 C) (!) 97.5 F (36.4 C)    TempSrc: Oral Oral    SpO2:  95%  90%  Weight:  112.3 kg    Height:        Intake/Output Summary (Last 24 hours) at 11/16/2019 1428 Last data filed at 11/16/2019 1200 Gross per 24 hour  Intake 243 ml  Output 1125 ml  Net -882 ml   Filed Weights   11/14/19 0515 11/15/19 0417 11/16/19 0537  Weight: 111.7 kg 111 kg 112.3 kg   Examination: Physical Exam:  Constitutional: WN/WD  obese Caucasian male in NAD and appears calm and comfortable Eyes: Lids and conjunctivae normal, sclerae anicteric  ENMT: External Ears, Nose appear normal. Grossly normal hearing.  Neck: Appears normal, supple, no cervical masses, normal ROM, no appreciable thyromegaly Respiratory: Diminished to auscultation bilaterally with coarse breath sounds and crackles. no wheezing. Normal respiratory effort and patient is not tachypenic. No accessory muscle use.  Wearing 4 L supplemental oxygen via nasal cannula Cardiovascular: Irregularly irregular mildly  tachycardic, no murmurs / rubs / gallops. S1 and S2 auscultated.  1-2+ lower extremity pitting edema extremity edema.   Abdomen: Soft, non-tender, distended second body habitus. No masses palpated. Bowel sounds positive x4.  GU: Deferred. Musculoskeletal: No clubbing / cyanosis of digits/nails. No joint deformity upper and lower extremities. Skin: No rashes, lesions, ulcers on a limited skin evaluation. No induration; Warm and dry.  Neurologic: CN 2-12 grossly intact with no focal deficits. Romberg sign and cerebellar reflexes not assessed.  Psychiatric: Normal judgment and insight. Alert and oriented x 3. Normal mood and appropriate affect.   Data Reviewed: I have personally reviewed following labs and imaging studies  CBC: Recent Labs  Lab 11/14/19 0410 11/15/19 0443 11/16/19 0348  WBC 11.1* 8.8 10.3  HGB 12.6* 12.2* 12.3*  HCT 41.1 40.1 40.8  MCV 98.6 100.5* 101.5*  PLT 235 223 045   Basic Metabolic Panel: Recent Labs  Lab 11/14/19 0410 11/15/19 0443 11/16/19 0348  NA 141 141 144  K 3.8 3.6 3.6  CL 97* 98 94*  CO2 33* 35* 40*  GLUCOSE 92 97 105*  BUN _0 CREATININE 0.95 0.98 0.92  CALCIUM 8.7* 8.8* 8.5*   GFR: Estimated Creatinine Clearance: 85.2 mL/min (by C-G formula based on SCr of 0.92 mg/dL). Liver Function Tests: No results for input(s): AST, [name], [name], [name], [name], [name], [name], [name], [name], [name], TROPONINI in the last 168 hours. BNP (last 3 results) No results for input(s): PROBNP in the last 8760 hours. HbA1C: No results for input(s): HGBA1C in the last 72 hours. CBG: No results for input(s): GLUCAP in the last 168 hours. Lipid Profile: Recent Labs    11/13/19 1726  CHOL 124   HDL 37*  LDLCALC 76  TRIG 54  CHOLHDL 3.4   Thyroid Function Tests: No results for input(s): TSH, [name], [name], [name], [name], FOLATE, FERRITIN, [name], IRON, RETICCTPCT in the last 72 hours. Sepsis Labs: No results for input(s): PROCALCITON, LATICACIDVEN in the last 168 hours.  Recent Results (from the past 240 hour(s))  Respiratory Panel by RT PCR (Flu A&B, Covid) - Nasopharyngeal Swab     Status: None   Collection Time: 11/13/19  8:40 PM   Specimen: Nasopharyngeal Swab  Result Value Ref Range Status   SARS Coronavirus 2 by RT PCR NEGATIVE NEGATIVE Final    Comment: (NOTE) SARS-CoV-2 target nucleic acids are NOT DETECTED. The SARS-CoV-2 RNA is generally detectable in upper respiratoy specimens during the acute phase of infection. The lowest concentration of SARS-CoV-2 viral copies this assay can detect is 131 copies/mL. A negative result does not preclude SARS-Cov-2 infection and should not be used as the sole basis for treatment or other patient management decisions.  A negative result may occur with  improper specimen collection/handling, submission of specimen other than nasopharyngeal swab, presence of viral mutation(s) within the areas targeted by this assay, and inadequate number of viral copies (<131 copies/mL). A negative result must be combined with clinical observations, patient history, and epidemiological information. The expected result is Negative. Fact Sheet for Patients:  PinkCheek.be Fact Sheet for Healthcare Providers:  GravelBags.it This test is not yet ap proved or cleared by the [place] FDA and  has been authorized for detection and/or diagnosis of SARS-CoV-2 by FDA under an Emergency Use Authorization (EUA). This EUA will remain  in effect (meaning this test can be used) for the duration of the COVID-19 declaration  under Section 564(b)(1) of the Act, 21 U.S.C. section 360bbb-3(b)(1), unless the authorization is terminated or revoked sooner.    Influenza A by PCR NEGATIVE NEGATIVE Final   Influenza B by PCR NEGATIVE NEGATIVE Final    Comment: (NOTE) The Xpert Xpress SARS-CoV-2/FLU/RSV assay is intended as an aid in  the diagnosis of influenza from Nasopharyngeal swab specimens and  should not be used as a sole basis for treatment. Nasal washings and  aspirates are unacceptable for Xpert Xpress SARS-CoV-2/FLU/RSV  testing. Fact Sheet for Patients: PinkCheek.be Fact Sheet for Healthcare Providers: GravelBags.it This test is not yet approved or cleared by the [place] FDA and  has been authorized for detection and/or diagnosis of SARS-CoV-2 by  FDA under an Emergency Use Authorization (EUA). This EUA will remain  in effect (meaning this test can be used) for the duration of the  Covid-19 declaration under Section 564(b)(1) of the Act, 21  U.S.C. section 360bbb-3(b)(1), unless the authorization is  terminated or revoked. Performed at [facility], [place] [address] [place], [place] [address]      RN Pressure Injury Documentation:     Estimated body mass index is 30.94 kg/m as calculated from the following:   Height as of this encounter: _0  (1.905 m).   Weight as of this encounter: 112.3 kg.  Malnutrition Type:      Malnutrition Characteristics:      Nutrition Interventions:     Radiology Studies: No results found.  Scheduled Meds: . [MAR Hold] aspirin EC  81 mg Oral Daily  . [MAR Hold] atorvastatin  40 mg Oral q1800  . [MAR Hold] carvedilol  6.25 mg Oral BID WC  . [MAR Hold] furosemide  80 mg Intravenous BID  . [MAR Hold] sacubitril-valsartan  1 tablet Oral BID  . [MAR Hold] sodium chloride flush  3 mL Intravenous Q12H  . [MAR Hold] sodium chloride flush  3 mL Intravenous Q12H   Continuous Infusions: .  [MAR Hold] sodium chloride    . sodium chloride    . sodium chloride 50 mL/hr at 11/16/19 0615  . heparin Stopped (11/16/19 1311)    LOS: 3 days   Kerney Elbe, DO [facility] PAGER is on AMION  If 7PM-7AM, please contact night-coverage [email or web address]

## 2019-11-16 NOTE — Progress Notes (Signed)
Transitions of Care Pharmacist Note  [name] is a 84 y.o. male that has been diagnosed with A Fib and will be prescribed Eliquis (apixaban) at discharge.   Patient Education: I provided the following education on Apixaban 2/15 to the patient: How to take the medication Described what the medication is Signs of bleeding Signs/symptoms of VTE and stroke  Answered their questions  Discharge Medications Plan: The patient wants to have their discharge medications filled by the Transitions of Care pharmacy rather than their usual pharmacy.  The discharge orders pharmacy has been changed to the Transitions of Care pharmacy, the patient will receive a phone call regarding co-pay, and their medications will be delivered by the Transitions of Care pharmacy.    Thank you,   Sherren Kerns, PharmD PGY1 Acute Care Pharmacy Resident November 16, 2019

## 2019-11-16 NOTE — Progress Notes (Signed)
[place] for heparin Indication: atrial fibrillation and NSTEMI  Allergies  Allergen Reactions  . Lasix [Furosemide] Diarrhea and Other (See Comments)    Additionally, this made the patient become dehydrated (eventually)    Patient Measurements: Height: _0  (190.5 cm) Weight: 247 lb 9.2 oz (112.3 kg) IBW/kg (Calculated) : 84.5 Heparin Dosing Weight: 107 kg  Vital Signs: Temp: 97.5 F (36.4 C) (02/15 0537) Temp Source: Oral (02/15 0537) BP: 116/78 (02/15 0537) Pulse Rate: 57 (02/15 0537)  Labs: Recent Labs    11/13/19 1726 11/13/19 1849 11/14/19 0410 11/14/19 0410 11/14/19 1637 11/15/19 0443 11/16/19 0348  HGB  --   --  12.6*   < >  --  12.2* 12.3*  HCT  --   --  41.1  --   --  40.1 40.8  PLT  --   --  235  --   --  223 235  HEPARINUNFRC  --   --  1.24*   < > 0.65 0.53 0.22*  CREATININE  --   --  0.95  --   --  0.98 0.92  TROPONINIHS 684* 571*  --   --   --   --   --    < > = values in this interval not displayed.    Estimated Creatinine Clearance: 85.2 mL/min (by C-G formula based on SCr of 0.92 mg/dL).  Assessment: 84 y.o. male with Afib and CHF on IV heparin.  Plans for cath 2/15 and add a DOAC after that.  -Heparin level= 0.22 and below goal  Goal of Therapy:  Heparin level 0.3-0.7 units/ml Monitor platelets by anticoagulation protocol: Yes   Plan:  -Increase heparin to 1350 units/hr -Will follow plans post cath  Hildred Laser, PharmD Clinical Pharmacist **Pharmacist phone directory can now be found on [email or web address] ([email or web address]).  Listed under [place].

## 2019-11-16 NOTE — Progress Notes (Signed)
Site area: rt groin fa shrath Site Prior to Removal:  Level 0 Pressure Applied For: 20 minutes Manual:   yes Patient Status During Pull:  stable Post Pull Site:  Level 0 Post Pull Instructions Given:  yes Post Pull Pulses Present: rt dp palpable Dressing Applied:  Gauze and tegaderm Bedrest begins @ 1610 Comments:

## 2019-11-16 NOTE — Progress Notes (Signed)
OT Cancellation Note  Patient Details Name: ZALMEN WRIGHTSMAN MRN: [id number] DOB: 15-Mar-1936   Cancelled Treatment:    Reason Eval/Treat Not Completed: Patient at procedure or test/ unavailable;Other (comment) Patient scheduled for cardiac cath today at unspecified time- spoke to RN who reports from what she knows, at this point it will likely be this morning (possibly as soon as 9am). Holding for now due to imminent procedure and likely bedrest afterwards, will continue to follow acutely.   Hendry Bancroft, [place] [phone] 11/16/2019, 9:49 AM

## 2019-11-16 NOTE — Progress Notes (Signed)
[place] for heparin Indication: atrial fibrillation and NSTEMI  Allergies  Allergen Reactions  . Lasix [Furosemide] Diarrhea and Other (See Comments)    Additionally, this made the patient become dehydrated (eventually)    Patient Measurements: Height: _0  (190.5 cm) Weight: 247 lb 9.2 oz (112.3 kg) IBW/kg (Calculated) : 84.5 Heparin Dosing Weight: 107 kg  Vital Signs: Temp: 97.5 F (36.4 C) (02/15 0537) Temp Source: Oral (02/15 0537) BP: 144/102 (02/15 1516) Pulse Rate: 86 (02/15 1516)  Labs: Recent Labs    11/13/19 1726 11/13/19 1849 11/14/19 0410 11/14/19 0410 11/14/19 1637 11/15/19 0443 11/16/19 0348  HGB  --   --  12.6*   < >  --  12.2* 12.3*  HCT  --   --  41.1  --   --  40.1 40.8  PLT  --   --  235  --   --  223 235  HEPARINUNFRC  --   --  1.24*   < > 0.65 0.53 0.22*  CREATININE  --   --  0.95  --   --  0.98 0.92  TROPONINIHS 684* 571*  --   --   --   --   --    < > = values in this interval not displayed.    Estimated Creatinine Clearance: 85.2 mL/min (by C-G formula based on SCr of 0.92 mg/dL).  Assessment: 84 y.o. male with Afib and CHF on IV heparin.  He is now s/p cath and heparin to restart 8 hours post sheath removal. Heparin was previously at goal at 1350 units/hr.  Goal of Therapy:  Heparin level 0.3-0.7 units/ml Monitor platelets by anticoagulation protocol: Yes   Plan:  -Restart heparin at 1350 units/hr 8 hours after sheath removal -Heparin level in 6 hours and daily wth CBC daily    Hildred Laser, PharmD Clinical Pharmacist **Pharmacist phone directory can now be found on [email or web address] ([email or web address]).  Listed under [place].

## 2019-11-16 NOTE — H&P (View-Only) (Signed)
 Progress Note  Patient Name: [name] Date of Encounter: 11/16/2019  Primary Cardiologist: Kardie Tobb, DO    Subjective   No chest discomfort.  No palpitations.  Chronic shortness of breath.  Inpatient Medications    Scheduled Meds: . aspirin EC  81 mg Oral Daily  . atorvastatin  40 mg Oral q1800  . carvedilol  6.25 mg Oral BID WC  . furosemide  80 mg Intravenous BID  . sacubitril-valsartan  1 tablet Oral BID  . sodium chloride flush  3 mL Intravenous Q12H  . sodium chloride flush  3 mL Intravenous Q12H   Continuous Infusions: . sodium chloride    . sodium chloride    . sodium chloride 50 mL/hr at 11/16/19 0615  . heparin 1,350 Units/hr (11/16/19 0727)   PRN Meds: sodium chloride, sodium chloride, acetaminophen, fluticasone, metoprolol tartrate, ondansetron (ZOFRAN) IV, pantoprazole, sodium chloride flush, sodium chloride flush   Vital Signs    Vitals:   11/15/19 1953 11/15/19 2028 11/16/19 0537 11/16/19 0849  BP: 110/87  116/78 131/86  Pulse:   (!) 57 89  Resp: 18     Temp:  97.9 F (36.6 C) (!) 97.5 F (36.4 C)   TempSrc:  Oral Oral   SpO2: 91%  95%   Weight:   112.3 kg   Height:        Intake/Output Summary (Last 24 hours) at 11/16/2019 0852 Last data filed at 11/16/2019 0813 Gross per 24 hour  Intake 243 ml  Output 825 ml  Net -582 ml   Last 3 Weights 11/16/2019 11/15/2019 11/14/2019  Weight (lbs) 247 lb 9.2 oz 244 lb 12.8 oz 246 lb 4.1 oz  Weight (kg) 112.3 kg 111.041 kg 111.7 kg      Telemetry    Currently normal sinus rhythm, intermittent tachycardia-due to artifact, difficult to tell underlying rhythm- Personally Reviewed  ECG    Atrial flutter with variable ventricular response- Personally Reviewed  Physical Exam   GEN: No acute distress.   Neck: No JVD Cardiac: RRR, no murmurs, rubs, or gallops.  Respiratory:  Scant wheezing to auscultation bilaterally. GI: Soft, nontender, non-distended, mildly obese MS: No edema; No  deformity. Neuro:  Nonfocal  Psych: Normal affect   Labs    High Sensitivity Troponin:   Recent Labs  Lab 11/13/19 1726 11/13/19 1849  TROPONINIHS 684* 571*      Chemistry Recent Labs  Lab 11/14/19 0410 11/15/19 0443 11/16/19 0348  NA 141 141 144  K 3.8 3.6 3.6  CL 97* 98 94*  CO2 33* 35* 40*  GLUCOSE 92 97 105*  BUN 22 22 22  CREATININE 0.95 0.98 0.92  CALCIUM 8.7* 8.8* 8.5*  GFRNONAA >60 >60 >60  GFRAA >60 >60 >60  ANIONGAP 11 8 10     Hematology Recent Labs  Lab 11/14/19 0410 11/15/19 0443 11/16/19 0348  WBC 11.1* 8.8 10.3  RBC 4.17* 3.99* 4.02*  HGB 12.6* 12.2* 12.3*  HCT 41.1 40.1 40.8  MCV 98.6 100.5* 101.5*  MCH 30.2 30.6 30.6  MCHC 30.7 30.4 30.1  RDW 15.7* 15.7* 15.5  PLT 235 223 235    BNPNo results for input(s): BNP, PROBNP in the last 168 hours.   DDimer No results for input(s): DDIMER in the last 168 hours.   Radiology    No results found.  Cardiac Studies   EF 60 to 65% in June 2020, mild calcification of the aortic valve  Patient Profile     84 y.o.   male with atrial flutter and elevated cardiac enzymes  Assessment & Plan    1.  Elevated troponin: Plan for right and left heart catheterization today.  Acute systolic heart failure with noted drop in ejection fraction from June.  Difficult to judge volume status on exam.  Change in ejection fraction may be from prolonged tachycardia.  2.   Atrial flutter: Intermittent.  He does convert to sinus.  Hopefully, will not need TEE cardioversion if he can maintain sinus.  He would qualify for anticoagulation.  Will decide on this once heart cath findings are known.  3.  Interstitial lung disease: This affects his breathing status and his stamina as well.     For questions or updates, please contact [facility] Please consult [email or web address] for contact info under        Signed, Airam Runions, MD  11/16/2019, 8:52 AM   

## 2019-11-16 NOTE — Discharge Instructions (Signed)

## 2019-11-16 NOTE — Progress Notes (Addendum)
Progress Note  Patient Name: [name] Date of Encounter: 11/16/2019  Primary Cardiologist: Berniece Salines, DO    Subjective   No chest discomfort.  No palpitations.  Chronic shortness of breath.  Inpatient Medications    Scheduled Meds: . aspirin EC  81 mg Oral Daily  . atorvastatin  40 mg Oral q1800  . carvedilol  6.25 mg Oral BID WC  . furosemide  80 mg Intravenous BID  . sacubitril-valsartan  1 tablet Oral BID  . sodium chloride flush  3 mL Intravenous Q12H  . sodium chloride flush  3 mL Intravenous Q12H   Continuous Infusions: . sodium chloride    . sodium chloride    . sodium chloride 50 mL/hr at 11/16/19 0615  . heparin 1,350 Units/hr (11/16/19 0727)   PRN Meds: sodium chloride, sodium chloride, acetaminophen, fluticasone, metoprolol tartrate, ondansetron (ZOFRAN) IV, pantoprazole, sodium chloride flush, sodium chloride flush   Vital Signs    Vitals:   11/15/19 1953 11/15/19 2028 11/16/19 0537 11/16/19 0849  BP: 110/87  116/78 131/86  Pulse:   (!) 57 89  Resp: 18     Temp:  97.9 F (36.6 C) (!) 97.5 F (36.4 C)   TempSrc:  Oral Oral   SpO2: 91%  95%   Weight:   112.3 kg   Height:        Intake/Output Summary (Last 24 hours) at 11/16/2019 0852 Last data filed at 11/16/2019 0813 Gross per 24 hour  Intake 243 ml  Output 825 ml  Net -582 ml   Last 3 Weights 11/16/2019 11/15/2019 11/14/2019  Weight (lbs) 247 lb 9.2 oz 244 lb 12.8 oz 246 lb 4.1 oz  Weight (kg) 112.3 kg 111.041 kg 111.7 kg      Telemetry    Currently normal sinus rhythm, intermittent tachycardia-due to artifact, difficult to tell underlying rhythm- Personally Reviewed  ECG    Atrial flutter with variable ventricular response- Personally Reviewed  Physical Exam   GEN: No acute distress.   Neck: No JVD Cardiac: RRR, no murmurs, rubs, or gallops.  Respiratory:  Scant wheezing to auscultation bilaterally. GI: Soft, nontender, non-distended, mildly obese MS: No edema; No  deformity. Neuro:  Nonfocal  Psych: Normal affect   Labs    High Sensitivity Troponin:   Recent Labs  Lab 11/13/19 1726 11/13/19 1849  TROPONINIHS 684* 571*      Chemistry Recent Labs  Lab 11/14/19 0410 11/15/19 0443 11/16/19 0348  NA 141 141 144  K 3.8 3.6 3.6  CL 97* 98 94*  CO2 33* 35* 40*  GLUCOSE 92 97 105*  BUN _0 CREATININE 0.95 0.98 0.92  CALCIUM 8.7* 8.8* 8.5*  GFRNONAA >60 >60 >60  GFRAA >60 >60 >60  ANIONGAP _1 Hematology Recent Labs  Lab 11/14/19 0410 11/15/19 0443 11/16/19 0348  WBC 11.1* 8.8 10.3  RBC 4.17* 3.99* 4.02*  HGB 12.6* 12.2* 12.3*  HCT 41.1 40.1 40.8  MCV 98.6 100.5* 101.5*  MCH 30.2 30.6 30.6  MCHC 30.7 30.4 30.1  RDW 15.7* 15.7* 15.5  PLT 235 223 235    BNPNo results for input(s): BNP, PROBNP in the last 168 hours.   DDimer No results for input(s): DDIMER in the last 168 hours.   Radiology    No results found.  Cardiac Studies   EF 60 to 65% in June 2020, mild calcification of the aortic valve  Patient Profile     84 y.o.  male with atrial flutter and elevated cardiac enzymes  Assessment & Plan    1.  Elevated troponin: Plan for right and left heart catheterization today.  Acute systolic heart failure with noted drop in ejection fraction from June.  Difficult to judge volume status on exam.  Change in ejection fraction may be from prolonged tachycardia.  2.   Atrial flutter: Intermittent.  He does convert to sinus.  Hopefully, will not need TEE cardioversion if he can maintain sinus.  He would qualify for anticoagulation.  Will decide on this once heart cath findings are known.  3.  Interstitial lung disease: This affects his breathing status and his stamina as well.     For questions or updates, please contact [place] Please consult [email or web address] for contact info under        Signed, Larae Grooms, MD  11/16/2019, 8:52 AM

## 2019-11-16 NOTE — Interval H&P Note (Signed)
History and Physical Interval Note:  11/16/2019 2:03 PM  [name]  has presented today for surgery, with the diagnosis of NSTEMI.  The various methods of treatment have been discussed with the patient and family. After consideration of risks, benefits and other options for treatment, the patient has consented to  Procedure(s): RIGHT/LEFT HEART CATH AND CORONARY ANGIOGRAPHY (N/A) as a surgical intervention.  The patient's history has been reviewed, patient examined, no change in status, stable for surgery.  I have reviewed the patient's chart and labs.  Questions were answered to the patient's satisfaction.    Cath Lab Visit (complete for each Cath Lab visit)  Clinical Evaluation Leading to the Procedure:   ACS: No.  Non-ACS:    Anginal Classification: CCS II  Anti-ischemic medical therapy: Minimal Therapy (1 class of medications)  Non-Invasive Test Results: No non-invasive testing performed  Prior CABG: No previous CABG       [name] 11/16/2019 2:03 PM

## 2019-11-16 NOTE — Progress Notes (Signed)
PT Cancellation Note  Patient Details Name: [name] MRN: [id number] DOB: May 09, 1936   Cancelled Treatment:    Reason Eval/Treat Not Completed: Other (comment) patient scheduled for cardiac cath today at unspecified time- spoke to RN who reports from what she knows, at this point it will likely be this morning (possibly as soon as 9am). Holding for now due to imminent procedure and likely bedrest afterwards, will continue to follow acutely.   Windell Norfolk, DPT, PN1   Supplemental Physical Therapist [facility]    Pager [phone] Acute Rehab Office [phone]

## 2019-11-16 NOTE — Consult Note (Addendum)
Cardiology Consultation:   Patient ID: [name] MRN: [id number]; DOB: April 22, 1936  Admit date: 11/13/2019 Date of Consult: 11/16/2019  Primary Care Provider: [name], MD Primary Cardiologist: Berniece Salines, DO (new) Primary Electrophysiologist:  None    Patient Profile:   [name] is a 84 y.o. male with a hx of COPD, Pulmonary fibrosis on home O2. HTN, HFpEF, OA who is being seen today for the evaluation of AFlutter, rhythm management at the request of Dr. Irish Lack.  History of Present Illness:   Mr. Sarr initially sought medical attention at [facility] for fast HR noted at home that started when he got  Up to use the bathroom, also mentioned noting increased LE swelling over about a weeks time.  No reported CP or SOB  Initially though to be an SVT was given adenosine and IV with no reported effect, started on dilt and the rhythm slowed revealing AFlutter.  Cardiology was consulted to the case, TTE noted LVEF reduced from normal in 2020 to 30-35%, some elevation in Trop 0.8> 1.09> 1.56> 1.80, planned to transfer to [facility] for cath and further management He was initially treated with diltiazem though switched to BB and heparin for a/c, and lasix for diuresis.  He went for [facility] today   Prox Cx lesion is 95% stenosed.  Prox RCA lesion is 90% stenosed.  Mid RCA lesion is 75% stenosed.  LV end diastolic pressure is mildly elevated.  Hemodynamic findings consistent with moderate pulmonary hypertension.   1. Severe 2 vessel obstructive CAD. This involves the proximal LCx and a Co dominant RCA.  2. LV filling pressures are mildly elevated 17-18 mm Hg 3. Moderate pulmonary HTN with mean PAP 39 mm Hg 4. Chronic respiratory failure with pCO2 of 67 and bicarb of 40. 5. Cardiac index 2.21.   Plan: reviewed with Dr Irish Lack. Patient presented with predominant symptoms of SOB. No significant chest pain. [name] is out of proportion to his CAD and may reflect  tachycardia mediated cardiomyopathy. He has significant access issues for coronary intervention. It may be prudent at this time to maximize his CHF therapy and control his arrhythmia. If he fails to respond to these measures then PCI of the RCA and LCx could be considered. If so then I would consider access from a left radial approach. If femoral access is needed then would need to use a very long access sheath to deal with iliac tortuosity.   EP is asked to weigh in for rhythm control strategy  LABS today K_+ 3.6, 3.5, 3.4 BUN/Creat 22/0.92 WBC 10.3 H/H 13/40 Plts 235   COVID/flu negative   [place] records reviewed Echo noted below B/l LE venous US negative for DVT TSH 1.95  He is post cath, at his baseline breathing currently, no CP, no palpitations currently  Heart Pathway Score:     Past Medical History:  Diagnosis Date  . AAA (abdominal aortic aneurysm) ([place])   . Allergic rhinitis   . BPH (benign prostatic hypertrophy)   . Colon polyp   . Hypertension   . Osteoarthritis     Past Surgical History:  Procedure Laterality Date  . CATARACT EXTRACTION, BILATERAL    . COLONOSCOPY    . HERNIA REPAIR     as child  . REPLACEMENT TOTAL KNEE Left   . TONSILLECTOMY       Home Medications:  Prior to Admission medications   Medication Sig Start Date End Date Taking? Authorizing Provider  acetaminophen (TYLENOL) 650  MG CR tablet Take 650 mg by mouth every 8 (eight) hours as needed for pain (or headaches).    Yes [provider]  amLODipine (NORVASC) 2.5 MG tablet Take 2.5 mg by mouth daily. 11/02/19  Yes [provider]  diphenhydramine-acetaminophen (TYLENOL PM) 25-500 MG TABS tablet Take 1 tablet by mouth at bedtime as needed (for sleep).   Yes [provider]  fluticasone (FLONASE) 50 MCG/ACT nasal spray Place 2 sprays into both nostrils daily as needed (for seasonal allergies).  02/13/16  Yes [provider]  furosemide (LASIX) 80 MG tablet  Take 40 mg by mouth daily.  03/27/19  Yes [provider]  INCRUSE ELLIPTA 62.5 MCG/INH AEPB Inhale 1 puff into the lungs daily. 10/07/19  Yes [provider]  Javier Docker Oil 350 MG CAPS Take 350 mg by mouth daily with breakfast.    Yes [provider]  losartan (COZAAR) 50 MG tablet Take 50 mg by mouth daily.  04/01/16  Yes [provider]  Multiple Vitamins-Minerals (ICAPS) CAPS Take 1 capsule by mouth daily with breakfast.   Yes [provider]  naproxen sodium (ALEVE) 220 MG tablet Take 220-440 mg by mouth 2 (two) times daily as needed (for pain or headaches).   Yes [provider]  omeprazole (PRILOSEC) 20 MG capsule Take 1 capsule (20 mg total) by mouth daily. Patient taking differently: Take 20 mg by mouth daily as needed (for GERD-like symptoms).  01/28/19  Yes Fenton Foy, NP    Inpatient Medications: Scheduled Meds: . [MAR Hold] aspirin EC  81 mg Oral Daily  . [MAR Hold] atorvastatin  40 mg Oral q1800  . [MAR Hold] carvedilol  6.25 mg Oral BID WC  . [MAR Hold] furosemide  80 mg Intravenous BID  . [MAR Hold] sacubitril-valsartan  1 tablet Oral BID  . [MAR Hold] sodium chloride flush  3 mL Intravenous Q12H  . [MAR Hold] sodium chloride flush  3 mL Intravenous Q12H   Continuous Infusions: . [MAR Hold] sodium chloride    . sodium chloride    . sodium chloride 50 mL/hr at 11/16/19 0615  . heparin Stopped (11/16/19 1311)   PRN Meds: [HER Hold] sodium chloride, sodium chloride, [MAR Hold] acetaminophen, [MAR Hold] fluticasone, [MAR Hold] metoprolol tartrate, [MAR Hold] ondansetron (ZOFRAN) IV, [MAR Hold] pantoprazole, [MAR Hold] sodium chloride flush, sodium chloride flush  Allergies:    Allergies  Allergen Reactions  . Lasix [Furosemide] Diarrhea and Other (See Comments)    Additionally, this made the patient become dehydrated (eventually)    Social History:   Social History   Socioeconomic History  . Marital status: Married     Spouse name: Not on file  . Number of children: 2  . Years of education: Not on file  . Highest education level: Not on file  Occupational History  . Occupation: retired  Tobacco Use  . Smoking status: Former Smoker    Packs/day: 1.00    Years: 10.00    Pack years: 10.00    Types: Cigarettes, Pipe, Cigars    Start date: 10/02/1955    Quit date: 10/01/1965    Years since quitting: 54.1  . Smokeless tobacco: Former Systems developer    Types: Chew  Substance and Sexual Activity  . Alcohol use: No  . Drug use: No  . Sexual activity: Not on file  Other Topics Concern  . Not on file  Social History Narrative   Originally from [place]. Has always lived in [place]. Served  in the [employer] and has traveled aboard ship extensively. 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 Has asbestos exposure through his work for 3.5 years from 1957-1961. 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 No mold or bird exposure. Enjoys golfing.    Social Determinants of Health   Financial Resource Strain:   . Difficulty of Paying Living Expenses: Not on file  Food Insecurity:   . Worried About Charity fundraiser in the Last Year: Not on file  . Ran Out of Food in the Last Year: Not on file  Transportation Needs:   . Lack of Transportation (Medical): Not on file  . Lack of Transportation (Non-Medical): Not on file  Physical Activity:   . Days of Exercise per Week: Not on file  . Minutes of Exercise per Session: Not on file  Stress:   . Feeling of Stress : Not on file  Social Connections:   . Frequency of Communication with Friends and Family: Not on file  . Frequency of Social Gatherings with Friends and Family: Not on file  . Attends Religious Services: Not on file  . Active Member of Clubs or Organizations: Not on file  . Attends Archivist Meetings: Not on file  . Marital Status: Not on file  Intimate Partner Violence:   . Fear of Current or  Ex-Partner: Not on file  . Emotionally Abused: Not on file  . Physically Abused: Not on file  . Sexually Abused: Not on file    Family History:    Family History  Problem Relation Age of Onset  . Heart disease Mother   . Leukemia Father   . Lung disease Neg Hx   . Rheumatologic disease Neg Hx      ROS:  Please see the history of present illness.  All other ROS reviewed and negative.     Physical Exam/Data:   Vitals:   11/16/19 1501 11/16/19 1506 11/16/19 1511 11/16/19 1516  BP: 140/82 (!) 135/94 (!) 136/92 (!) 144/102  Pulse: 78 78 69 86  Resp: 19 (!) 42 (!) 37 (!) 35  Temp:      TempSrc:      SpO2: 90% 92% 92% 91%  Weight:      Height:        Intake/Output Summary (Last 24 hours) at 11/16/2019 1534 Last data filed at 11/16/2019 1200 Gross per 24 hour  Intake 243 ml  Output 1125 ml  Net -882 ml   Last 3 Weights 11/16/2019 11/15/2019 11/14/2019  Weight (lbs) 247 lb 9.2 oz 244 lb 12.8 oz 246 lb 4.1 oz  Weight (kg) 112.3 kg 111.041 kg 111.7 kg     Body mass index is 30.94 kg/m.  General:  Well nourished, well developed, chronically ill appearing HEENT: normal Lymph: no adenopathy Neck: no JVD Endocrine:  No thryomegaly Vascular: No carotid bruits; Cardiac: RRR; 1-2/6 SM Lungs:  Soft scattered exp wheezes,  No rhonchi or rales  Abd: soft, nontender, obese  Ext: trace edema Musculoskeletal:  No deformities Skin: warm and dry  Neuro:  no gross focal abnormalities noted Psych:  Normal affect   EKG:  The EKG was personally reviewed and demonstrates:   11/12/19 @ 12:02 SVT 152bpm  11/12/19 @ 13:07 AFlutter vs caorse AFib 78bpm 11/13/19 @ 07:23 AFlutter, atypical, 77bpm  09/14/20 now, SR, 1st degree AVblock, PR 276m, 09/14/20 now is SVT 114bpm, looks an ATach (  short RP)    Telemetry:  Telemetry was personally reviewed and demonstrates:    generally rate controlled Aflutter 70's-90's, he has also had AFib 90,sof late he is in/out of SR and an  SVT/ATach    Relevant CV Studies:  CXR report from [facility]: FINDINGS: Stable cardiomegaly. No pneumothorax or pleural effusion is noted. Stable interstitial densities are noted throughout both lungs most consistent with chronic interstitial lung disease or scarring. No definite acute abnormality is noted. Bony thorax is unremarkable.  IMPRESSION: Stable interstitial densities are noted throughout both lungs most consistent with chronic interstitial lung disease or scarring.  Echocardiogram report from [facility]:  RVIDd: 3.6 cm LVOT Diam: 2.0 cm IVSd: 1.4 cm LVIDd: 5.0 cm LVPWd: 1.4 cm LVIDs: 3.9 cm EF(Teich): 43.01 % LA Diam: 4.2 cm Ao Diam: 3.3 cm LAESV MOD A4C: 86.3 ml LAESV MOD A2C: 122.7 ml LAESV Index (A-L): 48.69 ml/m ------------ DOPPLER MV E Vel: 1.25 m/s MV A Vel: 0.00 m/s MV PHT: 33.51 ms MVA By PHT: 6.57 cm LVOT Vmax: 0.97 m/s AV Vmax: 2.75 m/s AVA Vmax: 1.10 cm AVA (VTI): 1.06 cm TR Vmax: 3.03 m/s TR maxPG: 37 mmHg RVSP: 51.65 mmHg   FINDINGS ------- Procedure:2D images, m-mode, color and spectral Doppler were obtained and reviewed. ECG rhythm:Atrial fibrillation. Study quality:This was a technically adequate study. Left Ventricle:The left ventricular size is normal.   There is moderate concentric left ventricular hypertrophy.   Overall left ventricular systolic function is moderate-severely impaired with, an EF between 30 - 35 %.   No obvious regional wall motion abnormalities were noted.  Unable to determine diastolic function dueto rhtyhm. Right Ventricle:The right ventricle is moderately enlarged.   The right ventricular systolic function is moderately impaired. Left Atrium:Left atrium is moderately dilated by volume. Right Atrium:The right atrium is moderately enlarged. ASD/VSD:No evidence of interatrial communication by color flow doppler analysis. Aortic Valve:The aortic valve is trileaflet and appears structurally normal.   There is mild aortic  regurgitation.  Mild to m  oderate aortic stenosis with peak/mean pressure gradient of {30 mmhg maxPG / {18 mmhg  meanPG, the aortic valve area by continuity equation is 1.1 cm square.  NDI = 0.34  ( low flow ) Mitral Valve:Normal appearing mitral valve.    Moderate mitral regurgitation is present. Tricuspid Valve:The tricuspid valve appears structurally normal.   Moderate tricuspid regurgitation present.   The right ventricular systolic pressure, as measured by Doppler, is 52 mmhg. Pulmonic Valve:The pulmonic valve is normal.   Trace/mild (physiologic)  pulmonic regurgitation. Aorta:The aortic root, ascending aorta and aortic arch appear normal. IVC:The inferior vena cava is dilated with no significant inspiratory collapse which is consistent estimated right atrial pressure of 15 mmHg. Pericardium:There is no pericardial effusion.  CONCLUSIONS ----------- 1. This was a technically adequate study. 2. The left ventricular size is normal. 3. There is moderate concentric left ventricular hypertrophy. 4. Overall left ventricular systolic function is moderate-severely impaired with, an EF between 30 - 35 %. 5. The right ventricle is moderately enlarged. 6. The right ventricular systolic function is moderately impaired. 7. Left atrium is moderately dilated by volume. 8. The right atrium is moderately enlarged. 9. There is mild aortic regurgitation. 10. Mild to moderate aortic stenosis with peak/mean pressure gradient of 30 mmhg maxPG / {18 mmhg  meanPG, the aortic valve area by continuity equation is 1.1 cm square.  NDI = 0.34  ( low flow ) 11. Moderate mitral regurgitation is present. 12. Moderate tricuspid regurgitation present. 13.  The right ventricular systolic pressure, as measured by Doppler, is 52 mmhg. 14. Trace/mild (physiologic)  pulmonic regurgitation. 15. The aortic root, ascending aorta and aortic arch appear normal. 16. There is no pericardial effusion.  Laboratory Data:  High  Sensitivity Troponin:   Recent Labs  Lab 11/13/19 1726 11/13/19 1849  TROPONINIHS 684* 571*     Chemistry Recent Labs  Lab 11/14/19 0410 11/15/19 0443 11/16/19 0348  NA 141 141 144  K 3.8 3.6 3.6  CL 97* 98 94*  CO2 33* 35* 40*  GLUCOSE 92 97 105*  BUN _0 CREATININE 0.95 0.98 0.92  CALCIUM 8.7* 8.8* 8.5*  GFRNONAA >60 >60 >60  GFRAA >60 >60 >60  ANIONGAP _1 No results for input(s): [name], ALBUMIN, AST, [name], [name], [name] in the last 168 hours. Hematology Recent Labs  Lab 11/14/19 0410 11/15/19 0443 11/16/19 0348  WBC 11.1* 8.8 10.3  RBC 4.17* 3.99* 4.02*  HGB 12.6* 12.2* 12.3*  HCT 41.1 40.1 40.8  MCV 98.6 100.5* 101.5*  MCH 30.2 30.6 30.6  MCHC 30.7 30.4 30.1  RDW 15.7* 15.7* 15.5  PLT 235 223 235   BNPNo results for input(s): BNP, PROBNP in the last 168 hours.  DDimer No results for input(s): DDIMER in the last 168 hours.   Radiology/Studies:  No results found. {   Assessment and Plan:   1. New onset atrial arrhythmias     AFlutter     Afib     Likely an ATach as well  CHA2DS2Vasc is 5, started on heparin gtt this stay >> recommend [place] post cath He has RV pressure of 51.55mHg, O2 dependent pul;monary disease RA and LA described as moderately enlarged, LA measured 438mwith mod MR Maintaining rhythm long term mat prove difficult and will llikely require AAD.  He is in/out of his AT most recently His AAD choices would be Tikosyn  Amiodarone (less favorable given lung disease)   I have ordered K+ replacement, mag level and K+ follow level in preparation for Tikosyn start, hopefully we can get him going tomorrow I have reached out/discussed with pharmacist (and officially consulted) if they could aid usKorean electrolyte replacement tonight (using the Tikosyn electrolyte replacement protocol) to help this evening in continuing any further electrolyte replplenishment needed We very much appreciate their assist I have reviewed med list  and do not note any contraindicated meds in or out patient   I have d/c the zofran (he has not gotten any)   Will start Eliquis tonight, d/w Dr. JoMartiniquept had (attenmpted radial approach) fem access, notfelt or planned for coronary interventions at least in the short term [facility] I[facility] hours post sheath pull   Continue with primary care team otherwise:   2. Acute CHF exacerbation 3. NICM     Cath note feels CM is out of proportion to his CAD 4. CAD 5. Chronic resp failure 6. COPD 7. ILD     On home O2  For questions or updates, please contact [facility] Please consult [email or web address] for contact info under   Signed, ReBaldwin JamaicaPA-C  11/16/2019 3:34 PM  Atrial flutters- multiple; atrial fibrillation and atrial tachycardia  Ischemic heart disease//ischemic-nonischemic cardiomyopathy-new   O2 dependent COPD/Pulm fibrosis  CHF acute chronic systolic   QTNBZ--967sec * [place] calculation  Pt with recurrent multiple atrial arrhythmias with RVR and newly identified, and presumably (hopefully) secondary LV dysfunction.  Control will be helpful as  well as for assoc symptoms.  Drug options include amio with lung risks and dofetilide-- favor the latter and have reviewed proarrhythmic risk with him.  Currently low K and need to assess mag And will plan to begin tomorrow  CrCL > 60 so will use 500 mcg.

## 2019-11-17 ENCOUNTER — Other Ambulatory Visit: Payer: Self-pay | Admitting: Nurse Practitioner

## 2019-11-17 LAB — CBC
HCT: 41.5 % (ref 39.0–52.0)
Hemoglobin: 12.3 g/dL — ABNORMAL LOW (ref 13.0–17.0)
MCH: 30.1 pg (ref 26.0–34.0)
MCHC: 29.6 g/dL — ABNORMAL LOW (ref 30.0–36.0)
MCV: 101.7 fL — ABNORMAL HIGH (ref 80.0–100.0)
Platelets: 237 10*3/uL (ref 150–400)
RBC: 4.08 MIL/uL — ABNORMAL LOW (ref 4.22–5.81)
RDW: 15.7 % — ABNORMAL HIGH (ref 11.5–15.5)
WBC: 10.9 10*3/uL — ABNORMAL HIGH (ref 4.0–10.5)
nRBC: 0 % (ref 0.0–0.2)

## 2019-11-17 LAB — BASIC METABOLIC PANEL
Anion gap: 9 (ref 5–15)
BUN: 15 mg/dL (ref 8–23)
CO2: 38 mmol/L — ABNORMAL HIGH (ref 22–32)
Calcium: 8.6 mg/dL — ABNORMAL LOW (ref 8.9–10.3)
Chloride: 95 mmol/L — ABNORMAL LOW (ref 98–111)
Creatinine, Ser: 0.88 mg/dL (ref 0.61–1.24)
GFR calc Af Amer: >60 mL/min (ref >60)
GFR calc non Af Amer: >60 mL/min (ref >60)
Glucose, Bld: 96 mg/dL (ref 70–99)
Potassium: 4.5 mmol/L (ref 3.5–5.1)
Sodium: 142 mmol/L (ref 135–145)

## 2019-11-17 LAB — MAGNESIUM: Magnesium: 2.4 mg/dL (ref 1.7–2.4)

## 2019-11-17 MED ORDER — SODIUM CHLORIDE 0.9% FLUSH: 3.0000 mL | INTRAVENOUS | Status: DC | PRN

## 2019-11-17 MED ORDER — DOFETILIDE 500 MCG PO CAPS
500.0000 ug | ORAL_CAPSULE | Freq: Two times a day (BID) | ORAL | Status: DC
  Administered 2019-11-17: 500 ug via ORAL
  Filled 2019-11-17: qty 1

## 2019-11-17 MED ORDER — SODIUM CHLORIDE 0.9% FLUSH
3.0000 mL | Freq: Two times a day (BID) | INTRAVENOUS | Status: DC
  Administered 2019-11-17 – 2019-11-19 (×5): 3 mL via INTRAVENOUS

## 2019-11-17 MED ORDER — SODIUM CHLORIDE 0.9 % IV SOLN: 250.0000 mL | INTRAVENOUS | Status: DC | PRN

## 2019-11-17 NOTE — Progress Notes (Signed)
Physical Therapy Treatment Patient Details Name: [name] MRN: [id number] DOB: 1936-05-27 Today's Date: 11/17/2019    History of Present Illness Pt is a 84 y.o. M with significant PMH of chronic HFpEF, 4L O2 dependent pulmonary fibrosis, and HTN who was admitted with ventricular rate in 160s and new onset atrial flutter with RVR. ECG showed newly depressed LV systolic function LVEF 33-29%.     PT Comments    Patient self limiting as reports already ambulated today (?with nursing).  He did participate with seated exercise and sit to stands.  SpO2 dropping to 83% on 4-5L O2.  Will continue skilled PT during acute stay and follow up HHPT recommended.    Follow Up Recommendations  Home health PT;Supervision for mobility/OOB     Equipment Recommendations  None recommended by PT    Recommendations for Other Services       Precautions / Restrictions Precautions Precautions: Fall Precaution Comments: 4L O2 baseline    Mobility  Bed Mobility Overal bed mobility: Modified Independent                Transfers Overall transfer level: Needs assistance Equipment used: 4-wheeled walker Transfers: Sit to/from Stand Sit to Stand: Min guard;From elevated surface         General transfer comment: cues for hand placement, assist for balance, cues for controlled lowering back to bed  Ambulation/Gait             General Gait Details: deferred, pt reports already walking in hallway today   Stairs             Wheelchair Mobility    Modified Rankin (Stroke Patients Only)       Balance Overall balance assessment: Needs assistance   Sitting balance-Leahy Scale: Good     Standing balance support: Bilateral upper extremity supported Standing balance-Leahy Scale: Poor Standing balance comment: relies on UE support for balance                            Cognition Arousal/Alertness: Awake/alert Behavior During Therapy: WFL for tasks  assessed/performed Overall Cognitive Status: Within Functional Limits for tasks assessed                                        Exercises General Exercises - Lower Extremity Long Arc Quad: Strengthening;10 reps;Seated;Both Hip ABduction/ADduction: Strengthening;10 reps;Both;Seated Hip Flexion/Marching: Strengthening;10 reps;Seated;Both Toe Raises: Strengthening;15 reps;Seated;Both Other Exercises Other Exercises: 3x sit to stand Other Exercises: 5 reps 5 sec hold trunk extension with scapular squeeze    General Comments General comments (skin integrity, edema, etc.): on 4-5 L O2; SpO2 83% at lowest with seated exercise, mostly 88-91% throughout      Pertinent Vitals/Pain Pain Assessment: Faces Faces Pain Scale: Hurts little more Pain Location: L foot Pain Descriptors / Indicators: Sore(sore where doctor checking swelling) Pain Intervention(s): Monitored during session;Repositioned    Home Living                      Prior Function            PT Goals (current goals can now be found in the care plan section) Progress towards PT goals: Progressing toward goals    Frequency    Min 3X/week      PT Plan Current plan remains appropriate    Co-evaluation  AM-PAC PT "6 Clicks" Mobility   Outcome Measure  Help needed turning from your back to your side while in a flat bed without using bedrails?: None Help needed moving from lying on your back to sitting on the side of a flat bed without using bedrails?: None Help needed moving to and from a bed to a chair (including a wheelchair)?: A Little Help needed standing up from a chair using your arms (e.g., wheelchair or bedside chair)?: A Little Help needed to walk in hospital room?: A Little Help needed climbing 3-5 steps with a railing? : A Little 6 Click Score: 20    End of Session Equipment Utilized During Treatment: Oxygen Activity Tolerance: Patient limited by  fatigue Patient left: in bed;with call bell/phone within reach(seated EOB to eat with wife)   PT Visit Diagnosis: Unsteadiness on feet (R26.81);Difficulty in walking, not elsewhere classified (R26.2)     Time: 7858-8502 PT Time Calculation (min) (ACUTE ONLY): 17 min  Charges:  $Therapeutic Exercise: 8-22 mins                     Magda Kiel, [place] [facility] [phone] 11/17/2019    Reginia Naas 11/17/2019, 4:37 PM

## 2019-11-17 NOTE — Progress Notes (Signed)
T BAND REMOVAL  LOCATION:    right radial  DEFLATED PER PROTOCOL:    Yes.    TIME BAND OFF / DRESSING APPLIED:    2100   SITE UPON ARRIVAL:    Level 0  SITE AFTER BAND REMOVAL:    Level 0  CIRCULATION SENSATION AND MOVEMENT:    Within Normal Limits   Yes.    COMMENTS:  Pt.tolerated well;no bleeding or hematoma noted.

## 2019-11-17 NOTE — Progress Notes (Signed)
PROGRESS NOTE    [name]  XTK:[id number] DOB: [birth year] DOA: 11/13/2019 PCP: [name], MD  Brief Narrative:  Patient is an 84 year old Caucasian male, obese, with past medical history significant for chronic HFpEF, 4L O2-dependentpulmonary fibrosis and hypertension.  Patient presented initially to [facility] emergency room with 1 week history of worsening lower extremity edema, and atrial fibrillation with rapid ventricular response.  Heart rate control has improved with Cardizem.   Patient is also on heparin drip.  Troponin is elevated.  Echocardiogram showed newly depressed LV systolic function (LVEF 99-24%, down from 60-65% June 2020).  Patient was transferred from [facility] to [facility] for possible left heart catheterization.  The plan is to proceed with left  And Right heart catheterization today .  Cardiology team is directing care and cardiac catheterization showed severe two-vessel disease.  Given his symptoms it was felt that he had a tachycardia mediated cardiomyopathy as his left ventricular dysfunction was out of proportion to his CAD, they have consulted EP cardiology for rate control and patient like to be started on Tikosyn.  He started on 500 mg today and then post dose EKG was reviewed and EP cardiologist: Stop the dofetilide dose tonight and check EKG in the morning consider resuming it 250 mcg p.o in the AM   Assessment & Plan:   Principal Problem:   NSTEMI (non-ST elevated myocardial infarction) ([name]) Active Problems:   ILD (interstitial lung disease) ([place])   Essential hypertension   Aortic aneurysm without rupture (HCC)   Osteoarthritis   Benign prostatic hyperplasia   Acute on chronic combined systolic and diastolic CHF (congestive heart failure) (HCC)   Atrial fibrillation with RVR (HCC)   Chronic respiratory failure with hypoxia ([place])  New onsetAtrial Flutterwith RVR/atrial fibrillation with RVR/atrial tachycardia -Heart rate  is better controlled, but intermittently rapid.    -Continue rate control and anticoagulation.   -Cardiology is directing care.   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 and states that he has significant access issues for coronary intervention they feel that it is prudent to maximize his CHF therapy and control his arrhythmia  -EP cardiology was consulted for further evaluation recommendations for help with his arrhythmia and he was started on Tikosyn but post dose EKG was reviewed and it showed a prolonged QTC with brief and frequent bigeminy saw EP cardiology stop the dofetilide tonight and will check an EKG in the morning and consider resuming 250 mcg  -He is going to be anticoagulated with Eliquis he was recently just on heparin drip as CHA2DS2-VASc was 5 -Dofetilide electrolyte replacement protocol initiated by Cardiology   Acute Combined Systolic and Diastolic CHF:  -Newly depressed LV systolic function (LVEF 26%). -Cardiology following  -Possibly tach induced as above -Continue diuresis with Lasix 80 mg IV BID and rate control with Carvedilol 6.25 mg po BID and Metoprolol 2.5 mg IV q48mnpRN.Continues to have Lower Extremity Swelling  -Continue with Entresto as well -Continue strict I/O, daily weights; Patient is -4.170 Liters since admission -Further management depend on hospital course. -Continue to Monitor for S/Sx of Volume Overload; Weigh is down 6 lbs  -Cardiology following and appreciate further evaluation and recommendations   NSTEMI:  -Troponin trended up. -No regional wall motion abnormalities were noted on echocardiogram.  -Patient  is anticoagulated with heparin drip and is changed to Eliquis -Patient was transferred from [facility] and  underent Cardiac Catheterization -Cath showed Severe 2 Vessel Disease with 90% stenosis of the Proximal RCA and and 95% stenosis in the Proximal Cx -Continue with aspirin, statin, carvedilol, furosemide as well as Entresto - Mild leukocytosis, improved initially but now slightly trending up -Continue to monitor closely.   WBC went from 10.3 -> 10.9 -No signs of acute infection.   -Leukocytosis has resolved.  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   -Patient's oxygen saturation drops to the 70s with exertion.   -Patient has ILD. -Continue supplemental oxygen -Continue bronchodilators -Repeat chest x-ray in a.m. -We will need an ambulatory home O2 screen prior to discharge.  Hypertension: -Continue to optimize -Antihypertensives as above with carvedilol, Lasix as well as Entresto 24-26 mg po 1 tab BUD   Osteoarthritis. -Optimize pain control.  Acute urinary retention, history of BPH:  -Continue foley catheter placed 2/11 -Will need TOV  Macrocytic Anemia -Patient's hemoglobin/hematocrit is now 12.3/41.5 and MCV is 101.7 -Check anemia panel in a.m. -Continue to monitor for signs and symptoms of bleeding now that he is anticoagulated; currently no overt bleeding noted  -Repeat CBC in the a.m.  Obesity -Estimated body mass index is 30.19 kg/m as calculated from the following:   Height as of this encounter: _0  (1.905 m).   Weight as of this encounter: 109.5 kg. -Weight Loss and Dietary Counseling given   DVT prophylaxis: Anticoagulated with Eliquis now Code Status: Full code Family Communication: No family present at bedside  Disposition Plan: Patient from home and will need PT and OT evaluation prior to discharge as well as an ambulatory home O2 screen and will need cardiac clearance and medical stability before discharge disposition can be safely enacted; He still  remains volume overloaded and is going to get diuresed back to Dry weight and also is being started on Tikosyn with close Cardiac Monitoring  Consultants:   Cardiology  EP cardiology  Procedures:  Cardiac Catheterization  Prox Cx lesion is 95% stenosed.  Prox RCA lesion is 90% stenosed.  Mid RCA lesion is 75% stenosed.  LV end diastolic pressure is mildly elevated.  Hemodynamic findings consistent with moderate pulmonary hypertension.   1. Severe 2 vessel obstructive CAD. This involves the proximal LCx and a Co dominant RCA.  2. LV filling pressures are mildly elevated 17-18 mm Hg 3. Moderate pulmonary HTN with mean PAP 39 mm Hg 4. Chronic respiratory failure with pCO2 of 67 and bicarb of 40. 5. Cardiac index 2.21.   Plan: reviewed with Dr Irish Lack. Patient presented with predominant symptoms of SOB. No significant chest pain. 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 He has significant access issues for coronary intervention. It may be prudent at this time to maximize his CHF therapy and control his arrhythmia. If he fails to respond to these measures then PCI of the RCA and LCx could be considered. If so then I would consider access from a left radial approach. If femoral access is needed then would need to use a very long access sheath to deal with iliac tortuosity.     Antimicrobials:  Anti-infectives (From admission, onward)   None     Subjective: Seen and examined bedside and he states that he is doing okay today.  Not  short of breath.  No chest pain, lightheadedness or dizziness.  Remains volume overloaded still and continues to have some leg swelling but thinks is little bit better.  No complaints or concerns at this time.  Objective: Vitals:   11/17/19 0208 11/17/19 0308 11/17/19 0500 11/17/19 0507  BP: 121/76 124/76  (!) 125/93  Pulse:      Resp:    (!) 22  Temp:    98 F (36.7 C)  TempSrc:     Oral  SpO2: 92% 92%  94%  Weight:   109.5 kg   Height:        Intake/Output Summary (Last 24 hours) at 11/17/2019 1302 Last data filed at 11/17/2019 1200 Gross per 24 hour  Intake 1756.7 ml  Output 2930 ml  Net -1173.3 ml   Filed Weights   11/15/19 0417 11/16/19 0537 11/17/19 0500  Weight: 111 kg 112.3 kg 109.5 kg   Examination: Physical Exam:  Constitutional: WN/WD obese Caucasian male currently no acute distress appears calm Eyes: Lids and conjunctivae normal, sclerae anicteric  ENMT: External Ears, Nose appear normal. Grossly normal hearing. MMM Neck: Appears normal, supple, no cervical masses, normal ROM, no appreciable thyromegaly; no JVD Respiratory: Diminished to auscultation bilaterally with some coarse breath sounds and mild crackles, no wheezing, rales, rhonchi or crackles.  Wearing supplemental oxygen via nasal cannula has unlabored breathing Cardiovascular: Irregularly irregular, has a slight murmur.  1-2+ lower extremity pitting edema Abdomen: Soft, non-tender, distended secondary body habitus. Bowel sounds positive.  GU: Deferred. Musculoskeletal: No clubbing / cyanosis of digits/nails. No joint deformity upper and lower extremities Skin: No rashes, lesions, ulcers on limited skin evaluation. No induration; Warm and dry.  Neurologic: CN 2-12 grossly intact with no focal deficits. Romberg sign and cerebellar reflexes not assessed.  Psychiatric: Normal judgment and insight. Alert and oriented x 3. Normal mood and appropriate affect.   Data Reviewed: I have personally reviewed following labs and imaging studies  CBC: Recent Labs  Lab 11/14/19 0410 11/14/19 0410 11/15/19 0443 11/16/19 0348 11/16/19 1412 11/16/19 1421 11/17/19 0425  WBC 11.1*  --  8.8 10.3  --   --  10.9*  HGB 12.6*   < > 12.2* 12.3* 13.9 13.6 12.3*  HCT 41.1   < > 40.1 40.8 41.0 40.0 41.5  MCV 98.6  --  100.5* 101.5*  --   --  101.7*  PLT 235  --  223 235  --   --  237   < > = values in this  interval not displayed.   Basic Metabolic Panel: Recent Labs  Lab 11/14/19 0410 11/14/19 0410 11/15/19 0443 11/15/19 0443 11/16/19 0348 11/16/19 1412 11/16/19 1421 11/16/19 2044 11/17/19 0425  NA 141   < > 141  --  144 143 143  --  142  K 3.8   < > 3.6   < > 3.6 3.5 3.4* 3.4* 4.5  CL 97*  --  98  --  94*  --   --   --  95*  CO2 33*  --  35*  --  40*  --   --   --  38*  GLUCOSE 92  --  97  --  105*  --   --   --  96  BUN 22  --  22  --  22  --   --   --  15  CREATININE 0.95  --  0.98  --  0.92  --   --   --  0.88  CALCIUM 8.7*  --  8.8*  --  8.5*  --   --   --  8.6*  MG  --   --   --   --  1.8  --   --  1.8 2.4   < > = values in this interval not displayed.   GFR: Estimated Creatinine Clearance: 88 mL/min (by C-G formula based on SCr of 0.88 mg/dL). Liver Function Tests: No results for input(s): AST, [name], [name], [name], [name], [name], [name], [name], [name], [name], TROPONINI in the last 168 hours. BNP (last 3 results) No results for input(s): PROBNP in the last 8760 hours. HbA1C: No results for input(s): HGBA1C in the last 72 hours. CBG: No results for input(s): GLUCAP in the last 168 hours. Lipid Profile: No results for input(s): CHOL, HDL, LDLCALC, TRIG, CHOLHDL, LDLDIRECT in the last 72 hours. Thyroid Function Tests: No results for input(s): TSH, [name], [name], [name], [name], FOLATE, FERRITIN, [name], IRON, RETICCTPCT in the last 72 hours. Sepsis Labs: No results for input(s): PROCALCITON, LATICACIDVEN in the last 168 hours.  Recent Results (from the past 240 hour(s))  Respiratory Panel by RT PCR (Flu A&B, Covid) - Nasopharyngeal  Swab     Status: None   Collection Time: 11/13/19  8:40 PM   Specimen: Nasopharyngeal Swab  Result Value Ref Range Status   SARS Coronavirus 2 by RT PCR NEGATIVE NEGATIVE Final    Comment: (NOTE) SARS-CoV-2 target nucleic acids are NOT DETECTED. The SARS-CoV-2 RNA is generally detectable in upper respiratoy specimens during the acute phase of infection. The lowest concentration of SARS-CoV-2 viral copies this assay can detect is 131 copies/mL. A negative result does not preclude SARS-Cov-2 infection and should not be used as the sole basis for treatment or other patient management decisions. A negative result may occur with  improper specimen collection/handling, submission of specimen other than nasopharyngeal swab, presence of viral mutation(s) within the areas targeted by this assay, and inadequate number of viral copies (<131 copies/mL). A negative result must be combined with clinical observations, patient history, and epidemiological information. The expected result is Negative. Fact Sheet for Patients:  PinkCheek.be Fact Sheet for Healthcare Providers:  GravelBags.it This test is not yet ap proved or cleared by the [place] FDA and  has been authorized for detection and/or diagnosis of SARS-CoV-2 by FDA under an Emergency Use Authorization (EUA). This EUA will remain  in effect (meaning this test can be used) for the duration of the COVID-19 declaration under Section 564(b)(1) of the Act, 21 U.S.C. section 360bbb-3(b)(1), unless the authorization is terminated or revoked sooner.    Influenza A by PCR NEGATIVE NEGATIVE Final   Influenza B by PCR NEGATIVE NEGATIVE Final    Comment: (NOTE) The Xpert Xpress SARS-CoV-2/FLU/RSV assay is intended as an aid in  the diagnosis of influenza from Nasopharyngeal swab specimens and  should not be used as a sole basis for treatment. Nasal washings and  aspirates are  unacceptable for Xpert Xpress SARS-CoV-2/FLU/RSV  testing. Fact Sheet for Patients: PinkCheek.be Fact Sheet for Healthcare Providers: GravelBags.it This test is not yet approved or cleared by the [place] FDA and  has been  authorized for detection and/or diagnosis of SARS-CoV-2 by  FDA under an Emergency Use Authorization (EUA). This EUA will remain  in effect (meaning this test can be used) for the duration of the  Covid-19 declaration under Section 564(b)(1) of the Act, 21  U.S.C. section 360bbb-3(b)(1), unless the authorization is  terminated or revoked. Performed at [facility], [place] [address], [place], [place] [address]      RN Pressure Injury Documentation:     Estimated body mass index is 30.19 kg/m as calculated from the following:   Height as of this encounter: _0  (1.905 m).   Weight as of this encounter: 109.5 kg.  Malnutrition Type:      Malnutrition Characteristics:      Nutrition Interventions:     Radiology Studies: CARDIAC CATHETERIZATION  Result Date: 11/16/2019  Prox Cx lesion is 95% stenosed.  Prox RCA lesion is 90% stenosed.  Mid RCA lesion is 75% stenosed.  LV end diastolic pressure is mildly elevated.  Hemodynamic findings consistent with moderate pulmonary hypertension.  1. Severe 2 vessel obstructive CAD. This involves the proximal LCx and a Co dominant RCA. 2. LV filling pressures are mildly elevated 17-18 mm Hg 3. Moderate pulmonary HTN with mean PAP 39 mm Hg 4. Chronic respiratory failure with pCO2 of 67 and bicarb of 40. 5. Cardiac index 2.21. Plan: reviewed with Dr Irish Lack. Patient presented with predominant symptoms of SOB. No significant chest pain. 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 He has significant access issues for coronary intervention. It may be prudent at this time to maximize his CHF therapy  and control his arrhythmia. If he fails to respond to these measures then PCI of the RCA and LCx could be considered. If so then I would consider access from a left radial approach. If femoral access is needed then would need to use a very long access sheath to deal with iliac tortuosity.    Scheduled Meds: . apixaban  5 mg Oral BID  . aspirin EC  81 mg Oral Daily  . atorvastatin  40 mg Oral q1800  . carvedilol  6.25 mg Oral BID WC  . dofetilide  500 mcg Oral BID  . furosemide  80 mg Intravenous BID  . sacubitril-valsartan  1 tablet Oral BID  . sodium chloride flush  3 mL Intravenous Q12H  . sodium chloride flush  3 mL Intravenous Q12H  . sodium chloride flush  3 mL Intravenous Q12H  . sodium chloride flush  3 mL Intravenous Q12H   Continuous Infusions: . sodium chloride    . sodium chloride Stopped (11/16/19 1610)  . sodium chloride 10 mL/hr at 11/16/19 1610  . sodium chloride      LOS: 4 days   Kerney Elbe, DO [facility] PAGER is on [place]  If 7PM-7AM, please contact night-coverage [email or web address]

## 2019-11-17 NOTE — Care Management (Signed)
[id number] 11-17-19 Tikosyn Load. Benefits Check submitted and will make patient aware of cost. The transition of care pharmacy does not have any samples in stock. Eliquis and Entresto and benefits check submitted. Case Manager will speak with patient regarding [place] as well. Bethena Roys, RN,BSN Case Manager [phone]

## 2019-11-17 NOTE — Progress Notes (Signed)
CARDIAC REHAB PHASE I   PRE:  Rate/Rhythm: 80 1st Deg with PVCs  BP:  Sitting: 103/69     SaO2: 90 5L  MODE:  Ambulation: 80 ft   POST:  Rate/Rhythm: 77   BP:  Sitting: 119/82    SaO2: 87 6L   Pt agreeable to ambulate after some convincing from his wife. Pt able to get himself to edge of bed. Raised bed, and pt able to stand. Pt then ambulated 16f in hallway assist of 2 with gait belt and rollator. Pt c/o fatigue and SOB upon return to room. Pt helped back into bed. Bedside table and call bell within reach. Spoke with pt some about monitoring salt intake. Will continue to follow and encourage ambulation.  [id number] Falco RN BSN 11/17/2019 2:32 PM

## 2019-11-17 NOTE — Progress Notes (Addendum)
Progress Note  Patient Name: [name] Date of Encounter: 11/17/2019  Primary Cardiologist: Berniece Salines, DO   Subjective   Feels better today, less congested, no CP  Inpatient Medications    Scheduled Meds: . apixaban  5 mg Oral BID  . aspirin EC  81 mg Oral Daily  . atorvastatin  40 mg Oral q1800  . carvedilol  6.25 mg Oral BID WC  . furosemide  80 mg Intravenous BID  . sacubitril-valsartan  1 tablet Oral BID  . sodium chloride flush  3 mL Intravenous Q12H  . sodium chloride flush  3 mL Intravenous Q12H  . sodium chloride flush  3 mL Intravenous Q12H   Continuous Infusions: . sodium chloride    . sodium chloride Stopped (11/16/19 1610)  . sodium chloride 10 mL/hr at 11/16/19 1610   PRN Meds: sodium chloride, sodium chloride, acetaminophen, fluticasone, metoprolol tartrate, pantoprazole, sodium chloride flush, sodium chloride flush   Vital Signs    Vitals:   11/17/19 0208 11/17/19 0308 11/17/19 0500 11/17/19 0507  BP: 121/76 124/76  (!) 125/93  Pulse:      Resp:    (!) 22  Temp:    98 F (36.7 C)  TempSrc:    Oral  SpO2: 92% 92%  94%  Weight:   109.5 kg   Height:        Intake/Output Summary (Last 24 hours) at 11/17/2019 0821 Last data filed at 11/17/2019 0714 Gross per 24 hour  Intake 1396.7 ml  Output 2305 ml  Net -908.3 ml   Last 3 Weights 11/17/2019 11/16/2019 11/15/2019  Weight (lbs) 241 lb 8 oz 247 lb 9.2 oz 244 lb 12.8 oz  Weight (kg) 109.544 kg 112.3 kg 111.041 kg      Telemetry    SR, 70's, 1st degree AVBlock, c/w brief SVT's though much less frequent overnight, one episode IVCD, reviewed with Dr. Caryl Comes is supraventricular, irregular. - Personally Reviewed  ECG    As yesterday - Personally Reviewed  Physical Exam   GEN: No acute distress.   Neck: No JVD Cardiac: RRR, 1-2/6 SM, no rubs, or gallops.  Respiratory: soft crackles at the bases, some breif end exp wheezes b/l. GI: Soft, nontender, non-distended  MS: 1++ edema; No  deformity. Neuro:  Nonfocal  Psych: Normal affect   R groin is stable, soft, non tender, no bleeding or hematoma R radial site stable, non tender, no bleeding or hematoma  Labs    High Sensitivity Troponin:   Recent Labs  Lab 11/13/19 1726 11/13/19 1849  TROPONINIHS 684* 571*      Chemistry Recent Labs  Lab 11/15/19 0443 11/15/19 0443 11/16/19 0348 11/16/19 0348 11/16/19 1412 11/16/19 1412 11/16/19 1421 11/16/19 2044 11/17/19 0425  NA 141   < > 144   < > 143  --  143  --  142  K 3.6   < > 3.6   < > 3.5   < > 3.4* 3.4* 4.5  CL 98  --  94*  --   --   --   --   --  95*  CO2 35*  --  40*  --   --   --   --   --  38*  GLUCOSE 97  --  105*  --   --   --   --   --  96  BUN 22  --  22  --   --   --   --   --  15  CREATININE 0.98  --  0.92  --   --   --   --   --  0.88  CALCIUM 8.8*  --  8.5*  --   --   --   --   --  8.6*  GFRNONAA >60  --  >60  --   --   --   --   --  >60  GFRAA >60  --  >60  --   --   --   --   --  >60  ANIONGAP 8  --  10  --   --   --   --   --  9   < > = values in this interval not displayed.     Hematology Recent Labs  Lab 11/15/19 0443 11/15/19 0443 11/16/19 0348 11/16/19 0348 11/16/19 1412 11/16/19 1421 11/17/19 0425  WBC 8.8  --  10.3  --   --   --  10.9*  RBC 3.99*  --  4.02*  --   --   --  4.08*  HGB 12.2*   < > 12.3*   < > 13.9 13.6 12.3*  HCT 40.1   < > 40.8   < > 41.0 40.0 41.5  MCV 100.5*  --  101.5*  --   --   --  101.7*  MCH 30.6  --  30.6  --   --   --  30.1  MCHC 30.4  --  30.1  --   --   --  29.6*  RDW 15.7*  --  15.5  --   --   --  15.7*  PLT 223  --  235  --   --   --  237   < > = values in this interval not displayed.    BNPNo results for input(s): BNP, PROBNP in the last 168 hours.   DDimer No results for input(s): DDIMER in the last 168 hours.   Radiology      Cardiac Studies    11/16/2019: LHC  Prox Cx lesion is 95% stenosed.  Prox RCA lesion is 90% stenosed.  Mid RCA lesion is 75% stenosed.  LV end  diastolic pressure is mildly elevated.  Hemodynamic findings consistent with moderate pulmonary hypertension.  1. Severe 2 vessel obstructive CAD. This involves the proximal LCx and a Co dominant RCA.  2. LV filling pressures are mildly elevated 17-18 mm Hg 3. Moderate pulmonary HTN with mean PAP 39 mm Hg 4. Chronic respiratory failure with pCO2 of 67 and bicarb of 40. 5. Cardiac index 2.21.   Plan: reviewed with Dr Irish Lack. Patient presented with predominant symptoms of SOB. No significant chest pain. 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 He has significant access issues for coronary intervention. It may be prudent at this time to maximize his CHF therapy and control his arrhythmia. If he fails to respond to these measures then PCI of the RCA and LCx could be considered. If so then I would consider access from a left radial approach. If femoral access is needed then would need to use a very long access sheath to deal with iliac tortuosity.    Echocardiogram report from [facility]: RVIDd: 3.6 cm LVOT Diam: 2.0 cm IVSd: 1.4 cm LVIDd: 5.0 cm LVPWd: 1.4 cm LVIDs: 3.9 cm EF(Teich): 43.01 % LA Diam: 4.2 cm Ao Diam: 3.3 cm LAESV MOD A4C: 86.3 ml LAESV MOD A2C: 122.7 ml LAESV  Index (A-L): 48.69 ml/m ------------ DOPPLER MV E Vel: 1.25 m/s MV A Vel: 0.00 m/s MV PHT: 33.51 ms MVA By PHT: 6.57 cm LVOT Vmax: 0.97 m/s AV Vmax: 2.75 m/s AVA Vmax: 1.10 cm AVA (VTI): 1.06 cm TR Vmax: 3.03 m/s TR maxPG: 37 mmHg RVSP: 51.65 mmHg   FINDINGS ------- Procedure:2D images, m-mode, color and spectral Doppler were obtained and reviewed. ECG rhythm:Atrial fibrillation. Study quality:This was a technically adequate study. Left Ventricle:The left ventricular size is normal. There is moderate concentric left ventricular hypertrophy. Overall left ventricular systolic function is moderate-severely impaired with, an EF between 30  - 35 %. No obvious regional wall motion abnormalities were noted. Unable to determine diastolic function dueto rhtyhm. Right Ventricle:The right ventricle is moderately enlarged. The right ventricular systolic function is moderately impaired. Left Atrium:Left atrium is moderately dilated by volume. Right Atrium:The right atrium is moderately enlarged. ASD/VSD:No evidence of interatrial communication by color flow doppler analysis. Aortic Valve:The aortic valve is trileaflet and appears structurally normal. There is mild aortic regurgitation. Mild to m oderate aortic stenosis with peak/mean pressure gradient of {30 mmhg maxPG / {18 mmhg meanPG, the aortic valve area by continuity equation is 1.1 cm square. NDI = 0.34 ( low flow ) Mitral Valve:Normal appearing mitral valve. Moderate mitral regurgitation is present. Tricuspid Valve:The tricuspid valve appears structurally normal. Moderate tricuspid regurgitation present. The right ventricular systolic pressure, as measured by Doppler, is 52 mmhg. Pulmonic Valve:The pulmonic valve is normal. Trace/mild (physiologic) pulmonic regurgitation. Aorta:The aortic root, ascending aorta and aortic arch appear normal. IVC:The inferior vena cava is dilated with no significant inspiratory collapse which is consistent estimated right atrial pressure of 15 mmHg. Pericardium:There is no pericardial effusion.  CONCLUSIONS ----------- 1. This was a technically adequate study. 2. The left ventricular size is normal. 3. There is moderate concentric left ventricular hypertrophy. 4. Overall left ventricular systolic function is moderate-severely impaired with, an EF between 30 - 35 %. 5. The right ventricle is moderately enlarged. 6. The right ventricular systolic function is moderately impaired. 7. Left atrium is moderately dilated by volume. 8. The right atrium is moderately enlarged. 9. There is mild aortic regurgitation. 10. Mild to  moderate aortic stenosis with peak/mean pressure gradient of 30 mmhg maxPG / {18 mmhg meanPG, the aortic valve area by continuity equation is 1.1 cm square. NDI = 0.34 ( low flow ) 11. Moderate mitral regurgitation is present. 12. Moderate tricuspid regurgitation present. 13. The right ventricular systolic pressure, as measured by Doppler, is 52 mmhg. 14. Trace/mild (physiologic) pulmonic regurgitation. 15. The aortic root, ascending aorta and aortic arch appear normal. 16. There is no pericardial effusion.    Patient Profile     84 y.o. male  with a hx of COPD, Pulmonary fibrosis on home O2. HTN, HFpEF, OA  initially sought medical attention at [facility] for fast HR noted at home that started when he got  Up to use the bathroom, also mentioned noting increased LE swelling over about a weeks time.  No reported CP or SOB  Initially though to be an SVT was given adenosine and IV with no reported effect, started on dilt and the rhythm slowed revealing AFlutter.  Cardiology was consulted to the case, TTE noted LVEF reduced from normal in 2020 to 30-35%, some elevation in Trop 0.8>1.09>1.56>1.80, planned to transfer to [facility] for cath and further management He was initially treated with diltiazem though switched to BB and heparin for a/c, and lasix for diuresis.  EP  was asked to the case for rhythm management strategies  Assessment & Plan     1. New onset atrial arrhythmias     AFlutter     Afib     An ATach as well  CHA2DS2Vasc is 5, started on heparin gtt this stay >> Eliquis last night He has RV pressure of 51.85mHg, O2 dependent pul;monary disease RA and LA described as moderately enlarged, LA measured 486mwith mod MR Maintaining rhythm long term mat prove difficult and will llikely require AAD.  He is maintaining SR more overnight Planned to start Tikosyn this am SR EKG yesterday reviewed with Dr. KlCaryl ComesQTc is OK for start  K+ 4.5 Mag 2.4 Creat 0.88 (Calc CrCL  using current weight is 102) Mag 2.4  We will start 50053mBID dosing    Continue with primary care team otherwise:   2. Acute CHF exacerbation 3. NICM     Cath note feels CM is out of proportion to his CAD     Getting ongoing IV diueresis with primary cardiology, goal K+ is 4.0 or better 4. CAD 5. Chronic resp failure 6. COPD 7. ILD     On home O2       For questions or updates, please contact [facility] Please consult [email or web address] for contact info under        Signed, RenBaldwin JamaicaA-C  11/17/2019, 8:21 AM   As above  QTc interval yday about 420-430 Will begin dofetilide at 500 mcg   This is signed tonight after the subsequently signed note

## 2019-11-17 NOTE — Progress Notes (Signed)
Pharmacy: Dofetilide (Tikosyn) - Initial Consult Assessment and Electrolyte Replacement  Pharmacy consulted to assist in monitoring and replacing electrolytes in this 84 y.o. male admitted on 11/13/2019 undergoing dofetilide initiation. First dofetilide dose: 500 mcg q12h  Assessment:  Patient Exclusion Criteria: If any screening criteria checked as "Yes", then  patient  should NOT receive dofetilide until criteria item is corrected.  If "Yes" please indicate correction plan.  YES  NO Patient  Exclusion Criteria Correction Plan   _0   _1   Baseline QTc interval is greater than or equal to 440 msec. IF above YES box checked dofetilide contraindicated unless patient has ICD; then may proceed if QTc 500-550 msec or with known ventricular conduction abnormalities may proceed with QTc 550-600 msec. QTc = 0.47 (QTc 413 with Frederica equation per EP)    _2   _3   Patient is known or suspected to have a digoxin level greater than 2 ng/ml: No results found for: DIGOXIN     _4   _5   Creatinine clearance less than 20 ml/min (calculated using Cockcroft-Gault, actual body weight and serum creatinine): Estimated Creatinine Clearance: 88 mL/min (by C-G formula based on SCr of 0.88 mg/dL).     _6   _7  Patient has received drugs known to prolong the QT intervals within the last 48 hours (phenothiazines, tricyclics or tetracyclic antidepressants, erythromycin, H-1 antihistamines, cisapride, fluoroquinolones, azithromycin). Updated information on QT prolonging agents is available to be searched on the following database:QT prolonging agents     _8   _9   Patient received a dose of hydrochlorothiazide (Oretic) alone or in any combination including triamterene (Dyazide, Maxzide) in the last 48 hours.    _10   _11  Patient received a medication known to increase dofetilide plasma concentrations prior to initial dofetilide dose:  . Trimethoprim (Primsol, Proloprim) in the last 36 hours . Verapamil (Calan,  Verelan) in the last 36 hours or a sustained release dose in the last 72 hours . Megestrol (Megace) in the last 5 days  . Cimetidine (Tagamet) in the last 6 hours . Ketoconazole (Nizoral) in the last 24 hours . Itraconazole (Sporanox) in the last 48 hours  . Prochlorperazine (Compazine) in the last 36 hours     _12   _13   Patient is known to have a history of torsades de pointes; congenital or acquired long QT syndromes.    _14   _15   Patient has received a Class 1 antiarrhythmic with less than 2 half-lives since last dose. (Disopyramide, Quinidine, Procainamide, Lidocaine, Mexiletine, Flecainide, Propafenone)    _16   _17   Patient has received amiodarone therapy in the past 3 months or amiodarone level is greater than 0.3 ng/ml.    Patient has been appropriately anticoagulated with heparin to apixaban.  Labs:    Component Value Date/Time   K 4.5 11/17/2019 0425   MG 2.4 11/17/2019 0425     Plan: Potassium: K >/= 4: Appropriate to initiate Tikosyn, no replacement needed    Magnesium: Mg >2: Appropriate to initiate Tikosyn, no replacement needed     Thank you for allowing pharmacy to participate in this patient's care   Arrie Senate, PharmD, BCPS Clinical Pharmacist [phone] Please check AMION for all [place] numbers 11/17/2019

## 2019-11-17 NOTE — Progress Notes (Signed)
PT Cancellation Note  Patient Details Name: [name] MRN: [id number] DOB: August 28, 1936   Cancelled Treatment:    Reason Eval/Treat Not Completed: Other (comment); patient reports orders to not push with R UE until after 3pm today due to cath yesterday.  Discussed option of PT helping more with mobility so he can keep R hand from pushing, or waiting till after 3:00.  Patient preferred to wait, but encouraged to at least sit EOB for meals and move legs in the bed.  He agreed.  Will attempt again later day.    Reginia Naas 11/17/2019, 9:49 AM  [place] [phone] 11/17/2019

## 2019-11-17 NOTE — Progress Notes (Addendum)
Progress Note  Patient Name: [name] Date of Encounter: 11/17/2019  Primary Cardiologist: Berniece Salines, DO   Subjective   Pt thinks his breathing is improving. No chest pain.  Inpatient Medications    Scheduled Meds: . apixaban  5 mg Oral BID  . aspirin EC  81 mg Oral Daily  . atorvastatin  40 mg Oral q1800  . carvedilol  6.25 mg Oral BID WC  . furosemide  80 mg Intravenous BID  . sacubitril-valsartan  1 tablet Oral BID  . sodium chloride flush  3 mL Intravenous Q12H  . sodium chloride flush  3 mL Intravenous Q12H  . sodium chloride flush  3 mL Intravenous Q12H   Continuous Infusions: . sodium chloride    . sodium chloride Stopped (11/16/19 1610)  . sodium chloride 10 mL/hr at 11/16/19 1610   PRN Meds: sodium chloride, sodium chloride, acetaminophen, fluticasone, metoprolol tartrate, pantoprazole, sodium chloride flush, sodium chloride flush   Vital Signs    Vitals:   11/17/19 0208 11/17/19 0308 11/17/19 0500 11/17/19 0507  BP: 121/76 124/76  (!) 125/93  Pulse:      Resp:    (!) 22  Temp:    98 F (36.7 C)  TempSrc:    Oral  SpO2: 92% 92%  94%  Weight:   109.5 kg   Height:        Intake/Output Summary (Last 24 hours) at 11/17/2019 0804 Last data filed at 11/17/2019 0714 Gross per 24 hour  Intake 1396.7 ml  Output 2455 ml  Net -1058.3 ml   Last 3 Weights 11/17/2019 11/16/2019 11/15/2019  Weight (lbs) 241 lb 8 oz 247 lb 9.2 oz 244 lb 12.8 oz  Weight (kg) 109.544 kg 112.3 kg 111.041 kg      Telemetry    Sinus rhythm with bouts of tachyarrhythmia appears atrial flutter with rates in the 100-120s - Personally Reviewed  ECG    No new tracings - Personally Reviewed  Physical Exam   GEN: No acute distress.   Neck: + JVD Cardiac: RRR, no murmurs, rubs, or gallops.  Respiratory: respirations unlabored, course sounds throughout GI: Soft, nontender, non-distended  MS: at least 1+ B LE edema; No deformity. Neuro:  Nonfocal  Psych: Normal affect  Both  cath sites look C/D/I - no groin hematoma appreciated  Labs    High Sensitivity Troponin:   Recent Labs  Lab 11/13/19 1726 11/13/19 1849  TROPONINIHS 684* 571*      Chemistry Recent Labs  Lab 11/15/19 0443 11/15/19 0443 11/16/19 0348 11/16/19 0348 11/16/19 1412 11/16/19 1412 11/16/19 1421 11/16/19 2044 11/17/19 0425  NA 141   < > 144   < > 143  --  143  --  142  K 3.6   < > 3.6   < > 3.5   < > 3.4* 3.4* 4.5  CL 98  --  94*  --   --   --   --   --  95*  CO2 35*  --  40*  --   --   --   --   --  38*  GLUCOSE 97  --  105*  --   --   --   --   --  96  BUN 22  --  22  --   --   --   --   --  15  CREATININE 0.98  --  0.92  --   --   --   --   --  0.88  CALCIUM 8.8*  --  8.5*  --   --   --   --   --  8.6*  GFRNONAA >60  --  >60  --   --   --   --   --  >60  GFRAA >60  --  >60  --   --   --   --   --  >60  ANIONGAP 8  --  10  --   --   --   --   --  9   < > = values in this interval not displayed.     Hematology Recent Labs  Lab 11/15/19 0443 11/15/19 0443 11/16/19 0348 11/16/19 0348 11/16/19 1412 11/16/19 1421 11/17/19 0425  WBC 8.8  --  10.3  --   --   --  10.9*  RBC 3.99*  --  4.02*  --   --   --  4.08*  HGB 12.2*   < > 12.3*   < > 13.9 13.6 12.3*  HCT 40.1   < > 40.8   < > 41.0 40.0 41.5  MCV 100.5*  --  101.5*  --   --   --  101.7*  MCH 30.6  --  30.6  --   --   --  30.1  MCHC 30.4  --  30.1  --   --   --  29.6*  RDW 15.7*  --  15.5  --   --   --  15.7*  PLT 223  --  235  --   --   --  237   < > = values in this interval not displayed.    BNPNo results for input(s): BNP, PROBNP in the last 168 hours.   DDimer No results for input(s): DDIMER in the last 168 hours.   Radiology    CARDIAC CATHETERIZATION  Result Date: 11/16/2019  Prox Cx lesion is 95% stenosed.  Prox RCA lesion is 90% stenosed.  Mid RCA lesion is 75% stenosed.  LV end diastolic pressure is mildly elevated.  Hemodynamic findings consistent with moderate pulmonary hypertension.  1.  Severe 2 vessel obstructive CAD. This involves the proximal LCx and a Co dominant RCA. 2. LV filling pressures are mildly elevated 17-18 mm Hg 3. Moderate pulmonary HTN with mean PAP 39 mm Hg 4. Chronic respiratory failure with pCO2 of 67 and bicarb of 40. 5. Cardiac index 2.21. Plan: reviewed with Dr Irish Lack. Patient presented with predominant symptoms of SOB. No significant chest pain. 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 He has significant access issues for coronary intervention. It may be prudent at this time to maximize his CHF therapy and control his arrhythmia. If he fails to respond to these measures then PCI of the RCA and LCx could be considered. If so then I would consider access from a left radial approach. If femoral access is needed then would need to use a very long access sheath to deal with iliac tortuosity.    Cardiac Studies   Right and left heart cath 11/16/19:  Prox Cx lesion is 95% stenosed.  Prox RCA lesion is 90% stenosed.  Mid RCA lesion is 75% stenosed.  LV end diastolic pressure is mildly elevated.  Hemodynamic findings consistent with moderate pulmonary hypertension.   1. Severe 2 vessel obstructive CAD. This involves the proximal LCx and a Co dominant RCA.  2. LV filling pressures are mildly elevated 17-18  mm Hg 3. Moderate pulmonary HTN with mean PAP 39 mm Hg 4. Chronic respiratory failure with pCO2 of 67 and bicarb of 40. 5. Cardiac index 2.21.    Plan: reviewed with Dr Irish Lack. Patient presented with predominant symptoms of SOB. No significant chest pain. 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 He has significant access issues for coronary intervention. It may be prudent at this time to maximize his CHF therapy and control his arrhythmia. If he fails to respond to these measures then PCI of the RCA and LCx could be considered. If so  then I would consider access from a left radial approach. If femoral access is needed then would need to use a very long access sheath to deal with iliac tortuosity.   Patient Profile     84 y.o. male with a history of obesity, chronic diastolic heart failure, ILD on home O2 of 4L, and hypertension. He presented to [place] with lower extremity edema found to be in Afib/flutter RVR with elevated troponin. He was transferred to [facility] for further management.   Assessment & Plan    1. NSTEMI - troponin 0.8 --> 1.09 --> 1.56 --> 1.80 - worsening LV function on echo at [facility] - right and left heart cath yesterday with 90% proximal RCA and 95% in proximal Cx - given access issues and lack of chest pain as his presenting symptom - intervention was deferred - plan for CHF medical therapy as well as rate and rhythm control - continue ASA and statin, BB - groin looks good, but has not walked yet   2. Atrial flutter with RVR - cardizem gtt was initially started, then switched to BB given his EF of 30-35% on echo at [facility] - previously normal in 03/2019 - we have asked EP to consult for AED therapy with tikosyn - plan to start today if electrolytes within range - eliquis started yesterday - This patients CHA2DS2-VASc Score and unadjusted Ischemic Stroke Rate (% per year) is equal to 7.2 % stroke rate/year from a score of 5 (2age, CHF, CAD, HTN)   3. 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 Hypertension - medications as above   5. Hyperlipidemia with LDL goal < 70 - 11/13/2019: Cholesterol 124; HDL 37; LDL Cholesterol 76;  Triglycerides 54; VLDL 11 - continue statin - 40 mg lipitor   6. Obesity - encouraged weight loss      For questions or updates, please contact [place] Please consult [email or web address] for contact info under        Signed, Ledora Bottcher, PA  11/17/2019, 8:04 AM    I have examined the patient and reviewed assessment and plan and discussed with patient.  Agree with above as stated.    Coronary artery disease noted on recent catheterization.  It was not to the degree that would cause his significant LV dysfunction.  This was more likely caused by persistent tachycardia. No angina.  Initiating Tikosyn today for rhythm control.  Appreciate electrophysiology input.  Right radial  cath site is without hematoma.  Right groin cath site is without hematoma.  If intervention is considered in the future, would have to consider left radial site.  He had severe tortuosity in the right radial and right iliac.  Larae Grooms

## 2019-11-17 NOTE — Progress Notes (Signed)
Pt.had 35 beats run of SVT ,pt.was asymptomatic;MD on call made aware.Will continue to monitor pt.

## 2019-11-17 NOTE — Progress Notes (Signed)
Occupational Therapy Treatment Patient Details Name: [name] MRN: [id number] DOB: 10/31/1935 Today's Date: 11/17/2019    History of present illness Pt is a 84 y.o. M with significant PMH of chronic HFpEF, 4L O2 dependent pulmonary fibrosis, and HTN who was admitted with ventricular rate in 160s and new onset atrial flutter with RVR. ECG showed newly depressed LV systolic function LVEF 94-49%.    OT comments  Patient continues to make steady progress towards goals in skilled OT session. Patient's session encompassed education in regards to adaptive equipment, rollator, and energy conservation techniques. Pt up in chair upon therapist's arrival, and had just completed a bath. Pt introduced to reacher, sock aid, long handled sponge, and long handled shoe horn to aid in overall independence. Pt able to trial AE appropriately, requiring cues for sequencing. Patient able to verbalize 2/3 strategies for energy conservation and reducing the risk of falls when completing functional tasks and ambulation. Pt would benefit from reinforcement of AE as well as energy conservation strategies (handout provided at the end of session).  Will continue to follow acutely.    Follow Up Recommendations  Home health OT;Supervision/Assistance - 24 hour    Equipment Recommendations  Other (comment)(Rolling walker/adaptive equipment)    Recommendations for Other Services      Precautions / Restrictions Precautions Precautions: Fall;Other (comment) Precaution Comments: 4L O2 baseline Restrictions Weight Bearing Restrictions: No       Mobility Bed Mobility                  Transfers                 General transfer comment: transfers not attempted to date due to R radial partial WB due to procedure yesterday, pt up in chair upon therapist's arrival    Balance                                           ADL either performed or assessed with clinical judgement   ADL  Overall ADL's : Needs assistance/impaired             Lower Body Bathing: Minimal assistance;With adaptive equipment;Cueing for sequencing;Sitting/lateral leans Lower Body Bathing Details (indicate cue type and reason): Introduced sock aid, reacher, and long handled sponge in session     Lower Body Dressing: Minimal assistance;With adaptive equipment;Cueing for sequencing;Sitting/lateral leans Lower Body Dressing Details (indicate cue type and reason): Introduced sock aid, reacher, and long handled sponge in session             Functional mobility during ADLs: Minimal assistance;Rolling walker;Cueing for safety General ADL Comments: Introduced AE and rollator in session, left for PT to attempt later in PM due to R radial restrictions from procedure yesterday     Vision       Perception     Praxis      Cognition Arousal/Alertness: Awake/alert Behavior During Therapy: WFL for tasks assessed/performed Overall Cognitive Status: Within Functional Limits for tasks assessed                                          Exercises     Shoulder Instructions       General Comments      Pertinent Vitals/ Pain  Pain Assessment: No/denies pain  Home Living                                          Prior Functioning/Environment              Frequency  Min 3X/week        Progress Toward Goals  OT Goals(current goals can now be found in the care plan section)  Progress towards OT goals: Progressing toward goals  Acute Rehab OT Goals Patient Stated Goal: to do as much as he can OT Goal Formulation: With patient Time For Goal Achievement: 11/29/19 Potential to Achieve Goals: Good  Plan Discharge plan remains appropriate    Co-evaluation                 AM-PAC OT "6 Clicks" Daily Activity     Outcome Measure   Help from another person eating meals?: None Help from another person taking care of personal grooming?:  A Little Help from another person toileting, which includes using toliet, bedpan, or urinal?: A Little Help from another person bathing (including washing, rinsing, drying)?: A Little Help from another person to put on and taking off regular upper body clothing?: A Little Help from another person to put on and taking off regular lower body clothing?: A Little 6 Click Score: 19    End of Session Equipment Utilized During Treatment: Other (comment);Oxygen(Introduced rollator in session, PT to assess later this afternoon)  OT Visit Diagnosis: Unsteadiness on feet (R26.81);Muscle weakness (generalized) (M62.81)   Activity Tolerance Patient tolerated treatment well   Patient Left in chair;with call bell/phone within reach;with family/visitor present   Nurse Communication          Time: 0712-1975 OT Time Calculation (min): 23 min  Charges: OT General Charges $OT Visit: 1 Visit OT Treatments $Self Care/Home Management : 23-37 mins  Gillis. Ranya Fiddler, COTA/L Acute Rehabilitation Services [place] 11/17/2019, 12:17 PM

## 2019-11-17 NOTE — Care Management (Signed)
Memory Argue sent to Nestor Lewandowsky, RN  # 1.  Jamelle Haring KATHY @ [facility] # [phone]    APIXABAN : NON-FORMULARY   1. ELIQUIS  5 MG BID  and ELIQUIS  2.5 MG BID  COVER- YES  CO-PAY- $ 8.00  Q/L TWO PILL PER DAY  TIER- 2 DRUG  PRIOR APPROVAL- NO  90 DAY SUPPLY FOR M/O $16.00    2. TIKOSYN 500 MCG BID CAPSULE  and DOFETILIDE 500 MCG BID  COVER- YES  CO-PAY- $100.00  TIER- 4 DRUG  PRIOR APPROVAL-NO  90 DAY SUPPLY FOR M/O $290.00    3. ENTRESTO 24-26 MG TABLET BID  and   SACUBITRIL-VALSARTAN 500 MG TABLET  COVER- YES  CO-PAY- $8.00  TIER- 3 DRUG  PRIOR APPROVAL- NO  90 DAY SUPPLY FOR M/O $16.00   DEDUCTIBLE NOT MET  OUT-OF-POCKET: NOT MET   PREFERRED PHARMACY : YES  [facility]  [facility] M/O

## 2019-11-17 NOTE — Progress Notes (Addendum)
Post dose EKG is reviewed.  SR 72, 1st degree AVblock, PAC Measured QT 545m, QTc by Bazett is 570, Friderica is 5563mTelemetry reviewed, SR, brief infrequent A bigeminy  I will stop dofetilide tonight Check EKG in the Morning, consider resuming at 25080m I will make Dr. KleCaryl [place] Discussed with the patient  RenTommye StandardA-C  Agree with above   The QT prolongation is so striking that I would wonder whether Mr Lumadue's repolarization reserve is just too limited to tolerate a class 3 drug That leaves our options as amiodarone or rate control  Will discuss w colleagues

## 2019-11-18 ENCOUNTER — Inpatient Hospital Stay (HOSPITAL_COMMUNITY): Payer: Medicare Other

## 2019-11-18 ENCOUNTER — Telehealth: Payer: Self-pay | Admitting: *Deleted

## 2019-11-18 ENCOUNTER — Other Ambulatory Visit: Payer: Self-pay | Admitting: Physician Assistant

## 2019-11-18 DIAGNOSIS — I484 Atypical atrial flutter: Secondary | ICD-10-CM

## 2019-11-18 LAB — CBC WITH DIFFERENTIAL/PLATELET
Abs Immature Granulocytes: 0.04 10*3/uL (ref 0.00–0.07)
Basophils Absolute: 0 10*3/uL (ref 0.0–0.1)
Basophils Relative: 0 %
Eosinophils Absolute: 0.4 10*3/uL (ref 0.0–0.5)
Eosinophils Relative: 3 %
HCT: 40.6 % (ref 39.0–52.0)
Hemoglobin: 12.3 g/dL — ABNORMAL LOW (ref 13.0–17.0)
Immature Granulocytes: 0 %
Lymphocytes Relative: 9 %
Lymphs Abs: 1 10*3/uL (ref 0.7–4.0)
MCH: 30.7 pg (ref 26.0–34.0)
MCHC: 30.3 g/dL (ref 30.0–36.0)
MCV: 101.2 fL — ABNORMAL HIGH (ref 80.0–100.0)
Monocytes Absolute: 1 10*3/uL (ref 0.1–1.0)
Monocytes Relative: 9 %
Neutro Abs: 8.5 10*3/uL — ABNORMAL HIGH (ref 1.7–7.7)
Neutrophils Relative %: 79 %
Platelets: 213 10*3/uL (ref 150–400)
RBC: 4.01 MIL/uL — ABNORMAL LOW (ref 4.22–5.81)
RDW: 15.7 % — ABNORMAL HIGH (ref 11.5–15.5)
WBC: 10.9 10*3/uL — ABNORMAL HIGH (ref 4.0–10.5)
nRBC: 0 % (ref 0.0–0.2)

## 2019-11-18 LAB — COMPREHENSIVE METABOLIC PANEL
ALT: 14 U/L (ref 0–44)
AST: 16 U/L (ref 15–41)
Albumin: 2.6 g/dL — ABNORMAL LOW (ref 3.5–5.0)
Alkaline Phosphatase: 53 U/L (ref 38–126)
Anion gap: 8 (ref 5–15)
BUN: 15 mg/dL (ref 8–23)
CO2: 38 mmol/L — ABNORMAL HIGH (ref 22–32)
Calcium: 8.6 mg/dL — ABNORMAL LOW (ref 8.9–10.3)
Chloride: 96 mmol/L — ABNORMAL LOW (ref 98–111)
Creatinine, Ser: 0.87 mg/dL (ref 0.61–1.24)
GFR calc Af Amer: >60 mL/min (ref >60)
GFR calc non Af Amer: >60 mL/min (ref >60)
Glucose, Bld: 95 mg/dL (ref 70–99)
Potassium: 4.6 mmol/L (ref 3.5–5.1)
Sodium: 142 mmol/L (ref 135–145)
Total Bilirubin: 0.9 mg/dL (ref 0.3–1.2)
Total Protein: 6.7 g/dL (ref 6.5–8.1)

## 2019-11-18 LAB — RETICULOCYTES
Immature Retic Fract: 12.7 % (ref 2.3–15.9)
RBC.: 4.02 MIL/uL — ABNORMAL LOW (ref 4.22–5.81)
Retic Count, Absolute: 58.3 10*3/uL (ref 19.0–186.0)
Retic Ct Pct: 1.5 % (ref 0.4–3.1)

## 2019-11-18 LAB — VITAMIN B12: Vitamin B-12: 288 pg/mL (ref 180–914)

## 2019-11-18 LAB — IRON AND TIBC
Iron: 31 ug/dL — ABNORMAL LOW (ref 45–182)
Saturation Ratios: 13 % — ABNORMAL LOW (ref 17.9–39.5)
TIBC: 232 ug/dL — ABNORMAL LOW (ref 250–450)
UIBC: 201 ug/dL

## 2019-11-18 LAB — PHOSPHORUS: Phosphorus: 3.4 mg/dL (ref 2.5–4.6)

## 2019-11-18 LAB — FOLATE: Folate: 7.8 ng/mL (ref >5.9)

## 2019-11-18 LAB — FERRITIN: Ferritin: 153 ng/mL (ref 24–336)

## 2019-11-18 LAB — MAGNESIUM: Magnesium: 2.1 mg/dL (ref 1.7–2.4)

## 2019-11-18 NOTE — Plan of Care (Signed)

## 2019-11-18 NOTE — Progress Notes (Signed)
Physical Therapy Treatment Patient Details Name: [name] MRN: [id number] DOB: 11/09/1935 Today's Date: 11/18/2019    History of Present Illness Pt is a 83 y.o. M with significant PMH of chronic HFpEF, 4L O2 dependent pulmonary fibrosis, and HTN who was admitted with ventricular rate in 160s and new onset atrial flutter with RVR. ECG showed newly depressed LV systolic function LVEF 30-35%.     PT Comments    Patient received in bed, wife present and observed session as well. Agreeable to ambulation today, feeling better in general but continuing to complain of L ankle pain. See below for mobility/assist levels. Tolerated progression of gait distance well this morning with 4WW, no rest breaks, HR 98-124BPM and SPO2 90-92% on 4LPM O2. Did have some intermittent desats in the room down to 81%, able to recover back to 90% with time and pursed lip breathing. He was left up in the chair with all needs met, wife present. Progressing well.     Follow Up Recommendations  Home health PT;Supervision for mobility/OOB     Equipment Recommendations  None recommended by PT    Recommendations for Other Services       Precautions / Restrictions Precautions Precautions: Fall Precaution Comments: 4L O2 baseline Restrictions Weight Bearing Restrictions: No    Mobility  Bed Mobility Overal bed mobility: Modified Independent             General bed mobility comments: HOB elevated, increased time and effort  Transfers Overall transfer level: Needs assistance Equipment used: 4-wheeled walker Transfers: Sit to/from Stand Sit to Stand: Mod assist         General transfer comment: ModA from standard height surface, rocking and momentum used  Ambulation/Gait Ambulation/Gait assistance: Min guard Gait Distance (Feet): 200 Feet Assistive device: 4-wheeled walker Gait Pattern/deviations: Step-through pattern;Decreased stride length;Trunk flexed Gait velocity: decreased   General Gait  Details: on 4LPM as per baseline, HR in A-fib 93-124BPM, O2 90-92% with gait; slow and steady with 4WW. Able to walk in room with no UE support and min guard.   Stairs             Wheelchair Mobility    Modified Rankin (Stroke Patients Only)       Balance Overall balance assessment: Needs assistance   Sitting balance-Leahy Scale: Good     Standing balance support: Bilateral upper extremity supported Standing balance-Leahy Scale: Fair Standing balance comment: relies on UE support for balance                            Cognition Arousal/Alertness: Awake/alert Behavior During Therapy: WFL for tasks assessed/performed Overall Cognitive Status: Within Functional Limits for tasks assessed                                        Exercises      General Comments General comments (skin integrity, edema, etc.): O2 90-92% on 4LPM with gait, did have intermittent drops to 81% in room but able to recover with pursed lip breathing on 4LPM per [place]      Pertinent Vitals/Pain Pain Assessment: Faces Faces Pain Scale: Hurts a little bit Pain Location: L foot Pain Descriptors / Indicators: Discomfort;Sore Pain Intervention(s): Limited activity within patient's tolerance;Monitored during session    Home Living                        Prior Function            PT Goals (current goals can now be found in the care plan section) Acute Rehab PT Goals Patient Stated Goal: to do as much as he can PT Goal Formulation: With patient Time For Goal Achievement: 11/29/19 Potential to Achieve Goals: Good Progress towards PT goals: Progressing toward goals    Frequency    Min 3X/week      PT Plan Current plan remains appropriate    Co-evaluation              AM-PAC PT "6 Clicks" Mobility   Outcome Measure  Help needed turning from your back to your side while in a flat bed without using bedrails?: None Help needed moving from lying on  your back to sitting on the side of a flat bed without using bedrails?: A Little Help needed moving to and from a bed to a chair (including a wheelchair)?: A Little Help needed standing up from a chair using your arms (e.g., wheelchair or bedside chair)?: A Little Help needed to walk in hospital room?: A Little Help needed climbing 3-5 steps with a railing? : A Little 6 Click Score: 19    End of Session Equipment Utilized During Treatment: Oxygen;Gait belt Activity Tolerance: Patient tolerated treatment well Patient left: in chair;with call bell/phone within reach;with family/visitor present Nurse Communication: Mobility status;Other (comment)(sats/HR during gait) PT Visit Diagnosis: Unsteadiness on feet (R26.81);Difficulty in walking, not elsewhere classified (R26.2)     Time: 1028-1056 PT Time Calculation (min) (ACUTE ONLY): 28 min  Charges:  $Gait Training: 8-22 mins $Therapeutic Activity: 8-22 mins                     Kristen U PT, DPT, PN1   Supplemental Physical Therapist [place]    Pager [phone] Acute Rehab Office [phone]    

## 2019-11-18 NOTE — TOC Initial Note (Signed)
Transition of Care [facility]) - Initial/Assessment Note    Patient Details  Name: [name] MRN: [id number] Date of Birth: [birth year]  [facility]) CM/SW Contact:    Curlene Labrum, RN Phone Number: 11/18/2019, 4:03 PM  Clinical Narrative:                  Patient was admitted to [facility] 5 days ago with NSTEMI (non-ST elevated myocardia infarction).  Patient is in observance with his wife in the room with Case Management in attendance.  Patient given Medicare Choice list in offering home health.  Patient would like to have [facility] deliver services for PT/OT to the home but prefer to not have a visiting RN to the home at this time. If the patient needs medication or health management education they will ask the [facility] send an RN later.  Patient currently has [facility] for their O2 needs at the home and will continue to use their services upon discharge.  Patient states that they use [facility] in [place], [place] to fill medications for convenience and [facility] second.  Physical therapy has been working with the patient regarding using a specialty walker with a seat but the patient prefers to have  a rolling walker delivered to the room before discharge for ambulation needs.  No other Case Management needs are identified at this time.  [place] called and spoke with Erline Levine who accepted the patient as a [place] patient for PT/OT.       Patient Goals and CMS Choice        Expected Discharge Plan and Services                                                Prior Living Arrangements/Services                       Activities of Daily Living Home Assistive Devices/Equipment: Cane (specify quad or straight) ADL Screening (condition at time of admission) Patient's cognitive ability adequate to safely complete daily activities?: Yes Is the patient deaf or have difficulty hearing?: Yes Does the  patient have difficulty seeing, even when wearing glasses/contacts?: No Does the patient have difficulty concentrating, remembering, or making decisions?: No Patient able to express need for assistance with ADLs?: Yes Does the patient have difficulty dressing or bathing?: No Independently performs ADLs?: Yes (appropriate for developmental age) Does the patient have difficulty walking or climbing stairs?: Yes Weakness of Legs: Both Weakness of Arms/Hands: None  Permission Sought/Granted                  Emotional Assessment              Admission diagnosis:  NSTEMI (non-ST elevated myocardial infarction) [facility]) [I21.4] Patient Active Problem List   Diagnosis Date Noted  . Acute on chronic combined systolic and diastolic CHF (congestive heart failure) ([place])   . NSTEMI (non-ST elevated myocardial infarction) ([place])   . Atrial fibrillation with RVR ([place])   . Chronic respiratory failure with hypoxia ([place])   . Dyspnea 09/12/2016  . Respiratory failure ([place]) 06/13/2016  . GERD (gastroesophageal reflux disease) 06/13/2016  . Restrictive airway disease 06/13/2016  . Emphysema of lung ([place]) 04/26/2016  . H/O asbestos exposure 04/26/2016  . ILD (interstitial lung disease) (  [place]) 04/26/2016  . Essential hypertension 04/26/2016  . Aortic aneurysm without rupture ([place]) 04/26/2016  . Osteoarthritis 04/26/2016  . Benign prostatic hyperplasia 04/26/2016  . Chronic allergic rhinitis 04/26/2016  . Heart murmur 04/26/2016   PCP:  [name], MD Pharmacy:   [facility] [place], [address] [place] [address] Phone: [phone] Fax: [phone]  [place], [place] [facility] [address] [place] [address] [place] [address] Phone: [phone] Fax: [phone]  [facility] [place], [place] - [address] [place] [address] Phone: [phone] Fax: [phone]     Social Determinants of Health (SDOH) Interventions    Readmission Risk Interventions No flowsheet data found.

## 2019-11-18 NOTE — Progress Notes (Addendum)
Progress Note  Patient Name: [name] Date of Encounter: 11/18/2019  Primary Cardiologist: Godfrey Pick Tobb, DO   Subjective   No acute overnight events. Patient states that overall he feels better since admission. Breathing OK. No chest pain. No palpitations, lightheadedness, or dizziness.   Inpatient Medications    Scheduled Meds: . apixaban  5 mg Oral BID  . aspirin EC  81 mg Oral Daily  . atorvastatin  40 mg Oral q1800  . carvedilol  6.25 mg Oral BID WC  . furosemide  80 mg Intravenous BID  . sacubitril-valsartan  1 tablet Oral BID  . sodium chloride flush  3 mL Intravenous Q12H  . sodium chloride flush  3 mL Intravenous Q12H  . sodium chloride flush  3 mL Intravenous Q12H  . sodium chloride flush  3 mL Intravenous Q12H   Continuous Infusions: . sodium chloride    . sodium chloride Stopped (11/16/19 1610)  . sodium chloride 10 mL/hr at 11/16/19 1610  . sodium chloride     PRN Meds: sodium chloride, sodium chloride, sodium chloride, acetaminophen, fluticasone, metoprolol tartrate, pantoprazole, sodium chloride flush, sodium chloride flush, sodium chloride flush   Vital Signs    Vitals:   11/17/19 1800 11/17/19 1949 11/18/19 0300 11/18/19 0517  BP: 130/74 102/72  120/75  Pulse:  72  60  Resp:      Temp:  (!) 97.3 F (36.3 C)  98.1 F (36.7 C)  TempSrc:  Oral  Oral  SpO2: (!) 88% (!) 89% 96% 95%  Weight:    111.9 kg  Height:        Intake/Output Summary (Last 24 hours) at 11/18/2019 0723 Last data filed at 11/18/2019 0500 Gross per 24 hour  Intake 1389.17 ml  Output 1700 ml  Net -310.83 ml   Last 3 Weights 11/18/2019 11/17/2019 11/16/2019  Weight (lbs) 246 lb 11.1 oz 241 lb 8 oz 247 lb 9.2 oz  Weight (kg) 111.9 kg 109.544 kg 112.3 kg      Telemetry    Sinus rhythm with baseline rates in the 60's to 70's with several brief episodes of sinus tachycardia/atrial tachycardia with rates in the 110's. - Personally Reviewed  ECG    Sinus tachycardia, rate  116 bpm, with 1st degree AV block (very prolonged PR), elevated J point in III and aVR, and ST depression in I, II, aVL, and V3-V6. QTc 456 ms.  - Personally Reviewed  Physical Exam   GEN: Elderly Caucasian male in no acute distress.   Neck: Supple. Possible elevated JVD. Cardiac: RRR. II-III systolic murmur best heard at upper sternal border. No rubs or gallops. Right radial ,brachial, and femoral cath sites soft with some mild bruising but no signs of hematoma.  Respiratory: No increased worked of breathing. Crackles noted in bibasilar bases. No wheezes or rhonchi. GI: Soft, non-distended, and non-tender. Bowel sounds present. MS:1+ pitting edema of bilateral lower extremities. Edema of right upper extremity also noted but patient states this arm is always a little more swollen than the left since he pulled a tendon/ligament. No deformity. Skin: Warm and dry. Neuro:  No focal deficits.  Psych: Normal affect. Responds appropriately.   Labs    High Sensitivity Troponin:   Recent Labs  Lab 11/13/19 1726 11/13/19 1849  TROPONINIHS 684* 571*      Chemistry Recent Labs  Lab 11/16/19 0348 11/16/19 1412 11/16/19 1421 11/16/19 1421 11/16/19 2044 11/17/19 0425 11/18/19 0408  NA 144   < > 143  --   --  142 142  K 3.6   < > 3.4*   < > 3.4* 4.5 4.6  CL 94*  --   --   --   --  95* 96*  CO2 40*  --   --   --   --  38* 38*  GLUCOSE 105*  --   --   --   --  96 95  BUN 22  --   --   --   --  15 15  CREATININE 0.92  --   --   --   --  0.88 0.87  CALCIUM 8.5*  --   --   --   --  8.6* 8.6*  [name]  --   --   --   --   --   --  6.7  ALBUMIN  --   --   --   --   --   --  2.6*  AST  --   --   --   --   --   --  16  [name]  --   --   --   --   --   --  14  [name]  --   --   --   --   --   --  53  [name]  --   --   --   --   --   --  0.9  GFRNONAA >60  --   --   --   --  >60 >60  GFRAA >60  --   --   --   --  >60 >60  ANIONGAP 10  --   --   --   --  9 8   < > = values in this interval not  displayed.     Hematology Recent Labs  Lab 11/16/19 0348 11/16/19 0348 11/16/19 1412 11/16/19 1421 11/17/19 0425 11/18/19 0408  WBC 10.3  --   --   --  10.9* 10.9*  RBC 4.02*   < >  --   --  4.08* 4.01*  4.02*  HGB 12.3*  --    < > 13.6 12.3* 12.3*  HCT 40.8  --    < > 40.0 41.5 40.6  MCV 101.5*  --   --   --  101.7* 101.2*  MCH 30.6  --   --   --  30.1 30.7  MCHC 30.1  --   --   --  29.6* 30.3  RDW 15.5  --   --   --  15.7* 15.7*  PLT 235  --   --   --  237 213   < > = values in this interval not displayed.    BNPNo results for input(s): BNP, PROBNP in the last 168 hours.   DDimer No results for input(s): DDIMER in the last 168 hours.   Radiology    CARDIAC CATHETERIZATION  Result Date: 11/16/2019  Prox Cx lesion is 95% stenosed.  Prox RCA lesion is 90% stenosed.  Mid RCA lesion is 75% stenosed.  LV end diastolic pressure is mildly elevated.  Hemodynamic findings consistent with moderate pulmonary hypertension.  1. Severe 2 vessel obstructive CAD. This involves the proximal LCx and a Co dominant RCA. 2. LV filling pressures are mildly elevated 17-18 mm Hg 3. Moderate pulmonary HTN with mean PAP 39 mm Hg 4. Chronic respiratory failure with pCO2 of 67 and bicarb of 40. 5. Cardiac index 2.21. Plan: reviewed with Dr  [place]. Patient presented with predominant symptoms of SOB. No significant chest pain. 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 He has significant access issues for coronary intervention. It may be prudent at this time to maximize his CHF therapy and control his arrhythmia. If he fails to respond to these measures then PCI of the RCA and LCx could be considered. If so then I would consider access from a left radial approach. If femoral access is needed then would need to use a very long access sheath to deal with iliac tortuosity.    Cardiac Studies   Right/Left Cardiac Catheterization 11/16/2019:    Prox Cx lesion is 95% stenosed.   Prox RCA lesion is 90% stenosed.   Mid RCA lesion is 75% stenosed.   LV end diastolic pressure is mildly elevated.   Hemodynamic findings consistent with moderate pulmonary hypertension.    1. Severe 2 vessel obstructive CAD. This involves the proximal LCx and a Co dominant RCA.  2. LV filling pressures are mildly elevated 17-18 mm Hg  3. Moderate pulmonary HTN with mean PAP 39 mm Hg  4. Chronic respiratory failure with pCO2 of 67 and bicarb of 40.  5. Cardiac index 2.21.    Plan: Reviewed with Dr Irish Lack. Patient presented with predominant symptoms of SOB. No significant chest pain. 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 He has significant access issues for coronary intervention. It may be prudent at this time to maximize his CHF therapy and control his arrhythmia. If he fails to respond to these measures then PCI of the RCA and LCx could be considered. If so then I would consider access from a left radial approach. If femoral access is needed then would need to use a very long access sheath to deal with iliac tortuosity.    Patient Profile    Mr. Eckert is a 84 y.o. male with a history of chronic diastolic heart failure, hypertension interstitial lung disease on home O2 of 4L, and obesity. He presented to [place] with lower extremity edema and was found to be in atrial fibrillation/flutter with RVR with an elevated troponin. He was transferred to [facility] for further management.   Assessment & Plan    NSTEMI  - Troponin at [facility] 0.8 >> 1.09 >> 1.56 >> 1.80  - Echo at [facility] showed worsening LV function.  - Right/Left Cardiac Catheterization on 2/15 showed 90% stenosis of proximal RCA and 95% stenosis of proximal CX.  - Given access issues and lack of chest pain as his presenting symptom, intervention was deferred. Plan for CHF medical therapy as well as rate and rhythm control.   - Continue Aspirin 11m daily, Coreg 6.242mtwice daily, and Lipitor 4046maily.    Atrial Fibrillation/Flutter with RVR  - Cardizem drip was initially started and then switched to a beta-blocker given his EF of 30-35% [facility]  - EP consulted and Tikosyn started. QTc markedly prolonged (QTc > 550 ms) after first dose yesterday morning. Evening dose was held. QTc 456 ms today. Will defer to EP. - CHA2DS2-VASc = 5. Eliquis 5mg40mice daily started this admission.    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  - Currently on IV Lasix 80mg82mce daily.  Documented urinary output of 1.9 L over the last 24 hours and net negative 4 L since admission. Weight 246.7 lbs today, up from 2441.5 lbs yesterday. Unsure if this is right. Renal function stable. - Continue current dose of IV Lasix. - Continue Entresto 24-63m twice daily and Coreg 6.239mtwice daily.  - Continue to monitor daily weights, strict I/O's, and renal function.    Hypertension  - BP well controlled. - Continue medications as above.    Hyperlipidemia  - Lipid panel from 11/13/2019: Cholesterol 124; HDL 37; LDL Cholesterol 76; Triglycerides 54; VLDL 11  - LDL goal <70 given CAD.  - Continue Lipitor 40106maily.   For questions or updates, please contact [facility] consult [email or web address] for contact info under   I have examined the patient and reviewed assessment and plan and discussed with patient.  Agree with above as stated.    Not tolerating Tikosyn due to QT prolongation.  Await recs from EP for medication changes and additional monitoring as an outpatient.    Medical therapy for CAD.  No angina at this time.  Home O2 for ILD.   [address]     Signed, CalDarreld McleanA-C  11/18/2019, 7:23 AM

## 2019-11-18 NOTE — Progress Notes (Addendum)
Progress Note  Patient Name: [name] Date of Encounter: 11/18/2019  Primary Cardiologist: Berniece Salines, DO   Subjective   Feels better today, no SOB at rest with his O2., laying at [place] comfortably, no CP, no palpitations  Inpatient Medications    Scheduled Meds: . apixaban  5 mg Oral BID  . aspirin EC  81 mg Oral Daily  . atorvastatin  40 mg Oral q1800  . carvedilol  6.25 mg Oral BID WC  . furosemide  80 mg Intravenous BID  . sacubitril-valsartan  1 tablet Oral BID  . sodium chloride flush  3 mL Intravenous Q12H  . sodium chloride flush  3 mL Intravenous Q12H  . sodium chloride flush  3 mL Intravenous Q12H  . sodium chloride flush  3 mL Intravenous Q12H   Continuous Infusions: . sodium chloride    . sodium chloride Stopped (11/16/19 1610)  . sodium chloride 10 mL/hr at 11/16/19 1610  . sodium chloride     PRN Meds: sodium chloride, sodium chloride, sodium chloride, acetaminophen, fluticasone, metoprolol tartrate, pantoprazole, sodium chloride flush, sodium chloride flush, sodium chloride flush   Vital Signs    Vitals:   11/17/19 1800 11/17/19 1949 11/18/19 0300 11/18/19 0517  BP: 130/74 102/72  120/75  Pulse:  72  60  Resp:      Temp:  (!) 97.3 F (36.3 C)  98.1 F (36.7 C)  TempSrc:  Oral  Oral  SpO2: (!) 88% (!) 89% 96% 95%  Weight:    111.9 kg  Height:        Intake/Output Summary (Last 24 hours) at 11/18/2019 0109 Last data filed at 11/18/2019 0500 Gross per 24 hour  Intake 1389.17 ml  Output 1700 ml  Net -310.83 ml   Last 3 Weights 11/18/2019 11/17/2019 11/16/2019  Weight (lbs) 246 lb 11.1 oz 241 lb 8 oz 247 lb 9.2 oz  Weight (kg) 111.9 kg 109.544 kg 112.3 kg      Telemetry    SR, 70's, 1st degree AVBlock, c/w brief SVT's with less frequency and shorter duration. - Personally Reviewed  ECG    From this morning ATach 116, unable to establish accurate QT  Now, repeat EKG is SR 71, PAC, mannually measured QT 474m, QTc 5035m-  Personally Reviewed  Physical Exam   GEN: No acute distress.   Neck: No JVD Cardiac: RRR, 1-2/6 SM, no rubs, or gallops.  Respiratory: soft crackles at the bases, no wheezes, moving air better today GI: Soft, nontender, non-distended  MS: 1++ edema; No deformity. Neuro:  Nonfocal  Psych: Normal affect     Labs    High Sensitivity Troponin:   Recent Labs  Lab 11/13/19 1726 11/13/19 1849  TROPONINIHS 684* 571*      Chemistry Recent Labs  Lab 11/16/19 0348 11/16/19 1412 11/16/19 1421 11/16/19 1421 11/16/19 2044 11/17/19 0425 11/18/19 0408  NA 144   < > 143  --   --  142 142  K 3.6   < > 3.4*   < > 3.4* 4.5 4.6  CL 94*  --   --   --   --  95* 96*  CO2 40*  --   --   --   --  38* 38*  GLUCOSE 105*  --   --   --   --  96 95  BUN 22  --   --   --   --  15 15  CREATININE 0.92  --   --   --   --  0.88 0.87  CALCIUM 8.5*  --   --   --   --  8.6* 8.6*  [name]  --   --   --   --   --   --  6.7  ALBUMIN  --   --   --   --   --   --  2.6*  AST  --   --   --   --   --   --  16  [name]  --   --   --   --   --   --  14  [name]  --   --   --   --   --   --  53  [name]  --   --   --   --   --   --  0.9  GFRNONAA >60  --   --   --   --  >60 >60  GFRAA >60  --   --   --   --  >60 >60  ANIONGAP 10  --   --   --   --  9 8   < > = values in this interval not displayed.     Hematology Recent Labs  Lab 11/16/19 0348 11/16/19 0348 11/16/19 1412 11/16/19 1421 11/17/19 0425 11/18/19 0408  WBC 10.3  --   --   --  10.9* 10.9*  RBC 4.02*   < >  --   --  4.08* 4.01*  4.02*  HGB 12.3*  --    < > 13.6 12.3* 12.3*  HCT 40.8  --    < > 40.0 41.5 40.6  MCV 101.5*  --   --   --  101.7* 101.2*  MCH 30.6  --   --   --  30.1 30.7  MCHC 30.1  --   --   --  29.6* 30.3  RDW 15.5  --   --   --  15.7* 15.7*  PLT 235  --   --   --  237 213   < > = values in this interval not displayed.    BNPNo results for input(s): BNP, PROBNP in the last 168 hours.   DDimer No results for input(s):  DDIMER in the last 168 hours.   Radiology      Cardiac Studies    11/16/2019: [facility]  Prox Cx lesion is 95% stenosed.  Prox RCA lesion is 90% stenosed.  Mid RCA lesion is 75% stenosed.  LV end diastolic pressure is mildly elevated.  Hemodynamic findings consistent with moderate pulmonary hypertension.  1. Severe 2 vessel obstructive CAD. This involves the proximal LCx and a Co dominant RCA.  2. LV filling pressures are mildly elevated 17-18 mm Hg 3. Moderate pulmonary HTN with mean PAP 39 mm Hg 4. Chronic respiratory failure with pCO2 of 67 and bicarb of 40. 5. Cardiac index 2.21.   Plan: reviewed with Dr Irish Lack. Patient presented with predominant symptoms of SOB. No significant chest pain. 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 He has significant access issues for coronary intervention. It may be prudent at this time to maximize his CHF therapy and control his arrhythmia. If he fails to respond to these measures then PCI of the RCA and LCx could be considered. If so then I would consider access from a left radial approach. If femoral access is needed then would need to use a very  long access sheath to deal with iliac tortuosity.    Echocardiogram report from [facility]: RVIDd: 3.6 cm LVOT Diam: 2.0 cm IVSd: 1.4 cm LVIDd: 5.0 cm LVPWd: 1.4 cm LVIDs: 3.9 cm EF(Teich): 43.01 % LA Diam: 4.2 cm Ao Diam: 3.3 cm LAESV MOD A4C: 86.3 ml LAESV MOD A2C: 122.7 ml LAESV Index (A-L): 48.69 ml/m ------------ DOPPLER MV E Vel: 1.25 m/s MV A Vel: 0.00 m/s MV PHT: 33.51 ms MVA By PHT: 6.57 cm LVOT Vmax: 0.97 m/s AV Vmax: 2.75 m/s AVA Vmax: 1.10 cm AVA (VTI): 1.06 cm TR Vmax: 3.03 m/s TR maxPG: 37 mmHg RVSP: 51.65 mmHg   FINDINGS ------- Procedure:2D images, m-mode, color and spectral Doppler were obtained and reviewed. ECG rhythm:Atrial fibrillation. Study quality:This was a technically adequate study. Left  Ventricle:The left ventricular size is normal. There is moderate concentric left ventricular hypertrophy. Overall left ventricular systolic function is moderate-severely impaired with, an EF between 30 - 35 %. No obvious regional wall motion abnormalities were noted. Unable to determine diastolic function dueto rhtyhm. Right Ventricle:The right ventricle is moderately enlarged. The right ventricular systolic function is moderately impaired. Left Atrium:Left atrium is moderately dilated by volume. Right Atrium:The right atrium is moderately enlarged. ASD/VSD:No evidence of interatrial communication by color flow doppler analysis. Aortic Valve:The aortic valve is trileaflet and appears structurally normal. There is mild aortic regurgitation. Mild to m oderate aortic stenosis with peak/mean pressure gradient of {30 mmhg maxPG / {18 mmhg meanPG, the aortic valve area by continuity equation is 1.1 cm square. NDI = 0.34 ( low flow ) Mitral Valve:Normal appearing mitral valve. Moderate mitral regurgitation is present. Tricuspid Valve:The tricuspid valve appears structurally normal. Moderate tricuspid regurgitation present. The right ventricular systolic pressure, as measured by Doppler, is 52 mmhg. Pulmonic Valve:The pulmonic valve is normal. Trace/mild (physiologic) pulmonic regurgitation. Aorta:The aortic root, ascending aorta and aortic arch appear normal. IVC:The inferior vena cava is dilated with no significant inspiratory collapse which is consistent estimated right atrial pressure of 15 mmHg. Pericardium:There is no pericardial effusion.  CONCLUSIONS ----------- 1. This was a technically adequate study. 2. The left ventricular size is normal. 3. There is moderate concentric left ventricular hypertrophy. 4. Overall left ventricular systolic function is moderate-severely impaired with, an EF between 30 - 35 %. 5. The right ventricle is moderately enlarged. 6. The right  ventricular systolic function is moderately impaired. 7. Left atrium is moderately dilated by volume. 8. The right atrium is moderately enlarged. 9. There is mild aortic regurgitation. 10. Mild to moderate aortic stenosis with peak/mean pressure gradient of 30 mmhg maxPG / {18 mmhg meanPG, the aortic valve area by continuity equation is 1.1 cm square. NDI = 0.34 ( low flow ) 11. Moderate mitral regurgitation is present. 12. Moderate tricuspid regurgitation present. 13. The right ventricular systolic pressure, as measured by Doppler, is 52 mmhg. 14. Trace/mild (physiologic) pulmonic regurgitation. 15. The aortic root, ascending aorta and aortic arch appear normal. 16. There is no pericardial effusion.    Patient Profile     84 y.o. male  with a hx of COPD, Pulmonary fibrosis on home O2. HTN, HFpEF, OA  initially sought medical attention at [facility] for fast HR noted at home that started when he got  Up to use the bathroom, also mentioned noting increased LE swelling over about a weeks time.  No reported CP or SOB  Initially though to be an SVT was given adenosine and IV with no reported effect, started on dilt and the  rhythm slowed revealing AFlutter.  Cardiology was consulted to the case, TTE noted LVEF reduced from normal in 2020 to 30-35%, some elevation in Trop 0.8>1.09>1.56>1.80, planned to transfer to [facility] for cath and further management He was initially treated with diltiazem though switched to BB and heparin for a/c, and lasix for diuresis.  EP was asked to the case for rhythm management strategies  Assessment & Plan     1. New onset atrial arrhythmias     AFlutter     Afib     An ATach as well  CHA2DS2Vasc is 5, started on heparin gtt this stay >> Eliquis is current He has RV pressure of 51.108mHg, O2 dependent pul;monary disease RA and LA described as moderately enlarged, LA measured 462mwith mod MR Maintaining rhythm long term mat prove difficult and  will llikely require AAD.  He is maintaining SR more with significant reduction in arrhythmia burden (even prior to the single Tikosyn dose)  This likely has more to do with improving respiratory status  He had marked QT prolongation with a single dose of Tikosyn yesterday Dr. KlCaryl Comesentions some concern of likely not being a class III candidate  K+ 4.6 Mag 2.1 Creat 0.87 (stable) Mag 2.4   QT remains  Too long for Tikosyn re-try by my measurement.  Likely will need to revisit AAD options  I have discussed with Dr. KlCaryl Comesor now, no AAD  I have orderd outpatient monitoring And will make EP follow up     Continue with primary care team otherwise:   2. Acute CHF exacerbation 3. NICM     Cath note feels CM is out of proportion to his CAD     Getting ongoing IV diueresis with primary cardiology, goal K+ is 4.0 or better     Fluid neg (cummaltively) -[id number] euvolemic yesterday if accurate)     Weight without much change from admission, clinically looks better 4. CAD     No CAD     Plans for medical management currently 5. Chronic resp failure 6. COPD 7. ILD     On home O2       For questions or updates, please contact [facility] Please consult [email or web address] for contact info under        Signed, RenBaldwin JamaicaA-C  11/18/2019, 7:22 AM   As above

## 2019-11-18 NOTE — Progress Notes (Signed)
PROGRESS NOTE    [name]  BUL:[id number] DOB: 09/29/1936 DOA: 11/13/2019 PCP: [name], MD  Brief Narrative:  Patient is an 84 year old Caucasian male, obese, with past medical history significant for chronic HFpEF, 4L O2-dependentpulmonary fibrosis and hypertension.  -Presented to [facility] emergency room with worsening dyspnea, lower extremity edema and rapid A. Fib -On further work-up he was noted to have newly depressed LV systolic function of 30 to 35% down from 60 to 65% in June 2020, he was transferred to [facility] underwent right and left heart cath which noted severe two-vessel disease, in addition also felt to have a component of tachycardia related cardiomyopathy, -Hospital course notable for rapid A. fib and some atrial tachycardia -EP was consulted, he was given a dose of Tikosyn which resulted in significant worsening of his QTC dose  Assessment & Plan:   Non-ST elevation MI Acute systolic and diastolic CHF -Troponin at [facility] trended up to 1.8 from 0.8 She consulted underwent right and left heart cath which showed 90% stenosis of proximal RCA and 95% of proximal circumflex, subsequently had access issues and given lack of chest pain intervention was deferred, he was started on medical therapy for CHF as well as rhythm issues -Currently on aspirin Coreg and Lipitor -Also on IV Lasix, improved improving with diuresis, 4 L negative since admission -Continue current dose of IV Lasix -Also started on Entresto this admission -Monitor I's/O daily renal function  New onsetAtrial Flutterwith RVR/atrial fibrillation with RVR/atrial tachycardia -Heart rate is better controlled, but intermittently rapid.   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   -Stable, needs new pulmonologist at discharge, reports that his previous pulmonologist retired  Hypertension: -Continue to optimize -Antihypertensives as above with carvedilol, Lasix as well as Entresto  Osteoarthritis. -Optimize pain control.  Acute urinary retention, history of BPH:  -Continue foley catheter placed 2/11 -Will need TOV  Macrocytic Anemia -Patient's hemoglobin/hematocrit is now 12.3/41.5 and MCV is 101.7 -Stable  Obesity -Estimated body mass index is 30.83 kg/m as calculated from the following:   Height as of this encounter: _0  (1.905 m).   Weight as of this encounter: 111.9 kg. -Weight Loss and Dietary Counseling given   DVT prophylaxis: Eliquis Code Status: Full code Family Communication: No family at bedside Disposition Plan: Home pending adequate diuresis Consultants:   Cardiology  EP cardiology  Procedures:  Cardiac Catheterization  Prox Cx lesion is 95% stenosed.  Prox RCA lesion is 90% stenosed.  Mid RCA lesion is 75% stenosed.  LV end diastolic pressure is mildly elevated.  Hemodynamic findings consistent with moderate pulmonary hypertension.   1. Severe 2 vessel obstructive CAD. This involves the proximal LCx and a Co dominant RCA.  2. LV filling pressures are mildly elevated 17-18 mm Hg 3. Moderate pulmonary HTN with mean PAP 39 mm Hg 4. Chronic respiratory failure with pCO2 of 67 and bicarb of 40. 5. Cardiac index 2.21.   Plan: reviewed with Dr Irish Lack. Patient presented with predominant symptoms of SOB. No significant chest pain. 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 He has significant access issues for coronary intervention.  It may be prudent at this time to maximize his CHF therapy and control his arrhythmia. If he fails to respond to  these measures then PCI of the RCA and LCx could be considered. If so then I would consider access from a left radial approach. If femoral access is needed then would need to use a very long access sheath to deal with iliac tortuosity.     Antimicrobials:  Anti-infectives (From admission, onward)   None     Subjective: -Feels a little better, breathing is improving, not back to baseline yet, still has considerable swelling  Objective: Vitals:   11/18/19 0517 11/18/19 0822 11/18/19 1343 11/18/19 1346  BP: 120/75 120/65    Pulse: 60 77    Resp:      Temp: 98.1 F (36.7 C)     TempSrc: Oral     SpO2: 95%  93% 93%  Weight: 111.9 kg     Height:        Intake/Output Summary (Last 24 hours) at 11/18/2019 1509 Last data filed at 11/18/2019 1500 Gross per 24 hour  Intake 649.17 ml  Output 1925 ml  Net -1275.83 ml   Filed Weights   11/16/19 0537 11/17/19 0500 11/18/19 0517  Weight: 112.3 kg 109.5 kg 111.9 kg   Examination: Physical Exam:  Gen: Elderly chronically ill-appearing male sitting up in bed AAOx3, no distress HEENT: Positive JVD Lungs: Bibasilar crackles CVS: P3-A2, systolic murmur noted Abd: soft, Non tender, non distended, BS present Extremities: 1-2+ edema Skin: no new rashes   Data Reviewed: I have personally reviewed following labs and imaging studies  CBC: Recent Labs  Lab 11/14/19 0410 11/14/19 0410 11/15/19 0443 11/15/19 0443 11/16/19 0348 11/16/19 1412 11/16/19 1421 11/17/19 0425 11/18/19 0408  WBC 11.1*  --  8.8  --  10.3  --   --  10.9* 10.9*  NEUTROABS  --   --   --   --   --   --   --   --  8.5*  HGB 12.6*   < > 12.2*   < > 12.3* 13.9 13.6 12.3* 12.3*  HCT 41.1   < > 40.1   < > 40.8 41.0 40.0 41.5 40.6  MCV 98.6  --  100.5*  --  101.5*  --   --  101.7* 101.2*  PLT 235  --  223  --  235  --   --  237 213   < > = values in this interval not displayed.   Basic Metabolic Panel: Recent Labs  Lab 11/14/19 0410 11/14/19 0410  11/15/19 0443 11/15/19 0443 11/16/19 0348 11/16/19 0348 11/16/19 1412 11/16/19 1421 11/16/19 2044 11/17/19 0425 11/18/19 0408  NA 141   < > 141   < > 144  --  143 143  --  142 142  K 3.8   < > 3.6   < > 3.6   < > 3.5 3.4* 3.4* 4.5 4.6  CL 97*  --  98  --  94*  --   --   --   --  95* 96*  CO2 33*  --  35*  --  40*  --   --   --   --  38* 38*  GLUCOSE 92  --  97  --  105*  --   --   --   --  96 95  BUN 22  --  22  --  22  --   --   --   --  15 15  CREATININE 0.95  --  0.98  --  0.92  --   --   --   --  0.88 0.87  CALCIUM 8.7*  --  8.8*  --  8.5*  --   --   --   --  8.6* 8.6*  MG  --   --   --   --  1.8  --   --   --  1.8 2.4 2.1  PHOS  --   --   --   --   --   --   --   --   --   --  3.4   < > = values in this interval not displayed.   GFR: Estimated Creatinine Clearance: 90 mL/min (by C-G formula based on SCr of 0.87 mg/dL). Liver Function Tests: Recent Labs  Lab 11/18/19 0408  AST 16  [name] 14  [name] 53  [name] 0.9  [name] 6.7  ALBUMIN 2.6*   No results for input(s): LIPASE, [name], [name], [name], [name], TROPONINI in the last 168 hours. BNP (last 3 results) No results for input(s): PROBNP in the last 8760 hours. HbA1C: No results for input(s): HGBA1C in the last 72 hours. CBG: No results for input(s): GLUCAP in the last 168 hours. Lipid Profile: No results for input(s): CHOL, HDL, LDLCALC, TRIG, CHOLHDL, LDLDIRECT in the last 72 hours. Thyroid Function Tests: No results for input(s): TSH, [name], [name], [name], THYROIDAB in the last 72 hours. Anemia Panel: Recent Labs    11/18/19 0408  VITAMINB12 288  FOLATE 7.8  FERRITIN 153  [name] 232*  IRON 31*  RETICCTPCT 1.5   Sepsis Labs: No results for input(s): PROCALCITON, LATICACIDVEN in the last 168  hours.  Recent Results (from the past 240 hour(s))  Respiratory Panel by RT PCR (Flu A&B, Covid) - Nasopharyngeal Swab     Status: None   Collection Time: 11/13/19  8:40 PM   Specimen: Nasopharyngeal Swab  Result Value Ref Range Status   SARS Coronavirus 2 by RT PCR NEGATIVE NEGATIVE Final    Comment: (NOTE) SARS-CoV-2 target nucleic acids are NOT DETECTED. The SARS-CoV-2 RNA is generally detectable in upper respiratoy specimens during the acute phase of infection. The lowest concentration of SARS-CoV-2 viral copies this assay can detect is 131 copies/mL. A negative result does not preclude SARS-Cov-2 infection and should not be used as the sole basis for treatment or other patient management decisions. A negative result may occur with  improper specimen collection/handling, submission of specimen other than nasopharyngeal swab, presence of viral mutation(s) within the areas targeted by this assay, and inadequate number of viral copies (<131 copies/mL). A negative result must be combined with clinical observations, patient history, and epidemiological information. The expected result is Negative. Fact Sheet for Patients:  PinkCheek.be Fact Sheet for Healthcare Providers:  GravelBags.it This test is not yet ap proved or cleared by the [place] FDA and  has been authorized for detection and/or diagnosis of SARS-CoV-2 by FDA under an Emergency Use Authorization (EUA). This EUA will remain  in effect (meaning this test can be used) for the duration of the COVID-19 declaration under Section 564(b)(1) of the Act, 21 U.S.C. section 360bbb-3(b)(1), unless the authorization is terminated or revoked sooner.    Influenza A by PCR NEGATIVE  NEGATIVE Final   Influenza B by PCR NEGATIVE NEGATIVE Final    Comment: (NOTE) The Xpert Xpress SARS-CoV-2/FLU/RSV assay is intended as an aid in  the diagnosis of influenza from Nasopharyngeal  swab specimens and  should not be used as a sole basis for treatment. Nasal washings and  aspirates are unacceptable for Xpert Xpress SARS-CoV-2/FLU/RSV  testing. Fact Sheet for Patients: PinkCheek.be Fact Sheet for Healthcare Providers: GravelBags.it This test is not yet approved or cleared by the [place] FDA and  has been authorized for detection and/or diagnosis of SARS-CoV-2 by  FDA under an Emergency Use Authorization (EUA). This EUA will remain  in effect (meaning this test can be used) for the duration of the  Covid-19 declaration under Section 564(b)(1) of the Act, 21  U.S.C. section 360bbb-3(b)(1), unless the authorization is  terminated or revoked. Performed at [facility], [place] [address], [place],  [address]      RN Pressure Injury Documentation:     Estimated body mass index is 30.83 kg/m as calculated from the following:   Height as of this encounter: _0  (1.905 m).   Weight as of this encounter: 111.9 kg.  Malnutrition Type:    Radiology Studies: DG CHEST PORT 1 VIEW  Result Date: 11/18/2019 CLINICAL DATA:  Shortness of breath. EXAM: PORTABLE CHEST 1 VIEW COMPARISON:  04/06/2019 and CT chest 10/21/2018. FINDINGS: Trachea is midline. Heart is enlarged. Thoracic aorta is calcified. Calcified pleural plaques bilaterally. Interstitial prominence is seen diffusely, similar to the prior exam. No definite superimposed airspace consolidation or pleural fluid. IMPRESSION: 1. No acute findings. 2. Calcified pleural plaques with interstitial prominence, indicative of asbestosis, as on 10/21/2018. 3.  Aortic atherosclerosis (ICD10-I70.0). Electronically Signed   By: Lorin Picket M.D.   On: 11/18/2019 09:22    Scheduled Meds: . apixaban  5 mg Oral BID  . aspirin EC  81 mg Oral Daily  . atorvastatin  40 mg Oral q1800  . carvedilol  6.25 mg Oral BID WC  . furosemide  80 mg Intravenous BID  .  sacubitril-valsartan  1 tablet Oral BID  . sodium chloride flush  3 mL Intravenous Q12H  . sodium chloride flush  3 mL Intravenous Q12H  . sodium chloride flush  3 mL Intravenous Q12H  . sodium chloride flush  3 mL Intravenous Q12H   Continuous Infusions: . sodium chloride    . sodium chloride 10 mL/hr at 11/16/19 1610  . sodium chloride      LOS: 5 days   Domenic Polite, MD [facility]

## 2019-11-18 NOTE — Progress Notes (Signed)
CARDIAC REHAB PHASE I   PRE:  Rate/Rhythm: flipping in and out of afib  76 SR to 121 afib  BP:  Supine: 91/64  Sitting:   Standing:    SaO2: 93% 5L  MODE:  Ambulation: 200 ft   POST:  Rate/Rhythm: 97 SR  To 130 afib    SR   BP:  Supine:   Sitting: 114/78  Standing:    SaO2: 86% 6L   91-93% 6L with sitting 1359-1437 Pt walked 200 ft on 6L with rollator and asst x 2 with gait belt use. Pt stopped once to rest. Tired easily. In and out of afib. Denied being able to feel the fluttering and denied dizziness with low BP. To recliner with wife in room. Stayed with pt until sats back to 90"s and then back to 5L.  No c/o CP.   Graylon Good, RN BSN  11/18/2019 2:31 PM

## 2019-11-18 NOTE — Plan of Care (Signed)
  Problem: Activity: Goal: Capacity to carry out activities will improve Outcome: Progressing   Problem: Cardiovascular: Goal: Vascular access site(s) Level 0-1 will be maintained Outcome: Progressing

## 2019-11-18 NOTE — Plan of Care (Signed)
  Problem: Education: ?Goal: Knowledge of General Education information will improve ?Description: Including pain rating scale, medication(s)/side effects and non-pharmacologic comfort measures ?Outcome: Progressing ?  ?Problem: Activity: ?Goal: Capacity to carry out activities will improve ?Outcome: Progressing ?  ?

## 2019-11-18 NOTE — Telephone Encounter (Signed)
Patient enrolled for [employer] to mail a 14 day ZIO XT long term holter monitor to his home.  Instructions included in monitor kit and sent as My Chart message as well.

## 2019-11-19 ENCOUNTER — Telehealth: Payer: Self-pay | Admitting: Cardiology

## 2019-11-19 LAB — BASIC METABOLIC PANEL
Anion gap: 8 (ref 5–15)
BUN: 19 mg/dL (ref 8–23)
CO2: 38 mmol/L — ABNORMAL HIGH (ref 22–32)
Calcium: 8.6 mg/dL — ABNORMAL LOW (ref 8.9–10.3)
Chloride: 94 mmol/L — ABNORMAL LOW (ref 98–111)
Creatinine, Ser: 0.89 mg/dL (ref 0.61–1.24)
GFR calc Af Amer: >60 mL/min (ref >60)
GFR calc non Af Amer: >60 mL/min (ref >60)
Glucose, Bld: 99 mg/dL (ref 70–99)
Potassium: 4.1 mmol/L (ref 3.5–5.1)
Sodium: 140 mmol/L (ref 135–145)

## 2019-11-19 LAB — CBC
HCT: 40.1 % (ref 39.0–52.0)
Hemoglobin: 12.2 g/dL — ABNORMAL LOW (ref 13.0–17.0)
MCH: 30.5 pg (ref 26.0–34.0)
MCHC: 30.4 g/dL (ref 30.0–36.0)
MCV: 100.3 fL — ABNORMAL HIGH (ref 80.0–100.0)
Platelets: 238 K/uL (ref 150–400)
RBC: 4 MIL/uL — ABNORMAL LOW (ref 4.22–5.81)
RDW: 15.4 % (ref 11.5–15.5)
WBC: 10.9 K/uL — ABNORMAL HIGH (ref 4.0–10.5)
nRBC: 0 % (ref 0.0–0.2)

## 2019-11-19 MED ORDER — FUROSEMIDE 40 MG PO TABS
40.0000 mg | ORAL_TABLET | Freq: Two times a day (BID) | ORAL | Status: DC
  Administered 2019-11-19 – 2019-11-20 (×2): 40 mg via ORAL
  Filled 2019-11-19 (×2): qty 1

## 2019-11-19 NOTE — Progress Notes (Signed)
Occupational Therapy Treatment Patient Details Name: [name] MRN: [id number] DOB: 1935-11-01 Today's Date: 11/19/2019    History of present illness Pt is a 84 y.o. M with significant PMH of chronic HFpEF, 4L O2 dependent pulmonary fibrosis, and HTN who was admitted with ventricular rate in 160s and new onset atrial flutter with RVR. ECG showed newly depressed LV systolic function LVEF 85-92%.    OT comments  Patient continues to make steady progress towards goals in skilled OT session. Patient's session encompassed reinforcement of AE and energy conservation techniques with handout provided. Pt able to utilize reacher and sock aid with min cues for sequencing and requested to return back to bed with 1 hand held assist to ambulate few steps. Pt continues to improve with functional tasks, will continue to follow acutely.    Follow Up Recommendations  Home health OT;Supervision/Assistance - 24 hour    Equipment Recommendations  Other (comment)(Rollator/ adaptive equipment)    Recommendations for Other Services      Precautions / Restrictions Precautions Precautions: Fall Precaution Comments: 4L O2 baseline Restrictions Weight Bearing Restrictions: No       Mobility Bed Mobility Overal bed mobility: Modified Independent             General bed mobility comments: HOB elevated, increased time and effort to get back to bed  Transfers Overall transfer level: Needs assistance Equipment used: 1 person hand held assist Transfers: Sit to/from Stand Sit to Stand: Min guard Stand pivot transfers: Min guard       General transfer comment: Min gaurd from chair, rocking and momentum used to power up    Balance Overall balance assessment: Needs assistance   Sitting balance-Leahy Scale: Good     Standing balance support: Single extremity supported Standing balance-Leahy Scale: Good                             ADL either performed or assessed with clinical  judgement   ADL Overall ADL's : Needs assistance/impaired                     Lower Body Dressing: With adaptive equipment;Cueing for sequencing;Sitting/lateral leans;Set up Lower Body Dressing Details (indicate cue type and reason): Reinforced sock aid and reacher Toilet Transfer: Ambulation;Minimal assistance;RW Toilet Transfer Details (indicate cue type and reason): Simulated in sit<>stand back to bed from chair         Functional mobility during ADLs: Rolling walker;Cueing for safety;Set up;Min guard General ADL Comments: Reinforced AE and brought handout for referencing at home. Pt now set up for AE, min cues for sequencing and educational pointers     Vision       Perception     Praxis      Cognition Arousal/Alertness: Awake/alert Behavior During Therapy: WFL for tasks assessed/performed Overall Cognitive Status: Within Functional Limits for tasks assessed                                          Exercises     Shoulder Instructions       General Comments      Pertinent Vitals/ Pain       Pain Assessment: No/denies pain  Home Living  Prior Functioning/Environment              Frequency           Progress Toward Goals  OT Goals(current goals can now be found in the care plan section)  Progress towards OT goals: Progressing toward goals  Acute Rehab OT Goals Patient Stated Goal: to go home OT Goal Formulation: With patient Time For Goal Achievement: 11/29/19 Potential to Achieve Goals: Good  Plan Discharge plan remains appropriate    Co-evaluation                 AM-PAC OT "6 Clicks" Daily Activity     Outcome Measure   Help from another person eating meals?: None Help from another person taking care of personal grooming?: A Little Help from another person toileting, which includes using toliet, bedpan, or urinal?: A Little Help from another  person bathing (including washing, rinsing, drying)?: A Little Help from another person to put on and taking off regular upper body clothing?: A Little Help from another person to put on and taking off regular lower body clothing?: A Little 6 Click Score: 19    End of Session Equipment Utilized During Treatment: Oxygen  OT Visit Diagnosis: Unsteadiness on feet (R26.81);Muscle weakness (generalized) (M62.81)   Activity Tolerance Patient tolerated treatment well   Patient Left in bed;with call bell/phone within reach   Nurse Communication Mobility status        Time: 0955-1006 OT Time Calculation (min): 11 min  Charges: OT General Charges $OT Visit: 1 Visit OT Treatments $Self Care/Home Management : 8-22 mins  Corinne [place] [place], [facility] [phone] [place] 11/19/2019, 11:39 AM

## 2019-11-19 NOTE — Progress Notes (Addendum)
Progress Note  Patient Name: [name] Date of Encounter: 11/19/2019  Primary Cardiologist: William Pick Tobb, DO   Subjective   No acute overnight events. Patient states breathing is back to baseline. No chest pain, palpitations, lightheadedness, or dizziness.   Inpatient Medications    Scheduled Meds: . apixaban  5 mg Oral BID  . aspirin EC  81 mg Oral Daily  . atorvastatin  40 mg Oral q1800  . carvedilol  6.25 mg Oral BID WC  . furosemide  80 mg Intravenous BID  . sacubitril-valsartan  1 tablet Oral BID  . sodium chloride flush  3 mL Intravenous Q12H  . sodium chloride flush  3 mL Intravenous Q12H  . sodium chloride flush  3 mL Intravenous Q12H  . sodium chloride flush  3 mL Intravenous Q12H   Continuous Infusions: . sodium chloride    . sodium chloride 10 mL/hr at 11/16/19 1610  . sodium chloride     PRN Meds: sodium chloride, sodium chloride, sodium chloride, acetaminophen, fluticasone, pantoprazole, sodium chloride flush, sodium chloride flush, sodium chloride flush   Vital Signs    Vitals:   11/18/19 1920 11/18/19 2132 11/19/19 0529 11/19/19 0813  BP:  108/83 127/80 117/73  Pulse:   (!) 57 79  Resp:  (!) 21 17   Temp:  97.7 F (36.5 C) 97.9 F (36.6 C)   TempSrc:  Oral Oral   SpO2: 96% 93% 94%   Weight:   108.3 kg   Height:        Intake/Output Summary (Last 24 hours) at 11/19/2019 0914 Last data filed at 11/19/2019 0500 Gross per 24 hour  Intake 500 ml  Output 2000 ml  Net -1500 ml   Last 3 Weights 11/19/2019 11/18/2019 11/17/2019  Weight (lbs) 238 lb 12.8 oz 246 lb 11.1 oz 241 lb 8 oz  Weight (kg) 108.319 kg 111.9 kg 109.544 kg      Telemetry    Normal sinus rhythm with baseline rates in the 60's 80's with frequent episodes at what looks like atrial tachycardia with rates as high as the 130's. - Personally Reviewed  ECG    No new ECG tracing today.  - Personally Reviewed  Physical Exam   GEN: Elderly Caucasian male in no acute distress.    Neck: Supple. No JVD. Cardiac: RRR. II-III systolic murmur best heard at upper sternal border. No rubs or gallops. Right radial cath site soft with no signs of hematoma. Radial pulses 2+ and equal bilaterally. Respiratory: No increased worked of breathing. Mild Crackles noted in bibasilar bases but thinks this is due to interstitial lung disease. No wheezes or rhonchi. GI: Soft, non-distended, and non-tender. Bowel sounds present. MS:1+ pitting edema of bilateral lower extremities. Edema of right upper extremity also noted but patient states this arm is always a little more swollen than the left since he pulled a tendon/ligament. No deformity. Skin: Warm and dry. Neuro:  No focal deficits.  Psych: Normal affect. Responds appropriately.   Labs    High Sensitivity Troponin:   Recent Labs  Lab 11/13/19 1726 11/13/19 1849  TROPONINIHS 684* 571*      Chemistry Recent Labs  Lab 11/17/19 0425 11/18/19 0408 11/19/19 0404  NA 142 142 140  K 4.5 4.6 4.1  CL 95* 96* 94*  CO2 38* 38* 38*  GLUCOSE 96 95 99  BUN _0 CREATININE 0.88 0.87 0.89  CALCIUM 8.6* 8.6* 8.6*  [name]  --  6.7  --  ALBUMIN  --  2.6*  --   AST  --  16  --   [name]  --  14  --   [name]  --  53  --   [name]  --  0.9  --   GFRNONAA >60 >60 >60  GFRAA >60 >60 >60  ANIONGAP _0 Hematology Recent Labs  Lab 11/17/19 0425 11/18/19 0408 11/19/19 0404  WBC 10.9* 10.9* 10.9*  RBC 4.08* 4.01*  4.02* 4.00*  HGB 12.3* 12.3* 12.2*  HCT 41.5 40.6 40.1  MCV 101.7* 101.2* 100.3*  MCH 30.1 30.7 30.5  MCHC 29.6* 30.3 30.4  RDW 15.7* 15.7* 15.4  PLT 237 213 238    BNPNo results for input(s): BNP, PROBNP in the last 168 hours.   DDimer No results for input(s): DDIMER in the last 168 hours.   Radiology    DG CHEST PORT 1 VIEW  Result Date: 11/18/2019 CLINICAL DATA:  Shortness of breath. EXAM: PORTABLE CHEST 1 VIEW COMPARISON:  04/06/2019 and CT chest 10/21/2018. FINDINGS: Trachea is midline. Heart is  enlarged. Thoracic aorta is calcified. Calcified pleural plaques bilaterally. Interstitial prominence is seen diffusely, similar to the prior exam. No definite superimposed airspace consolidation or pleural fluid. IMPRESSION: 1. No acute findings. 2. Calcified pleural plaques with interstitial prominence, indicative of asbestosis, as on 10/21/2018. 3.  Aortic atherosclerosis (ICD10-I70.0). Electronically Signed   By: [name] M.D.   On: 11/18/2019 09:22    Cardiac Studies   Right/Left Cardiac Catheterization 11/16/2019:   Prox Cx lesion is 95% stenosed.   Prox RCA lesion is 90% stenosed.   Mid RCA lesion is 75% stenosed.   LV end diastolic pressure is mildly elevated.   Hemodynamic findings consistent with moderate pulmonary hypertension.    1. Severe 2 vessel obstructive CAD. This involves the proximal LCx and a Co dominant RCA.  2. LV filling pressures are mildly elevated 17-18 mm Hg  3. Moderate pulmonary HTN with mean PAP 39 mm Hg  4. Chronic respiratory failure with pCO2 of 67 and bicarb of 40.  5. Cardiac index 2.21.    Plan: Reviewed with [name]. Patient presented with predominant symptoms of SOB. No significant chest pain. 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 He has significant access issues for coronary intervention. It may be prudent at this time to maximize his CHF therapy and control his arrhythmia. If he fails to respond to these measures then PCI of the RCA and LCx could be considered. If so then I would consider access from a left radial approach. If femoral access is needed then would need to use a very long access sheath to deal with iliac tortuosity.    Patient Profile    [name] is a 84 y.o. male with a history of chronic diastolic heart failure, hypertension interstitial lung disease on home O2 of 4L, and obesity. He presented to [place] with lower extremity edema and was found  to be in atrial fibrillation/flutter with RVR with an elevated troponin. He was transferred to [facility] for further management.   Assessment & Plan    NSTEMI  - Troponin at [facility] 0.8 >> 1.09 >> 1.56 >> 1.80  - Echo at [facility] showed worsening LV function.  - Right/Left Cardiac Catheterization on 2/15 showed 90% stenosis of proximal RCA and 95% stenosis of proximal CX.  - Given access issues and Pollard of chest pain as his  presenting symptom, intervention was deferred. Plan for CHF medical therapy as well as rate and rhythm control.  - Continue Aspirin 93m daily, Coreg 6.261mtwice daily, and Lipitor 4062maily.    Atrial Fibrillation/Flutter with RVR  - Cardizem drip was initially started and then switched to a beta-blocker given his EF of 30-35% [facility]  - Telemetry shows normal sinus rhythm with baseline rates in the 60's to 80's but does have frequent brief episodes of what looks like atrial tachycardia with rates as high as the 130's.  - EP consulted and Tikosyn started. However, patient had markedly prolonged QTc with this so it was discontinued. Per EP note yesterday, no additional antiarrhythmic at this time. They have arranged outpatient monitor and follow-up in their office.  - Continue Coreg 6.2m37mice daily. - CHA2DS2-VASc = 5. Eliquis 5mg 28mce daily started this admission.    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  - Currently on IV Lasix 80mg 50me daily. Documented urinary output of 2 L over the last 24 hours and net negative 5.3 L since admission. Weight 238 lbs today, down from 244 lbs on addmission. Renal function stable. - May be able to switch to PO Lasix 40mg t17m daily today. Can also try compression stockings to help with lower extremity edema.  - Continue Entresto 24-26mg tw45mdaily and Coreg 6.2mg twi50mdaily.  - Continue to monitor daily weights, strict I/O's, and renal function.    Hypertension  - BP well controlled. - Continue medications as above.    Hyperlipidemia  - Lipid panel from 11/13/2019: Cholesterol 124; HDL 37; LDL Cholesterol 76; Triglycerides 54; VLDL 11  - LDL goal <70 given CAD.  - Continue Lipitor 40mg dail50mDisposition: May be OK to go home today if OK with primary team. William. Rosamary Boudreau tIrish Pollard with any additional recommendations. Arranged follow-up with William. Revankar oGeraldo Pitter21 [place] repeat BMET in 1-2 weeks after starting Entresto.  Callie GooSande Rivesxamined the patient and reviewed assessment and plan and discussed with patient.  Agree with above as stated.    Patient feels well.  LE swelling is better than it is at baseline.  WIll double home dose of Lasix to 40 mg BID.  May need to go to 80 mg BID if swelling worsens.  Plan for Zio patch to monitor heart rhythm.   Medical therapy for CAD.  If PCI was needed, would have to consider left radial approach due to tortuosity in the right iliac and right subclavian arteries.  Eliquis for stroke prevention. OK for discharge from cardiac standpoint.  Adelae Yodice VLarae Groomsstions or updates, please contact [facility] Please consult [email or web address] for contact info under

## 2019-11-19 NOTE — Progress Notes (Signed)
PROGRESS NOTE    [name]  ZOX:[id number] DOB: 05-04-1936 DOA: 11/13/2019 PCP: [name], MD  Brief Narrative:  Patient is an 84 year old Caucasian male, obese, with past medical history significant for chronic HFpEF, 4L O2-dependentpulmonary fibrosis and hypertension.  -Presented to [facility] emergency room with worsening dyspnea, lower extremity edema and rapid A. Fib -On further work-up he was noted to have newly depressed LV systolic function of 30 to 35% down from 60 to 65% in June 2020, he was transferred to [facility] underwent right and left heart cath which noted severe two-vessel disease, in addition also felt to have a component of tachycardia related cardiomyopathy, -Hospital course notable for rapid A. fib and some atrial tachycardia -EP was consulted, he was given a dose of Tikosyn which resulted in significant worsening of his QTC dose  Assessment & Plan:   Non-ST elevation MI Acute systolic and diastolic CHF -Troponin at [facility] trended up to 1.8 from 0.8 She consulted underwent right and left heart cath which showed 90% stenosis of proximal RCA and 95% of proximal circumflex, subsequently had access issues and given lack of chest pain intervention was deferred, he was started on medical therapy for CHF as well as rhythm issues -Currently on aspirin Coreg and Lipitor -Improving with diuresis, he is -5.3 L -Appears close to euvolemic, changed to p.o. diuretics today, also c/o dizziness, check orthostatics -Also started on Entresto this admission -discharge planning, home tomorrow if stable  New onsetAtrial Flutterwith RVR/atrial fibrillation with RVR/atrial tachycardia -Heart rate is better controlled, but intermittently rapid.   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   -Stable, needs new pulmonologist at discharge, reports that his previous pulmonologist retired  Hypertension: -Continue to optimize -Antihypertensives as above with carvedilol, Lasix as well as Entresto  Osteoarthritis. -Optimize pain control.  Acute urinary retention, history of BPH:  -Continue foley catheter placed 2/11 -DC foley  Macrocytic Anemia -Patient's hemoglobin/hematocrit is now 12.3/41.5 and MCV is 101.7 -Stable  Obesity -Estimated body mass index is 29.85 kg/m as calculated from the following:   Height as of this encounter: _0  (1.905 m).   Weight as of this encounter: 108.3 kg. -Weight Loss and Dietary Counseling given   DVT prophylaxis: Eliquis Code Status: Full code Family Communication: No family at bedside Disposition Plan: Home tomorrow if stable Consultants:   Cardiology  EP cardiology  Procedures:  Cardiac Catheterization  Prox Cx lesion is 95% stenosed.  Prox RCA lesion is 90% stenosed.  Mid RCA lesion is 75% stenosed.  LV end diastolic pressure is mildly elevated.  Hemodynamic findings consistent with moderate pulmonary hypertension.   1. Severe 2 vessel obstructive CAD. This involves the proximal LCx and a Co dominant RCA.  2. LV filling pressures are mildly elevated 17-18 mm Hg 3. Moderate pulmonary HTN with mean PAP 39 mm Hg 4. Chronic respiratory failure with pCO2 of 67 and bicarb of 40. 5. Cardiac index 2.21.   Plan: reviewed with Dr Irish Lack. Patient presented with predominant symptoms of SOB. No significant chest pain. 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 He has significant access issues for coronary intervention.  It may be prudent at this time to maximize his CHF therapy and control his arrhythmia. If he fails to respond to  these measures then PCI of the RCA and LCx could be considered. If so then I would consider access from a left radial approach. If femoral access is needed then would need to use a very long access sheath to deal with iliac tortuosity.     Antimicrobials:  Anti-infectives (From admission, onward)   None     Subjective: -breathing better , some dizziness today, cough is improving  Objective: Vitals:   11/18/19 1920 11/18/19 2132 11/19/19 0529 11/19/19 0813  BP:  108/83 127/80 117/73  Pulse:   (!) 57 79  Resp:  (!) 21 17   Temp:  97.7 F (36.5 C) 97.9 F (36.6 C)   TempSrc:  Oral Oral   SpO2: 96% 93% 94%   Weight:   108.3 kg   Height:        Intake/Output Summary (Last 24 hours) at 11/19/2019 1358 Last data filed at 11/19/2019 0941 Gross per 24 hour  Intake 600 ml  Output 1200 ml  Net -600 ml   Filed Weights   11/17/19 0500 11/18/19 0517 11/19/19 0529  Weight: 109.5 kg 111.9 kg 108.3 kg   Examination: Physical Exam:  Gen: elderly chronically ill male, AAOx3, no distress HEENT: no JVD Lungs: basilar crackles CVS: S!S2/RRR Abd: soft, Non tender, non distended, BS present Extremities: trace edema Skin: no new rashes   Data Reviewed: I have personally reviewed following labs and imaging studies  CBC: Recent Labs  Lab 11/15/19 0443 11/15/19 0443 11/16/19 0348 11/16/19 0348 11/16/19 1412 11/16/19 1421 11/17/19 0425 11/18/19 0408 11/19/19 0404  WBC 8.8  --  10.3  --   --   --  10.9* 10.9* 10.9*  NEUTROABS  --   --   --   --   --   --   --  8.5*  --   HGB 12.2*   < > 12.3*   < > 13.9 13.6 12.3* 12.3* 12.2*  HCT 40.1   < > 40.8   < > 41.0 40.0 41.5 40.6 40.1  MCV 100.5*  --  101.5*  --   --   --  101.7* 101.2* 100.3*  PLT 223  --  235  --   --   --  237 213 238   < > = values in this interval not displayed.   Basic Metabolic Panel: Recent Labs  Lab 11/15/19 0443 11/15/19 0443 11/16/19 0348 11/16/19 0348 11/16/19 1412 11/16/19 1412 11/16/19 1421  11/16/19 2044 11/17/19 0425 11/18/19 0408 11/19/19 0404  NA 141   < > 144   < > 143  --  143  --  142 142 140  K 3.6   < > 3.6   < > 3.5   < > 3.4* 3.4* 4.5 4.6 4.1  CL 98  --  94*  --   --   --   --   --  95* 96* 94*  CO2 35*  --  40*  --   --   --   --   --  38* 38* 38*  GLUCOSE 97  --  105*  --   --   --   --   --  96 95 99  BUN 22  --  22  --   --   --   --   --  _0 CREATININE 0.98  --  0.92  --   --   --   --   --  0.88 0.87 0.89  CALCIUM 8.8*  --  8.5*  --   --   --   --   --  8.6* 8.6* 8.6*  MG  --   --  1.8  --   --   --   --  1.8 2.4 2.1  --   PHOS  --   --   --   --   --   --   --   --   --  3.4  --    < > = values in this interval not displayed.   GFR: Estimated Creatinine Clearance: 86.5 mL/min (by C-G formula based on SCr of 0.89 mg/dL). Liver Function Tests: Recent Labs  Lab 11/18/19 0408  AST 16  [name] 14  [name] 53  [name] 0.9  [name] 6.7  ALBUMIN 2.6*   No results for input(s): LIPASE, [name], [name], [name], [name], TROPONINI in the last 168 hours. BNP (last 3 results) No results for input(s): PROBNP in the last 8760 hours. HbA1C: No results for input(s): HGBA1C in the last 72 hours. CBG: No results for input(s): GLUCAP in the last 168 hours. Lipid Profile: No results for input(s): CHOL, HDL, LDLCALC, TRIG, CHOLHDL, LDLDIRECT in the last 72 hours. Thyroid Function Tests: No results for input(s): TSH, [name], [name], [name], THYROIDAB in the last 72 hours. Anemia Panel: Recent Labs    11/18/19 0408  VITAMINB12 288  FOLATE 7.8  FERRITIN 153  [name] 232*  IRON 31*  RETICCTPCT 1.5   Sepsis Labs: No results for input(s): PROCALCITON, LATICACIDVEN in the last 168 hours.  Recent Results (from the past 240 hour(s))  Respiratory Panel by RT  PCR (Flu A&B, Covid) - Nasopharyngeal Swab     Status: None   Collection Time: 11/13/19  8:40 PM   Specimen: Nasopharyngeal Swab  Result Value Ref Range Status   SARS Coronavirus 2 by RT PCR NEGATIVE NEGATIVE Final    Comment: (NOTE) SARS-CoV-2 target nucleic acids are NOT DETECTED. The SARS-CoV-2 RNA is generally detectable in upper respiratoy specimens during the acute phase of infection. The lowest concentration of SARS-CoV-2 viral copies this assay can detect is 131 copies/mL. A negative result does not preclude SARS-Cov-2 infection and should not be used as the sole basis for treatment or other patient management decisions. A negative result may occur with  improper specimen collection/handling, submission of specimen other than nasopharyngeal swab, presence of viral mutation(s) within the areas targeted by this assay, and inadequate number of viral copies (<131 copies/mL). A negative result must be combined with clinical observations, patient history, and epidemiological information. The expected result is Negative. Fact Sheet for Patients:  PinkCheek.be Fact Sheet for Healthcare Providers:  GravelBags.it This test is not yet ap proved or cleared by the [place] FDA and  has been authorized for detection and/or diagnosis of SARS-CoV-2 by FDA under an Emergency Use Authorization (EUA). This EUA will remain  in effect (meaning this test can be used) for the duration of the COVID-19 declaration under Section 564(b)(1) of the Act, 21 U.S.C. section 360bbb-3(b)(1), unless the authorization is terminated or revoked sooner.    Influenza A by PCR NEGATIVE NEGATIVE Final   Influenza B by PCR  NEGATIVE NEGATIVE Final    Comment: (NOTE) The Xpert Xpress SARS-CoV-2/FLU/RSV assay is intended as an aid in  the diagnosis of influenza from Nasopharyngeal swab specimens and  should not be used as a sole basis for treatment. Nasal  washings and  aspirates are unacceptable for Xpert Xpress SARS-CoV-2/FLU/RSV  testing. Fact Sheet for Patients: PinkCheek.be Fact Sheet for Healthcare Providers: GravelBags.it This test is not yet approved or cleared by the [place] FDA and  has been authorized for detection and/or diagnosis of SARS-CoV-2 by  FDA under an Emergency Use Authorization (EUA). This EUA will remain  in effect (meaning this test can be used) for the duration of the  Covid-19 declaration under Section 564(b)(1) of the Act, 21  U.S.C. section 360bbb-3(b)(1), unless the authorization is  terminated or revoked. Performed at [facility], [place] [address], [place], [place] [address]      RN Pressure Injury Documentation:     Estimated body mass index is 29.85 kg/m as calculated from the following:   Height as of this encounter: _0  (1.905 m).   Weight as of this encounter: 108.3 kg.  Malnutrition Type:    Radiology Studies: DG CHEST PORT 1 VIEW  Result Date: 11/18/2019 CLINICAL DATA:  Shortness of breath. EXAM: PORTABLE CHEST 1 VIEW COMPARISON:  04/06/2019 and CT chest 10/21/2018. FINDINGS: Trachea is midline. Heart is enlarged. Thoracic aorta is calcified. Calcified pleural plaques bilaterally. Interstitial prominence is seen diffusely, similar to the prior exam. No definite superimposed airspace consolidation or pleural fluid. IMPRESSION: 1. No acute findings. 2. Calcified pleural plaques with interstitial prominence, indicative of asbestosis, as on 10/21/2018. 3.  Aortic atherosclerosis (ICD10-I70.0). Electronically Signed   By: Lorin Picket M.D.   On: 11/18/2019 09:22    Scheduled Meds: . apixaban  5 mg Oral BID  . aspirin EC  81 mg Oral Daily  . atorvastatin  40 mg Oral q1800  . carvedilol  6.25 mg Oral BID WC  . furosemide  80 mg Intravenous BID  . sacubitril-valsartan  1 tablet Oral BID  . sodium chloride flush  3 mL  Intravenous Q12H  . sodium chloride flush  3 mL Intravenous Q12H  . sodium chloride flush  3 mL Intravenous Q12H  . sodium chloride flush  3 mL Intravenous Q12H   Continuous Infusions: . sodium chloride    . sodium chloride 10 mL/hr at 11/16/19 1610  . sodium chloride      LOS: 6 days   Domenic Polite, MD [facility]

## 2019-11-19 NOTE — Plan of Care (Signed)
  Problem: Education: Goal: Knowledge of General Education information will improve Description: Including pain rating scale, medication(s)/side effects and non-pharmacologic comfort measures Outcome: Progressing   Problem: Activity: Goal: Capacity to carry out activities will improve Outcome: Progressing   Problem: Cardiac: Goal: Ability to achieve and maintain adequate cardiopulmonary perfusion will improve Outcome: Progressing

## 2019-11-19 NOTE — Progress Notes (Signed)
It was reported that the pt had a run of SVT where the HR went up to the 150s. On assessment pt is asymptomatic and resting comfortably. MD aware.

## 2019-11-19 NOTE — Plan of Care (Signed)
  Problem: Activity: Goal: Capacity to carry out activities will improve Outcome: Progressing   Problem: Cardiovascular: Goal: Vascular access site(s) Level 0-1 will be maintained Outcome: Progressing   

## 2019-11-19 NOTE — Telephone Encounter (Signed)
New message   Per Elnita Maxwell. Setup a TOC visit with Dr. Geraldo Pitter on 12/02/19 at 8:45 am.

## 2019-11-20 DIAGNOSIS — J9611 Chronic respiratory failure with hypoxia: Secondary | ICD-10-CM

## 2019-11-20 DIAGNOSIS — Z7901 Long term (current) use of anticoagulants: Secondary | ICD-10-CM

## 2019-11-20 LAB — CBC
HCT: 39 % (ref 39.0–52.0)
Hemoglobin: 11.8 g/dL — ABNORMAL LOW (ref 13.0–17.0)
MCH: 30.6 pg (ref 26.0–34.0)
MCHC: 30.3 g/dL (ref 30.0–36.0)
MCV: 101 fL — ABNORMAL HIGH (ref 80.0–100.0)
Platelets: 237 10*3/uL (ref 150–400)
RBC: 3.86 MIL/uL — ABNORMAL LOW (ref 4.22–5.81)
RDW: 15.4 % (ref 11.5–15.5)
WBC: 9.1 10*3/uL (ref 4.0–10.5)
nRBC: 0 % (ref 0.0–0.2)

## 2019-11-20 LAB — BASIC METABOLIC PANEL
Anion gap: 7 (ref 5–15)
BUN: 16 mg/dL (ref 8–23)
CO2: 40 mmol/L — ABNORMAL HIGH (ref 22–32)
Calcium: 8.5 mg/dL — ABNORMAL LOW (ref 8.9–10.3)
Chloride: 93 mmol/L — ABNORMAL LOW (ref 98–111)
Creatinine, Ser: 0.83 mg/dL (ref 0.61–1.24)
GFR calc Af Amer: >60 mL/min (ref >60)
GFR calc non Af Amer: >60 mL/min (ref >60)
Glucose, Bld: 88 mg/dL (ref 70–99)
Potassium: 3.8 mmol/L (ref 3.5–5.1)
Sodium: 140 mmol/L (ref 135–145)

## 2019-11-20 MED ORDER — SACUBITRIL-VALSARTAN 24-26 MG PO TABS: 1.0000 | ORAL_TABLET | Freq: Two times a day (BID) | ORAL | 0 refills | Status: DC

## 2019-11-20 MED ORDER — ATORVASTATIN CALCIUM 40 MG PO TABS: 40.0000 mg | ORAL_TABLET | Freq: Every day | ORAL | 0 refills | Status: DC

## 2019-11-20 MED ORDER — FUROSEMIDE 40 MG PO TABS: 40.0000 mg | ORAL_TABLET | Freq: Two times a day (BID) | ORAL | 0 refills | Status: DC

## 2019-11-20 MED ORDER — APIXABAN 5 MG PO TABS: 5.0000 mg | ORAL_TABLET | Freq: Two times a day (BID) | ORAL | 0 refills | Status: DC

## 2019-11-20 MED ORDER — CARVEDILOL 6.25 MG PO TABS: 6.2500 mg | ORAL_TABLET | Freq: Two times a day (BID) | ORAL | 0 refills | Status: DC

## 2019-11-20 MED FILL — ELIQUIS 5 MG TABLET: 5 | 30 days supply | Qty: 60 | Fill #0

## 2019-11-20 MED FILL — CARVEDILOL 6.25 MG TABLET: 6.25 | 30 days supply | Qty: 60 | Fill #0

## 2019-11-20 MED FILL — ENTRESTO 24 MG-26 MG TABLET: 24-26 | 30 days supply | Qty: 60 | Fill #0

## 2019-11-20 MED FILL — ATORVASTATIN CALCIUM 40 MG: 40 | 30 days supply | Qty: 30 | Fill #0

## 2019-11-20 MED FILL — FUROSEMIDE 40 MG TABLET: 40 | 30 days supply | Qty: 60 | Fill #0

## 2019-11-20 NOTE — Discharge Summary (Addendum)
Physician Discharge Summary  [name] [id number] DOB: March 15, 1936 DOA: 11/13/2019  PCP: [name], MD  Admit date: 11/13/2019 Discharge date: 11/20/2019  Time spent: 45 minutes minutes  Recommendations for Outpatient Follow-up:  [facility] heart care in 1 to 2 weeks, please check electrolytes at follow-up Pulmonologist in [place] for severe interstitial lung disease, asbestosis PCP in 1 week, also recommend goals of care discussions, CODE STATUS etc.  Discharge Diagnoses:  Principal Problem:   NSTEMI (non-ST elevated myocardial infarction) ([place]) Ischemic cardiomyopathy Acute systolic CHF   ILD (interstitial lung disease) ([place])   Essential hypertension   Aortic aneurysm without rupture ([place])   Osteoarthritis   Benign prostatic hyperplasia   Acute on chronic combined systolic and diastolic CHF (congestive heart failure) ([place])   Atrial fibrillation with RVR ([place])   Chronic respiratory failure with hypoxia ([place])   Discharge Condition: Stable  Diet recommendation: Low-sodium, heart healthy  Filed Weights   11/18/19 0517 11/19/19 0529 11/20/19 0440  Weight: 111.9 kg 108.3 kg 108 kg    History of present illness:  Patient is an 84 year old Caucasian male, obese, with past medical history significant forchronic HFpEF, 4L O2-dependentpulmonary fibrosis andhypertension.  -Presented to [facility] emergency room with worsening dyspnea, lower extremity edema, elevated troponin and rapid A. Fib --On further work-up he was noted to have newly depressed LV systolic function of 30 to 35% down from 60 to 65% in June 2020, he was transferred to [facility] underwent right and left heart cath which noted severe two-vessel disease, in addition also felt to have a component of tachycardia related cardiomyopathy, -Hospital course notable for rapid A. fib and some atrial tachycardia  Hospital Course:   Non-ST elevation MI Acute systolic and diastolic CHF -Troponin at [facility]  trended up to 1.8 from 0.8 -He consulted underwent right and left heart cath which showed 90% stenosis of proximal RCA and 95% of proximal circumflex, subsequently had access issues and given lack of chest pain intervention was deferred, he was started on medical therapy for CHF as well as rhythm issues -Currently on aspirin Coreg and Lipitor -Improving with diuresis, he is -6 L -Appears close to euvolemic, changed to p.o. Lasix 40 mg twice daily and was started on Entresto this admission by cardiology -Clinically improved, he will be discharged home in a stable condition, cardiology follow-up in 2 weeks -Needs labs to check on his kidney function in a week  New onsetAtrial Flutterwith RVR/atrial fibrillation with RVR/atrial tachycardia -Heart rate is better controlled, but intermittently rapid.  -Was on a Cardizem drip initially, now on Coreg -Also started on Eliquis this admission -EP was consulted, he was given a dose of Tikosyn, subsequently held given worsening QTC, no AEDs recommended at this time -Follow-up with EP in 1 month  Chronic Hypoxemic Respiratory Failure due to ILD/Asbestosis: -Patient is on supplemental oxygen 4 L via nasal cannula per minute at baseline. -Stable, needs new pulmonologist at discharge, reports that his previous pulmonologist retired  Hypertension: -Continue to optimize -Antihypertensives as above with carvedilol, Lasix as well as Entresto  Osteoarthritis. -Optimize pain control.  Acute urinary retention, history of BPH:  -Continue foley catheter placed 2/11 -Foley catheter discontinued and has been able to urinate subsequently without issues  Macrocytic Anemia -Patient's hemoglobin/hematocrit is now 12.3/41.5 and MCV is 101.7 -Stable  Obesity -Estimated body mass index is 29.85 kg/m as calculated from the following:   Height as of this encounter: _0  (1.905 m).   Weight as of this encounter:  108.3 kg. -Weight Loss and Dietary  Counseling given    Discharge Exam: Vitals:   11/20/19 0536 11/20/19 0925  BP: 119/75 127/81  Pulse: 74 70  Resp:    Temp: 97.6 F (36.4 C)   SpO2: 96%     General: AAOx3 Cardiovascular: S1S2/RRR Respiratory: CTAB  Discharge Instructions   Discharge Instructions    Amb Referral to Cardiac Rehabilitation   Complete by: As directed    To [place]   Diagnosis: NSTEMI   After initial evaluation and assessments completed: Virtual Based Care may be provided alone or in conjunction with Phase 2 Cardiac Rehab based on patient barriers.: Yes   Amb referral to AFIB Clinic   Complete by: As directed    Diet - low sodium heart healthy   Complete by: As directed    Diet Carb Modified   Complete by: As directed    Increase activity slowly   Complete by: As directed      Allergies as of 11/20/2019      Reactions   Lasix [furosemide] Diarrhea, Other (See Comments)   Additionally, this made the patient become dehydrated (eventually)      Medication List    STOP taking these medications   amLODipine 2.5 MG tablet Commonly known as: NORVASC   diphenhydramine-acetaminophen 25-500 MG Tabs tablet Commonly known as: TYLENOL PM   losartan 50 MG tablet Commonly known as: COZAAR   naproxen sodium 220 MG tablet Commonly known as: ALEVE     TAKE these medications   acetaminophen 650 MG CR tablet Commonly known as: TYLENOL Take 650 mg by mouth every 8 (eight) hours as needed for pain (or headaches).   apixaban 5 MG Tabs tablet Commonly known as: ELIQUIS Take 1 tablet (5 mg total) by mouth 2 (two) times daily.   atorvastatin 40 MG tablet Commonly known as: LIPITOR Take 1 tablet (40 mg total) by mouth daily at 6 PM.   carvedilol 6.25 MG tablet Commonly known as: COREG Take 1 tablet (6.25 mg total) by mouth 2 (two) times daily with a meal.   fluticasone 50 MCG/ACT nasal spray Commonly known as: FLONASE Place 2 sprays into both nostrils daily as needed (for seasonal  allergies).   furosemide 40 MG tablet Commonly known as: LASIX Take 1 tablet (40 mg total) by mouth 2 (two) times daily. What changed:   medication strength  when to take this   ICaps Caps Take 1 capsule by mouth daily with breakfast.   Incruse Ellipta 62.5 MCG/INH Aepb Generic drug: umeclidinium bromide Inhale 1 puff into the lungs daily.   Krill Oil 350 MG Caps Take 350 mg by mouth daily with breakfast.   omeprazole 20 MG capsule Commonly known as: PRILOSEC Take 1 capsule (20 mg total) by mouth daily. What changed:   when to take this  reasons to take this   sacubitril-valsartan 24-26 MG Commonly known as: ENTRESTO Take 1 tablet by mouth 2 (two) times daily.            Durable Medical Equipment  (From admission, onward)         Start     Ordered   11/18/19 1708  For home use only DME Walker rolling  Once    Question Answer Comment  Walker: With 5 Inch Wheels   Patient needs a walker to treat with the following condition Weakness      11/18/19 1707   11/18/19 1650  For home use only DME 4 wheeled rolling walker  with seat  Once    Question:  Patient needs a walker to treat with the following condition  Answer:  Ambulatory dysfunction   11/18/19 1649         Allergies  Allergen Reactions  . Lasix [Furosemide] Diarrhea and Other (See Comments)    Additionally, this made the patient become dehydrated (eventually)   Follow-up Information    Shirley Friar, PA-C Follow up.   Specialty: Physician Assistant Why: 12/14/2019 @ 10:55AM Contact information: [address] [place] [address] [place] Follow up.   Specialty: Home Health Services Why: [facility] to be providing physical therapy and occupation therapy in the home.  You will be contacted by them in the next 24-48 hours of discharge home.  [facility]  phone number is  [phone]. Contact information: [address] [place] [address] [phone]        [facility] Follow up.   Why: You will receive rolling walker at the bedside before your discharge home.       pulmonary. Schedule an appointment as soon as possible for a visit in 1 month(s).            The results of significant diagnostics from this hospitalization (including imaging, microbiology, ancillary and laboratory) are listed below for reference.    Significant Diagnostic Studies: CARDIAC CATHETERIZATION  Result Date: 11/16/2019  Prox Cx lesion is 95% stenosed.  Prox RCA lesion is 90% stenosed.  Mid RCA lesion is 75% stenosed.  LV end diastolic pressure is mildly elevated.  Hemodynamic findings consistent with moderate pulmonary hypertension.  1. Severe 2 vessel obstructive CAD. This involves the proximal LCx and a Co dominant RCA. 2. LV filling pressures are mildly elevated 17-18 mm Hg 3. Moderate pulmonary HTN with mean PAP 39 mm Hg 4. Chronic respiratory failure with pCO2 of 67 and bicarb of 40. 5. Cardiac index 2.21. Plan: reviewed with Dr Irish Lack. Patient presented with predominant symptoms of SOB. No significant chest pain. 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 He has significant access issues for coronary intervention. It may be prudent at this time to maximize his CHF therapy and control his arrhythmia. If he fails to respond to these measures then PCI of the RCA and LCx could be considered. If so then I would consider access from a left radial approach. If femoral access is needed then would need to use a very long access sheath to deal with iliac tortuosity.   DG CHEST PORT 1 VIEW  Result Date: 11/18/2019 CLINICAL DATA:  Shortness of breath. EXAM: PORTABLE CHEST 1 VIEW COMPARISON:  04/06/2019 and CT chest 10/21/2018. FINDINGS: Trachea is midline. Heart is enlarged. Thoracic aorta is calcified. Calcified  pleural plaques bilaterally. Interstitial prominence is seen diffusely, similar to the prior exam. No definite superimposed airspace consolidation or pleural fluid. IMPRESSION: 1. No acute findings. 2. Calcified pleural plaques with interstitial prominence, indicative of asbestosis, as on 10/21/2018. 3.  Aortic atherosclerosis (ICD10-I70.0). Electronically Signed   By: Lorin Picket M.D.   On: 11/18/2019 09:22    Microbiology: Recent Results (from the past 240 hour(s))  Respiratory Panel by RT PCR (Flu A&B, Covid) - Nasopharyngeal Swab     Status: None   Collection Time: 11/13/19  8:40 PM   Specimen: Nasopharyngeal Swab  Result Value Ref Range Status  SARS Coronavirus 2 by RT PCR NEGATIVE NEGATIVE Final    Comment: (NOTE) SARS-CoV-2 target nucleic acids are NOT DETECTED. The SARS-CoV-2 RNA is generally detectable in upper respiratoy specimens during the acute phase of infection. The lowest concentration of SARS-CoV-2 viral copies this assay can detect is 131 copies/mL. A negative result does not preclude SARS-Cov-2 infection and should not be used as the sole basis for treatment or other patient management decisions. A negative result may occur with  improper specimen collection/handling, submission of specimen other than nasopharyngeal swab, presence of viral mutation(s) within the areas targeted by this assay, and inadequate number of viral copies (<131 copies/mL). A negative result must be combined with clinical observations, patient history, and epidemiological information. The expected result is Negative. Fact Sheet for Patients:  PinkCheek.be Fact Sheet for Healthcare Providers:  GravelBags.it This test is not yet ap proved or cleared by the [place] FDA and  has been authorized for detection and/or diagnosis of SARS-CoV-2 by FDA under an Emergency Use Authorization (EUA). This EUA will remain  in effect (meaning  this test can be used) for the duration of the COVID-19 declaration under Section 564(b)(1) of the Act, 21 U.S.C. section 360bbb-3(b)(1), unless the authorization is terminated or revoked sooner.    Influenza A by PCR NEGATIVE NEGATIVE Final   Influenza B by PCR NEGATIVE NEGATIVE Final    Comment: (NOTE) The Xpert Xpress SARS-CoV-2/FLU/RSV assay is intended as an aid in  the diagnosis of influenza from Nasopharyngeal swab specimens and  should not be used as a sole basis for treatment. Nasal washings and  aspirates are unacceptable for Xpert Xpress SARS-CoV-2/FLU/RSV  testing. Fact Sheet for Patients: PinkCheek.be Fact Sheet for Healthcare Providers: GravelBags.it This test is not yet approved or cleared by the [place] FDA and  has been authorized for detection and/or diagnosis of SARS-CoV-2 by  FDA under an Emergency Use Authorization (EUA). This EUA will remain  in effect (meaning this test can be used) for the duration of the  Covid-19 declaration under Section 564(b)(1) of the Act, 21  U.S.C. section 360bbb-3(b)(1), unless the authorization is  terminated or revoked. Performed at [facility], [place] [address], [place], [place] [address]      Labs: Basic Metabolic Panel: Recent Labs  Lab 11/16/19 0348 11/16/19 1412 11/16/19 1421 11/16/19 1421 11/16/19 2044 11/17/19 0425 11/18/19 0408 11/19/19 0404 11/20/19 0327  NA 144   < > 143  --   --  142 142 140 140  K 3.6   < > 3.4*   < > 3.4* 4.5 4.6 4.1 3.8  CL 94*  --   --   --   --  95* 96* 94* 93*  CO2 40*  --   --   --   --  38* 38* 38* 40*  GLUCOSE 105*  --   --   --   --  96 95 99 88  BUN 22  --   --   --   --  _0 CREATININE 0.92  --   --   --   --  0.88 0.87 0.89 0.83  CALCIUM 8.5*  --   --   --   --  8.6* 8.6* 8.6* 8.5*  MG 1.8  --   --   --  1.8 2.4 2.1  --   --   PHOS  --   --   --   --   --   --  3.4  --   --    < > = values in this  interval not displayed.   Liver Function Tests: Recent Labs  Lab 11/18/19 0408  AST 16  [name] 14  [name] 53  [name] 0.9  [name] 6.7  ALBUMIN 2.6*   No results for input(s): LIPASE, AMYLASE in the last 168 hours. No results for input(s): AMMONIA in the last 168 hours. CBC: Recent Labs  Lab 11/16/19 0348 11/16/19 1412 11/16/19 1421 11/17/19 0425 11/18/19 0408 11/19/19 0404 11/20/19 0327  WBC 10.3  --   --  10.9* 10.9* 10.9* 9.1  NEUTROABS  --   --   --   --  8.5*  --   --   HGB 12.3*   < > 13.6 12.3* 12.3* 12.2* 11.8*  HCT 40.8   < > 40.0 41.5 40.6 40.1 39.0  MCV 101.5*  --   --  101.7* 101.2* 100.3* 101.0*  PLT 235  --   --  237 213 238 237   < > = values in this interval not displayed.   Cardiac Enzymes: No results for input(s): CKTOTAL, [name], [name], TROPONINI in the last 168 hours. BNP: BNP (last 3 results) No results for input(s): BNP in the last 8760 hours.  ProBNP (last 3 results) No results for input(s): PROBNP in the last 8760 hours.  CBG: No results for input(s): GLUCAP in the last 168 hours.     Signed:  Domenic Polite MD.  [facility] 11/20/2019, 2:24 PM

## 2019-11-20 NOTE — Progress Notes (Addendum)
Progress Note  Patient Name: [name] Date of Encounter: 11/20/2019  Primary Cardiologist: William Pick Tobb, DO   Subjective   No acute overnight events. Breathing back to baseline. No chest pain, palpitations, lightheadedness, or dizziness. His only complaint this morning is mild pain on the dorsal surface of his left foot. However, he states lower extremity swelling is improved from baseline.   Inpatient Medications    Scheduled Meds: . apixaban  5 mg Oral BID  . aspirin EC  81 mg Oral Daily  . atorvastatin  40 mg Oral q1800  . carvedilol  6.25 mg Oral BID WC  . furosemide  40 mg Oral BID  . sacubitril-valsartan  1 tablet Oral BID  . sodium chloride flush  3 mL Intravenous Q12H  . sodium chloride flush  3 mL Intravenous Q12H  . sodium chloride flush  3 mL Intravenous Q12H  . sodium chloride flush  3 mL Intravenous Q12H   Continuous Infusions: . sodium chloride    . sodium chloride 10 mL/hr at 11/16/19 1610  . sodium chloride     PRN Meds: sodium chloride, sodium chloride, sodium chloride, acetaminophen, fluticasone, pantoprazole, sodium chloride flush, sodium chloride flush, sodium chloride flush   Vital Signs    Vitals:   11/19/19 1920 11/19/19 1928 11/20/19 0440 11/20/19 0536  BP:  121/86  119/75  Pulse:  67  74  Resp:  17    Temp:  98 F (36.7 C)  97.6 F (36.4 C)  TempSrc:  Oral  Oral  SpO2: 98% 97%  96%  Weight:   108 kg   Height:        Intake/Output Summary (Last 24 hours) at 11/20/2019 0705 Last data filed at 11/20/2019 0500 Gross per 24 hour  Intake 910 ml  Output 880 ml  Net 30 ml   Last 3 Weights 11/20/2019 11/19/2019 11/18/2019  Weight (lbs) 238 lb 1 oz 238 lb 12.8 oz 246 lb 11.1 oz  Weight (kg) 107.984 kg 108.319 kg 111.9 kg      Telemetry    Normal sinus rhythm with rates in the 60's to 70's with frequent brief spikes of sinus tachycardia/atrial tachycardia in the 110's - Personally Reviewed  ECG    No new ECG tracing today.  -  Personally Reviewed  Physical Exam   GEN: Elderly Caucasian male in no acute distress.   Neck: Supple. No JVD. Cardiac: RRR. II-III systolic murmur best heard at upper sternal border. No rubs or gallops. Right radial cath site soft with no signs of hematoma. Radial pulses 2+ and equal bilaterally. Respiratory: No increased worked of breathing. Mild crackles noted in bibasilar  but think this is likely due to interstitial lung disease and not volume overload. No wheezes or rhonchi. GI: Soft, non-distended, and non-tender. Bowel sounds present. MS:1+ pitting edema of bilateral lower extremities.  No deformity. Skin: Warm and dry. Neuro:  No focal deficits.  Psych: Normal affect. Responds appropriately.   Labs    High Sensitivity Troponin:   Recent Labs  Lab 11/13/19 1726 11/13/19 1849  TROPONINIHS 684* 571*      Chemistry Recent Labs  Lab 11/18/19 0408 11/19/19 0404 11/20/19 0327  NA 142 140 140  K 4.6 4.1 3.8  CL 96* 94* 93*  CO2 38* 38* 40*  GLUCOSE 95 99 88  BUN _0 CREATININE 0.87 0.89 0.83  CALCIUM 8.6* 8.6* 8.5*  [name] 6.7  --   --   ALBUMIN 2.6*  --   --  AST 16  --   --   [name] 14  --   --   [name] 53  --   --   [name] 0.9  --   --   GFRNONAA >60 >60 >60  GFRAA >60 >60 >60  ANIONGAP _0 Hematology Recent Labs  Lab 11/18/19 0408 11/19/19 0404 11/20/19 0327  WBC 10.9* 10.9* 9.1  RBC 4.01*  4.02* 4.00* 3.86*  HGB 12.3* 12.2* 11.8*  HCT 40.6 40.1 39.0  MCV 101.2* 100.3* 101.0*  MCH 30.7 30.5 30.6  MCHC 30.3 30.4 30.3  RDW 15.7* 15.4 15.4  PLT 213 238 237    BNPNo results for input(s): BNP, PROBNP in the last 168 hours.   DDimer No results for input(s): DDIMER in the last 168 hours.   Radiology    No results found.  Cardiac Studies   Right/Left Cardiac Catheterization 11/16/2019:   Prox Cx lesion is 95% stenosed.   Prox RCA lesion is 90% stenosed.   Mid RCA lesion is 75% stenosed.   LV end diastolic pressure is mildly  elevated.   Hemodynamic findings consistent with moderate pulmonary hypertension.    1. Severe 2 vessel obstructive CAD. This involves the proximal LCx and a Co dominant RCA.  2. LV filling pressures are mildly elevated 17-18 mm Hg  3. Moderate pulmonary HTN with mean PAP 39 mm Hg  4. Chronic respiratory failure with pCO2 of 67 and bicarb of 40.  5. Cardiac index 2.21.    Plan: Reviewed with Dr Irish Lack. Patient presented with predominant symptoms of SOB. No significant chest pain. 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 He has significant access issues for coronary intervention. It may be prudent at this time to maximize his CHF therapy and control his arrhythmia. If he fails to respond to these measures then PCI of the RCA and LCx could be considered. If so then I would consider access from a left radial approach. If femoral access is needed then would need to use a very long access sheath to deal with iliac tortuosity.    Patient Profile    [name] is a 84 y.o. male with a history of chronic diastolic heart failure, hypertension interstitial lung disease on home O2 of 4L, and obesity. He presented to [place] with lower extremity edema and was found to be in atrial fibrillation/flutter with RVR with an elevated troponin. He was transferred to [facility] for further management.   Assessment & Plan    NSTEMI  - Troponin at [facility] 0.8 >> 1.09 >> 1.56 >> 1.80  - Echo at [facility] showed worsening LV function.  - Right/Left Cardiac Catheterization on 2/15 showed 90% stenosis of proximal RCA and 95% stenosis of proximal CX.  - Given access issues and lack of chest pain as his presenting symptom, intervention was deferred. Plan for CHF medical therapy as well as rate and rhythm control.  - No angina.  - Continue Aspirin 65m daily, Coreg 6.253mtwice daily, and Lipitor 4060maily.    Atrial Fibrillation/Flutter with RVR  -  Cardizem drip was initially started and then switched to a beta-blocker given his EF of 30-35% [facility]  - Telemetry shows normal sinus rhythm with baseline rates in the 60's to 80's but still having frequent brief episodes of sinus tachycardia/atrial tachycardia with rates in the 110's. - EP consulted and Tikosyn started.  However, patient had markedly prolonged QTc with this so it was discontinued. Per EP note on 2/17, no additional antiarrhythmic at this time. They have arranged Zio  monitor and follow-up in their office.  - Continue Coreg 6.79m twice daily. - CHA2DS2-VASc = 5. Eliquis 576mtwice daily started this admission.    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  - Transitioned from IV to PO Lasix 4022mwice daily yesterday (was on 39m45mily at home). Documented urinary output of 880 L over the last 24 hours and net negative 5.2 L since admission. Weight 238 lbs today, down from 244 lbs on addmission. Renal function stable. - Continue current dose of Lasix.  - Continue Entresto 24-26mg36mce daily and Coreg 6.25mg 53me daily.  - Discussed importance of monitoring daily weights and fluid/sodium restriction after discharge. Will include information in discharge instructions.   Hypertension  - BP well controlled. - Continue medications as above.    Hyperlipidemia  - Lipid panel from 11/13/2019: Cholesterol 124; HDL 37; LDL Cholesterol 76; Triglycerides 54; VLDL 11  - LDL goal <70 given CAD.  - Continue Lipitor 39mg d107m.  Disposition: MD to see. Patient will likely be discharged today by primary team. Arranged follow-up with Dr. RevankaGeraldo Pitter/2021 which is the earliest available appointment in [place]. Patient will need repeat BMET in 1-2 weeks after starting Entresto.  Callie Darreld Mclean2/19/2021 7:05  AM Pager: [phone] examined the patient and reviewed assessment and plan and discussed with patient.  Agree with above as stated.  Feels well.  Breathing better.  COntinue Lasix 40 mg BID at home.  If he has volume retention, could increase to 80 mg BID.  OK to discharge from a cardiac standpoint.   JayadeeLarae Grooms questions or updates, please contact [facility] Please consult [email or web address] for contact info under

## 2019-11-20 NOTE — TOC Transition Note (Signed)
Transition of Care [facility]) - CM/SW Discharge Note   Patient Details  Name: [name] MRN: [id number] Date of Birth: Feb 05, 1936  Transition of Care [facility]) CM/SW Contact:  Curlene Labrum, RN Phone Number: 11/20/2019, 10:45 AM   Clinical Narrative:     Patient presented to [facility] 6 days ago for Acute on Chronic CHF and Non-STEMI.  Wife at bedside with the patient and patient to be discharged this morning.  No other new care needs identified before discharge.  Patient has rolling walker from [employer] present in the room.  Patient given physician's order for possible Rolator walker need to be filled at a later date by [facility] in [place].  Wife states she has already contacted the [place] for follow up visit.  Patient with no medication or transportation assistance. Wife is present and driving the patient home this morning.  Plan to follow up with [facility] at fax [phone] - plan to fax the discharge physician summary once completed in the chart.    Final next level of care: Home w Home Health Services Barriers to Discharge: No Barriers Identified  Readmission Risk Interventions No flowsheet data found.

## 2019-11-20 NOTE — Care Management Important Message (Signed)
Important Message  Patient Details  Name: [name] MRN: [id number] Date of Birth: 08-31-1936   Medicare Important Message Given:  Yes     [name] 11/20/2019, 10:19 AM

## 2019-11-20 NOTE — Progress Notes (Signed)
Discussed MI, HF, Afib, low sodium, daily wts, walking as tolerated, and CRPII with pt and wife. Receptive, good questions asked. Gave booklets. Will refer to [place]. He did pulmonary previously and greatly benefited.  [id number] [place], ACSM 11:25 AM 11/20/2019

## 2019-11-20 NOTE — Progress Notes (Signed)
Physical Therapy Treatment Patient Details Name: [name] MRN: [id number] DOB: [birth year] Today's Date: 11/20/2019    History of Present Illness Pt is a 84 y.o. M with significant PMH of chronic HFpEF, 4L O2 dependent pulmonary fibrosis, and HTN who was admitted with ventricular rate in 160s and new onset atrial flutter with RVR. ECG showed newly depressed LV systolic function LVEF 10-62%.     PT Comments    Patient received in bed, very pleasant and excited about DC, politely declines PT due to not having eaten yet (wife is bringing breakfast) and wanting to focus on DC hopefully around lunch time. Noted HR to be 120-131BPM in A-fib at rest.  He did have some questions- discussed PT opinion on lift chair (he is at too high a level to really benefit from it, needs to use muscle strength for mobility) and encouraged rollator to assist in progressing gait tolerance with HHPT as well. Also discussed and encouraged use of incentive spirometer and regular walks/activity with his wife and HHPT at home. He was left in bed with all needs met this morning.     Follow Up Recommendations  Home health PT;Supervision for mobility/OOB     Equipment Recommendations  None recommended by PT    Recommendations for Other Services       Precautions / Restrictions Precautions Precautions: Fall Precaution Comments: 4L O2 baseline Restrictions Weight Bearing Restrictions: No    Mobility  Bed Mobility               General bed mobility comments: declined OOB  Transfers                 General transfer comment: declined OOB  Ambulation/Gait             General Gait Details: declined OOB   Stairs             Wheelchair Mobility    Modified Rankin (Stroke Patients Only)       Balance                                            Cognition Arousal/Alertness: Awake/alert Behavior During Therapy: WFL for tasks assessed/performed Overall  Cognitive Status: Within Functional Limits for tasks assessed                                        Exercises      General Comments General comments (skin integrity, edema, etc.): declined OOB, reports he has not eaten yet and his wife is bringing him breakfast soon, wants to relax and focus on getting ready for DC      Pertinent Vitals/Pain Pain Assessment: Faces Pain Score: 0-No pain Pain Intervention(s): Limited activity within patient's tolerance;Monitored during session    Home Living                      Prior Function            PT Goals (current goals can now be found in the care plan section) Acute Rehab PT Goals Patient Stated Goal: to go home PT Goal Formulation: With patient Time For Goal Achievement: 11/29/19 Potential to Achieve Goals: Good Progress towards PT goals: Progressing toward goals    Frequency  Min 3X/week      PT Plan Current plan remains appropriate    Co-evaluation              AM-PAC PT "6 Clicks" Mobility   Outcome Measure  Help needed turning from your back to your side while in a flat bed without using bedrails?: None Help needed moving from lying on your back to sitting on the side of a flat bed without using bedrails?: A Little Help needed moving to and from a bed to a chair (including a wheelchair)?: A Little Help needed standing up from a chair using your arms (e.g., wheelchair or bedside chair)?: A Little Help needed to walk in hospital room?: A Little Help needed climbing 3-5 steps with a railing? : A Little 6 Click Score: 19    End of Session   Activity Tolerance: Patient tolerated treatment well Patient left: in bed;with call bell/phone within reach;with bed alarm set   PT Visit Diagnosis: Unsteadiness on feet (R26.81);Difficulty in walking, not elsewhere classified (R26.2)     Time: 0093-8182 PT Time Calculation (min) (ACUTE ONLY): 10 min  Charges:  $Self Care/Home Management:  8-22                     Windell Norfolk, DPT, PN1   Supplemental Physical Therapist [place]    Pager [phone] Acute Rehab Office [phone]

## 2019-11-20 NOTE — Telephone Encounter (Signed)
Called pt to make an appointment after dc from hospital.  Appointment was changed to a later time as requested by the pt for the same day.  Pt  Or his wife had no questions or concers regarding recent dc.

## 2019-11-23 ENCOUNTER — Ambulatory Visit (INDEPENDENT_AMBULATORY_CARE_PROVIDER_SITE_OTHER): Payer: Medicare Other

## 2019-11-23 DIAGNOSIS — I484 Atypical atrial flutter: Secondary | ICD-10-CM | POA: Diagnosis not present

## 2019-11-30 ENCOUNTER — Telehealth: Payer: Self-pay | Admitting: Cardiology

## 2019-11-30 NOTE — Telephone Encounter (Signed)
ok 

## 2019-11-30 NOTE — Telephone Encounter (Signed)
Will route to MD to advise on visitor policy, and if okay. Thank you!

## 2019-11-30 NOTE — Telephone Encounter (Signed)
Spoke with patient wife. Informed her DR Geraldo Pitter is OK with her coming to appointment.

## 2019-11-30 NOTE — Telephone Encounter (Signed)
   Pt's wife called requesting if she can come in with the pt on his appt on 12/02/19. She said that her husband couldn't hear well.  Please advise

## 2019-12-02 ENCOUNTER — Encounter: Payer: Self-pay | Admitting: Cardiology

## 2019-12-02 ENCOUNTER — Ambulatory Visit (INDEPENDENT_AMBULATORY_CARE_PROVIDER_SITE_OTHER): Payer: Medicare Other | Admitting: Cardiology

## 2019-12-02 ENCOUNTER — Other Ambulatory Visit: Payer: Self-pay

## 2019-12-02 ENCOUNTER — Ambulatory Visit: Payer: Medicare Other | Admitting: Cardiology

## 2019-12-02 VITALS — BP 134/68 | HR 59 | Ht 75.0 in | Wt 243.0 lb

## 2019-12-02 DIAGNOSIS — I509 Heart failure, unspecified: Secondary | ICD-10-CM | POA: Insufficient documentation

## 2019-12-02 DIAGNOSIS — I5043 Acute on chronic combined systolic (congestive) and diastolic (congestive) heart failure: Secondary | ICD-10-CM

## 2019-12-02 DIAGNOSIS — I429 Cardiomyopathy, unspecified: Secondary | ICD-10-CM | POA: Insufficient documentation

## 2019-12-02 DIAGNOSIS — I2510 Atherosclerotic heart disease of native coronary artery without angina pectoris: Secondary | ICD-10-CM

## 2019-12-02 DIAGNOSIS — I10 Essential (primary) hypertension: Secondary | ICD-10-CM | POA: Diagnosis not present

## 2019-12-02 DIAGNOSIS — I4891 Unspecified atrial fibrillation: Secondary | ICD-10-CM

## 2019-12-02 DIAGNOSIS — I5020 Unspecified systolic (congestive) heart failure: Secondary | ICD-10-CM

## 2019-12-02 HISTORY — DX: Heart failure, unspecified: I50.9

## 2019-12-02 HISTORY — DX: Cardiomyopathy, unspecified: I42.9

## 2019-12-02 HISTORY — DX: Atherosclerotic heart disease of native coronary artery without angina pectoris: I25.10

## 2019-12-02 MED ORDER — METOLAZONE 2.5 MG PO TABS: 2.5000 mg | ORAL_TABLET | Freq: Every day | ORAL | 6 refills | Status: DC

## 2019-12-02 NOTE — Patient Instructions (Signed)
Medication Instructions:  Your physician has recommended you make the following change in your medication:   Stop your Lasix and start Metalazone 2.5 mg daily.  *If you need a refill on your cardiac medications before your next appointment, please call your pharmacy*   Lab Work: You had a bmet today. If you have labs (blood work) drawn today and your tests are completely normal, you will receive your results only by: Marland Kitchen MyChart Message (if you have MyChart) OR . A paper copy in the mail If you have any lab test that is abnormal or we need to change your treatment, we will call you to review the results.   Testing/Procedures: None ordered   Follow-Up: At [facility], you and your health needs are our priority.  As part of our continuing mission to provide you with exceptional heart care, we have created designated Provider Care Teams.  These Care Teams include your primary Cardiologist (physician) and Advanced Practice Providers (APPs -  Physician Assistants and Nurse Practitioners) who all work together to provide you with the care you need, when you need it.  We recommend signing up for the patient portal called "MyChart".  Sign up information is provided on this After Visit Summary.  MyChart is used to connect with patients for Virtual Visits (Telemedicine).  Patients are able to view lab/test results, encounter notes, upcoming appointments, etc.  Non-urgent messages can be sent to your provider as well.   To learn more about what you can do with MyChart, go to [email or web address].    Your next appointment:   2 week(s)  The format for your next appointment:   In Person  Provider:   Jyl Heinz, MD   Other Instructions NA

## 2019-12-02 NOTE — Progress Notes (Signed)
Cardiology Office Note:    Date:  12/02/2019   ID:  [name], DOB [birth year], MRN [id number]  PCP:  [name], MD  Cardiologist:  Jenean Lindau, MD   Referring MD: [name], MD    ASSESSMENT:    1. Essential hypertension   2. Acute on chronic combined systolic and diastolic CHF (congestive heart failure) ([place])   3. Atrial fibrillation with RVR ([place])   4. Coronary artery disease involving native coronary artery of native heart without angina pectoris   5. Cardiomyopathy, unspecified type ([place])   6. Systolic congestive heart failure, unspecified HF chronicity ([place])    PLAN:    In order of problems listed above:  1. Coronary artery disease: Secondary prevention stressed with the patient.  Importance of compliance with diet and medication stressed and he vocalized understanding.  Coronary angiography report was discussed with him at extensive length and I agree with medical management at this time. 2. Cardiomyopathy and congestive heart failure: I discussed diet with him.  Salt intake issues were discussed.  He weighs himself on a regular basis.  I will change his furosemide to metolazone 2.5 mg daily.  He will have a Chem-7 today.  I will see him in follow-up appointment in 2 weeks. 3. Atrial fibrillation:I discussed with the patient atrial fibrillation, disease process. Management and therapy including rate and rhythm control, anticoagulation benefits and potential risks were discussed extensively with the patient. Patient had multiple questions which were answered to patient's satisfaction. 4. I will get him an appointment with our electrophysiology colleagues to see if there is any possibility of rhythm issues and management such as cardioversion.  It could help the patient in terms of his pedal edema and congestive heart failure issues. 5. Again follow-up appointment in 2 weeks to titrate his medications and follow-up on the interventions done today.  Patient and wife  had multiple questions which were answered to their satisfaction.   Medication Adjustments/Labs and Tests Ordered: Current medicines are reviewed at length with the patient today.  Concerns regarding medicines are outlined above.  Orders Placed This Encounter  Procedures  . Basic Metabolic Panel (BMET)  . Ambulatory referral to Cardiac Electrophysiology   Meds ordered this encounter  Medications  . metolazone (ZAROXOLYN) 2.5 MG tablet    Sig: Take 1 tablet (2.5 mg total) by mouth daily.    Dispense:  30 tablet    Refill:  6     Chief Complaint  Patient presents with  . Hospitalization Follow-up     History of Present Illness:    [name] is a 84 y.o. male.  Patient has past medical history of recently diagnosed coronary artery disease.  Coronary angiography details are discussed below.  He has history of atrial fibrillation, essential hypertension and cardiomyopathy.  He denies any problems at this time and takes care of activities of daily living.  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  He has multiple stenosis in his coronary arteries but this was thought to be a\is a complex intervention and therefore medical management was recommended.  Patient mentions to me that his feet swelling is of significant concern today.  He has no chest pain orthopnea or PND.  At the time of my evaluation, the patient is alert awake oriented and in no distress.Marland Kitchen  His wife accompanies him for this visit and is very supportive.  Past Medical History:  Diagnosis Date  .  AAA (abdominal aortic aneurysm) ([place])   . Allergic rhinitis   . BPH (benign prostatic hypertrophy)   . Colon polyp   . Hypertension   . Osteoarthritis     Past Surgical History:  Procedure Laterality Date  . CATARACT EXTRACTION, BILATERAL    . COLONOSCOPY    . HERNIA REPAIR     as child  . REPLACEMENT TOTAL KNEE Left   . RIGHT/LEFT HEART CATH AND CORONARY ANGIOGRAPHY  N/A 11/16/2019   Procedure: RIGHT/LEFT HEART CATH AND CORONARY ANGIOGRAPHY;  Surgeon: Martinique, Peter M, MD;  Location: [facility];  Service: Cardiovascular;  Laterality: N/A;  . TONSILLECTOMY      Current Medications: Current Meds  Medication Sig  . acetaminophen (TYLENOL) 650 MG CR tablet Take 650 mg by mouth every 8 (eight) hours as needed for pain (or headaches).   Marland Kitchen apixaban (ELIQUIS) 5 MG TABS tablet Take 1 tablet (5 mg total) by mouth 2 (two) times daily.  Marland Kitchen atorvastatin (LIPITOR) 40 MG tablet Take 1 tablet (40 mg total) by mouth daily at 6 PM.  . carvedilol (COREG) 6.25 MG tablet Take 1 tablet (6.25 mg total) by mouth 2 (two) times daily with a meal.  . fluticasone (FLONASE) 50 MCG/ACT nasal spray Place 2 sprays into both nostrils daily as needed (for seasonal allergies).   . INCRUSE ELLIPTA 62.5 MCG/INH AEPB Inhale 1 puff into the lungs daily.  Javier Docker Oil 350 MG CAPS Take 350 mg by mouth daily with breakfast.   . Multiple Vitamins-Minerals (ICAPS) CAPS Take 1 capsule by mouth daily with breakfast.  . omeprazole (PRILOSEC) 20 MG capsule Take 1 capsule (20 mg total) by mouth daily. (Patient taking differently: Take 20 mg by mouth daily as needed (for GERD-like symptoms). )  . sacubitril-valsartan (ENTRESTO) 24-26 MG Take 1 tablet by mouth 2 (two) times daily.  . [DISCONTINUED] furosemide (LASIX) 40 MG tablet Take 1 tablet (40 mg total) by mouth 2 (two) times daily.     Allergies:   Lasix [furosemide]   Social History   Socioeconomic History  . Marital status: Married    Spouse name: Not on file  . Number of children: 2  . Years of education: Not on file  . Highest education level: Not on file  Occupational History  . Occupation: retired  Tobacco Use  . Smoking status: Former Smoker    Packs/day: 1.00    Years: 10.00    Pack years: 10.00    Types: Cigarettes, Pipe, Cigars    Start date: 10/02/1955    Quit date: 10/01/1965    Years since quitting: 54.2  . Smokeless  tobacco: Former Systems developer    Types: Chew  Substance and Sexual Activity  . Alcohol use: No  . Drug use: No  . Sexual activity: Not on file  Other Topics Concern  . Not on file  Social History Narrative   Originally from [place]. Has always lived in [place]. Served in [employer] and has traveled aboard ship extensively. 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 Has asbestos exposure through his work for 3.5 years from 1957-1961. 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 No mold or bird exposure. Enjoys golfing.    Social Determinants of Health   Financial Resource Strain:   . Difficulty of Paying Living Expenses: Not on file  Food Insecurity:   . Worried About Charity fundraiser in the  Last Year: Not on file  . Ran Out of Food in the Last Year: Not on file  Transportation Needs:   . Lack of Transportation (Medical): Not on file  . Lack of Transportation (Non-Medical): Not on file  Physical Activity:   . Days of Exercise per Week: Not on file  . Minutes of Exercise per Session: Not on file  Stress:   . Feeling of Stress : Not on file  Social Connections:   . Frequency of Communication with Friends and Family: Not on file  . Frequency of Social Gatherings with Friends and Family: Not on file  . Attends Religious Services: Not on file  . Active Member of Clubs or Organizations: Not on file  . Attends Archivist Meetings: Not on file  . Marital Status: Not on file     Family History: The patient's family history includes Heart disease in his mother; Leukemia in his father. There is no history of Lung disease or Rheumatologic disease.  ROS:   Please see the history of present illness.    All other systems reviewed and are negative.  EKGs/Labs/Other Studies Reviewed:    The following studies were reviewed today: Martinique, Peter M, MD Study date: 11/16/19  MyChart Results Release  MyChart Status: Active Results  Release  Physicians  Panel Physicians Referring Physician Case Authorizing Physician  Martinique, Peter M, MD (Primary)    Procedures  RIGHT/LEFT HEART CATH AND CORONARY ANGIOGRAPHY  Conclusion    Prox Cx lesion is 95% stenosed.  Prox RCA lesion is 90% stenosed.  Mid RCA lesion is 75% stenosed.  LV end diastolic pressure is mildly elevated.  Hemodynamic findings consistent with moderate pulmonary hypertension.   1. Severe 2 vessel obstructive CAD. This involves the proximal LCx and a Co dominant RCA.  2. LV filling pressures are mildly elevated 17-18 mm Hg 3. Moderate pulmonary HTN with mean PAP 39 mm Hg 4. Chronic respiratory failure with pCO2 of 67 and bicarb of 40. 5. Cardiac index 2.21.   Plan: reviewed with Dr Irish Lack. Patient presented with predominant symptoms of SOB. No significant chest pain. 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 He has significant access issues for coronary intervention. It may be prudent at this time to maximize his CHF therapy and control his arrhythmia. If he fails to respond to these measures then PCI of the RCA and LCx could be considered. If so then I would consider access from a left radial approach. If femoral access is needed then would need to use a very long access sheath to deal with iliac tortuosity.       Recent Labs: 11/18/2019: [name] 14; Magnesium 2.1 11/20/2019: BUN 16; Creatinine, Ser 0.83; Hemoglobin 11.8; Platelets 237; Potassium 3.8; Sodium 140  Recent Lipid Panel    Component Value Date/Time   CHOL 124 11/13/2019 1726   TRIG 54 11/13/2019 1726   HDL 37 (L) 11/13/2019 1726   CHOLHDL 3.4 11/13/2019 1726   VLDL 11 11/13/2019 1726   LDLCALC 76 11/13/2019 1726    Physical Exam:    VS:  BP 134/68   Pulse (!) 59   Ht _0  (1.905 m)   Wt 243 lb (110.2 kg)   SpO2 92%   BMI 30.37 kg/m     Wt Readings from Last 3 Encounters:  12/02/19 243 lb (110.2 kg)    11/20/19 238 lb 1 oz (108 kg)  04/06/19 245 lb 12.8 oz (  111.5 kg)     GEN: Patient is in no acute distress HEENT: Normal NECK: No JVD; No carotid bruits LYMPHATICS: No lymphadenopathy CARDIAC: Hear sounds regular, 2/6 systolic murmur at the apex. RESPIRATORY:  Clear to auscultation without rales, wheezing or rhonchi  ABDOMEN: Soft, non-tender, non-distended MUSCULOSKELETAL:  No edema; No deformity  SKIN: Warm and dry NEUROLOGIC:  Alert and oriented x 3 PSYCHIATRIC:  Normal affect   Signed, Jenean Lindau, MD  12/02/2019 11:59 AM    [place]

## 2019-12-03 ENCOUNTER — Other Ambulatory Visit: Payer: Self-pay | Admitting: Nurse Practitioner

## 2019-12-03 LAB — BASIC METABOLIC PANEL
BUN/Creatinine Ratio: 23 (ref 10–24)
BUN: 16 mg/dL (ref 8–27)
CO2: 29 mmol/L (ref 20–29)
Calcium: 9 mg/dL (ref 8.6–10.2)
Chloride: 100 mmol/L (ref 96–106)
Creatinine, Ser: 0.71 mg/dL — ABNORMAL LOW (ref 0.76–1.27)
GFR calc Af Amer: 102 mL/min/{1.73_m2} (ref >59)
GFR calc non Af Amer: 88 mL/min/{1.73_m2} (ref >59)
Glucose: 126 mg/dL — ABNORMAL HIGH (ref 65–99)
Potassium: 4.2 mmol/L (ref 3.5–5.2)
Sodium: 146 mmol/L — ABNORMAL HIGH (ref 134–144)

## 2019-12-07 ENCOUNTER — Ambulatory Visit (INDEPENDENT_AMBULATORY_CARE_PROVIDER_SITE_OTHER): Payer: Medicare Other | Admitting: Cardiology

## 2019-12-07 ENCOUNTER — Encounter: Payer: Self-pay | Admitting: Cardiology

## 2019-12-07 ENCOUNTER — Other Ambulatory Visit: Payer: Self-pay

## 2019-12-07 VITALS — BP 124/78 | HR 84 | Ht 75.0 in | Wt 237.0 lb

## 2019-12-07 DIAGNOSIS — I4891 Unspecified atrial fibrillation: Secondary | ICD-10-CM

## 2019-12-07 NOTE — Progress Notes (Signed)
Electrophysiology Office Note   Date:  12/07/2019   ID:  [name], DOB 1936/08/20, MRN [id number]  PCP:  [name], MD  Cardiologist:  Revankar Primary Electrophysiologist:  Barak Bialecki Meredith Leeds, MD    Chief Complaint: AF   History of Present Illness: [name] is a 84 y.o. male who is being seen today for the evaluation of AF at the request of Sunny Schlein Revankar. Presenting today for electrophysiology evaluation.  He has a history significant for COPD, asbestosis, hypertension, diastolic heart failure, atrial fibrillation, and coronary artery disease.  He had a left heart catheterization that showed significant two-vessel disease.  Today, he denies symptoms of palpitations, chest pain, shortness of breath, orthopnea, PND, lower extremity edema, claudication, dizziness, presyncope, syncope, bleeding, or neurologic sequela. The patient is tolerating medications without difficulties.  Overall he is feeling at about his baseline.  He continues to be on high doses of oxygen.  He is unaware of arrhythmias at this time.   Past Medical History:  Diagnosis Date  . AAA (abdominal aortic aneurysm) ([place])   . Allergic rhinitis   . BPH (benign prostatic hypertrophy)   . Colon polyp   . Hypertension   . Osteoarthritis    Past Surgical History:  Procedure Laterality Date  . CATARACT EXTRACTION, BILATERAL    . COLONOSCOPY    . HERNIA REPAIR     as child  . REPLACEMENT TOTAL KNEE Left   . RIGHT/LEFT HEART CATH AND CORONARY ANGIOGRAPHY N/A 11/16/2019   Procedure: RIGHT/LEFT HEART CATH AND CORONARY ANGIOGRAPHY;  Surgeon: Martinique, Peter M, MD;  Location: [facility];  Service: Cardiovascular;  Laterality: N/A;  . TONSILLECTOMY       Current Outpatient Medications  Medication Sig Dispense Refill  . acetaminophen (TYLENOL) 650 MG CR tablet Take 650 mg by mouth every 8 (eight) hours as needed for pain (or headaches).     Marland Kitchen apixaban (ELIQUIS) 5 MG TABS tablet Take 1 tablet (5 mg  total) by mouth 2 (two) times daily. 60 tablet 0  . atorvastatin (LIPITOR) 40 MG tablet Take 1 tablet (40 mg total) by mouth daily at 6 PM. 30 tablet 0  . carvedilol (COREG) 6.25 MG tablet Take 1 tablet (6.25 mg total) by mouth 2 (two) times daily with a meal. 60 tablet 0  . fluticasone (FLONASE) 50 MCG/ACT nasal spray Place 2 sprays into both nostrils daily as needed (for seasonal allergies).     . INCRUSE ELLIPTA 62.5 MCG/INH AEPB Inhale 1 puff into the lungs daily.    Javier Docker Oil 350 MG CAPS Take 350 mg by mouth daily with breakfast.     . metolazone (ZAROXOLYN) 2.5 MG tablet Take 1 tablet (2.5 mg total) by mouth daily. 30 tablet 6  . Multiple Vitamins-Minerals (ICAPS) CAPS Take 1 capsule by mouth daily with breakfast.    . omeprazole (PRILOSEC) 20 MG capsule Take 1 capsule (20 mg total) by mouth daily. 90 capsule 2  . sacubitril-valsartan (ENTRESTO) 24-26 MG Take 1 tablet by mouth 2 (two) times daily. 60 tablet 0   No current facility-administered medications for this visit.    Allergies:   Lasix [furosemide]   Social History:  The patient  reports that he quit smoking about 54 years ago. His smoking use included cigarettes, pipe, and cigars. He started smoking about 64 years ago. He has a 10.00 pack-year smoking history. He has quit using smokeless tobacco.  His smokeless tobacco use included chew. He reports that  he does not drink alcohol or use drugs.   Family History:  The patient's family history includes Heart disease in his mother; Leukemia in his father.    ROS:  Please see the history of present illness.   Otherwise, review of systems is positive for none.   All other systems are reviewed and negative.    PHYSICAL EXAM: VS:  BP 124/78   Pulse 84   Ht _0  (1.905 m)   Wt 237 lb (107.5 kg)   SpO2 (!) 87%   BMI 29.62 kg/m  , BMI Body mass index is 29.62 kg/m. GEN: Well nourished, well developed, in no acute distress  HEENT: normal  Neck: no JVD, carotid bruits, or  masses Cardiac: iRRR; no murmurs, rubs, or gallops,no edema  Respiratory:  clear to auscultation bilaterally, normal work of breathing GI: soft, nontender, nondistended, + BS MS: no deformity or atrophy  Skin: warm and dry Neuro:  Strength and sensation are intact Psych: euthymic mood, full affect  EKG:  EKG is not ordered today. Personal review of the ekg ordered 11/18/19 shows sinus rhythm  Recent Labs: 11/18/2019: [name] 14; Magnesium 2.1 11/20/2019: Hemoglobin 11.8; Platelets 237 12/02/2019: BUN 16; Creatinine, Ser 0.71; Potassium 4.2; Sodium 146    Lipid Panel     Component Value Date/Time   CHOL 124 11/13/2019 1726   TRIG 54 11/13/2019 1726   HDL 37 (L) 11/13/2019 1726   CHOLHDL 3.4 11/13/2019 1726   VLDL 11 11/13/2019 1726   LDLCALC 76 11/13/2019 1726     Wt Readings from Last 3 Encounters:  12/07/19 237 lb (107.5 kg)  12/02/19 243 lb (110.2 kg)  11/20/19 238 lb 1 oz (108 kg)      Other studies Reviewed: Additional studies/ records that were reviewed today include: TTE 03/24/19  Review of the above records today demonstrates:  1. The left ventricle has normal systolic function with an ejection  fraction of 60-65%. The cavity size was normal. Left ventricular diastolic  Doppler parameters are indeterminate. No evidence of left ventricular  regional wall motion abnormalities.  2. The right ventricle has normal systolic function. The cavity was  normal. There is no increase in right ventricular wall thickness.  3. The mitral valve is abnormal. Mild thickening of the mitral valve  leaflet.  4. The aortic valve is abnormal. Mild calcification of the aortic valve.  Aortic valve regurgitation is trivial by color flow Doppler. Mild stenosis  of the aortic valve.  5. The interatrial septum was not well visualized.  RHC/LHC 11/16/19 1. Severe 2 vessel obstructive CAD. This involves the proximal LCx and a Co dominant RCA.  2. LV filling pressures are mildly elevated  17-18 mm Hg 3. Moderate pulmonary HTN with mean PAP 39 mm Hg 4. Chronic respiratory failure with pCO2 of 67 and bicarb of 40. 5. Cardiac index 2.21.   ASSESSMENT AND PLAN:  1.  Atrial flutter/fibrillation: Currently on Eliquis.  He did not tolerate dofetilide due to prolongation of the QT.  Vascular 5.  His heart rhythm is irregular today, though his rate is well controlled.  He is currently wearing cardiac monitor.  We Meesha Sek check the results of his monitor for instituting any medical management.  2.  Coronary artery disease: Two-vessel disease.  Plan per primary cardiology.  Case discussed with primary cardiology  Current medicines are reviewed at length with the patient today.   The patient does not have concerns regarding his medicines.  The following changes were made  today:  none  Labs/ tests ordered today include:  No orders of the defined types were placed in this encounter.    Disposition:   FU with Subrina Vecchiarelli 6 months  Signed, Velma Agnes Meredith Leeds, MD  12/07/2019 12:15 PM     [address] [place] [address] [phone] (office) [phone] (fax)

## 2019-12-07 NOTE — Patient Instructions (Signed)
Medication Instructions:  Your physician recommends that you continue on your current medications as directed. Please refer to the Current Medication list given to you today.  *If you need a refill on your cardiac medications before your next appointment, please call your pharmacy*   Lab Work: None ordered If you have labs (blood work) drawn today and your tests are completely normal, you will receive your results only by: Marland Kitchen MyChart Message (if you have MyChart) OR . A paper copy in the mail If you have any lab test that is abnormal or we need to change your treatment, we will call you to review the results.   Testing/Procedures: None ordered   Follow-Up: At [facility], you and your health needs are our priority.  As part of our continuing mission to provide you with exceptional heart care, we have created designated Provider Care Teams.  These Care Teams include your primary Cardiologist (physician) and Advanced Practice Providers (APPs -  Physician Assistants and Nurse Practitioners) who all work together to provide you with the care you need, when you need it.  We recommend signing up for the patient portal called "MyChart".  Sign up information is provided on this After Visit Summary.  MyChart is used to connect with patients for Virtual Visits (Telemedicine).  Patients are able to view lab/test results, encounter notes, upcoming appointments, etc.  Non-urgent messages can be sent to your provider as well.   To learn more about what you can do with MyChart, go to [email or web address].    Your next appointment:   6 month(s)  The format for your next appointment:   In Person  Provider:   Allegra Lai, MD   Thank you for choosing [facility]!!   Trinidad Curet, RN [phone]

## 2019-12-14 ENCOUNTER — Ambulatory Visit: Payer: Medicare Other | Admitting: Student

## 2019-12-16 ENCOUNTER — Ambulatory Visit: Payer: Medicare Other | Admitting: Cardiology

## 2019-12-17 ENCOUNTER — Other Ambulatory Visit: Payer: Self-pay

## 2019-12-17 MED ORDER — ATORVASTATIN CALCIUM 40 MG PO TABS: 40.0000 mg | ORAL_TABLET | Freq: Every day | ORAL | 0 refills | Status: DC

## 2019-12-17 MED ORDER — APIXABAN 5 MG PO TABS: 5.0000 mg | ORAL_TABLET | Freq: Two times a day (BID) | ORAL | 0 refills | Status: DC

## 2019-12-17 MED ORDER — CARVEDILOL 6.25 MG PO TABS: 6.2500 mg | ORAL_TABLET | Freq: Two times a day (BID) | ORAL | 0 refills | Status: DC

## 2019-12-17 MED ORDER — SACUBITRIL-VALSARTAN 24-26 MG PO TABS: 1.0000 | ORAL_TABLET | Freq: Two times a day (BID) | ORAL | 0 refills | Status: DC

## 2019-12-21 DIAGNOSIS — N400 Enlarged prostate without lower urinary tract symptoms: Secondary | ICD-10-CM | POA: Diagnosis not present

## 2019-12-21 DIAGNOSIS — I959 Hypotension, unspecified: Secondary | ICD-10-CM | POA: Diagnosis not present

## 2019-12-21 DIAGNOSIS — I5020 Unspecified systolic (congestive) heart failure: Secondary | ICD-10-CM

## 2019-12-21 DIAGNOSIS — R55 Syncope and collapse: Secondary | ICD-10-CM | POA: Diagnosis not present

## 2019-12-22 DIAGNOSIS — N400 Enlarged prostate without lower urinary tract symptoms: Secondary | ICD-10-CM | POA: Diagnosis not present

## 2019-12-22 DIAGNOSIS — R55 Syncope and collapse: Secondary | ICD-10-CM | POA: Diagnosis not present

## 2019-12-22 DIAGNOSIS — I959 Hypotension, unspecified: Secondary | ICD-10-CM | POA: Diagnosis not present

## 2019-12-22 DIAGNOSIS — I5020 Unspecified systolic (congestive) heart failure: Secondary | ICD-10-CM | POA: Diagnosis not present

## 2019-12-24 ENCOUNTER — Encounter: Payer: Self-pay | Admitting: Cardiology

## 2019-12-24 ENCOUNTER — Other Ambulatory Visit: Payer: Self-pay

## 2019-12-24 ENCOUNTER — Ambulatory Visit: Payer: Medicare Other | Admitting: Cardiology

## 2019-12-24 VITALS — BP 100/50 | HR 96 | Ht 75.0 in | Wt 233.0 lb

## 2019-12-24 DIAGNOSIS — I4891 Unspecified atrial fibrillation: Secondary | ICD-10-CM

## 2019-12-24 DIAGNOSIS — I10 Essential (primary) hypertension: Secondary | ICD-10-CM | POA: Diagnosis not present

## 2019-12-24 DIAGNOSIS — I2510 Atherosclerotic heart disease of native coronary artery without angina pectoris: Secondary | ICD-10-CM | POA: Diagnosis not present

## 2019-12-24 DIAGNOSIS — I429 Cardiomyopathy, unspecified: Secondary | ICD-10-CM

## 2019-12-24 NOTE — Progress Notes (Signed)
Cardiology Office Note:    Date:  12/24/2019   ID:  [name], DOB [birth year], MRN [id number]  PCP:  [name], MD  Cardiologist:  Jenean Lindau, MD   Referring MD: [name], MD    ASSESSMENT:    1. Essential hypertension   2. Atrial fibrillation with RVR ([place])   3. Coronary artery disease involving native coronary artery of native heart without angina pectoris   4. Cardiomyopathy, unspecified type ([place])    PLAN:    In order of problems listed above:  1. Paroxysmal atrial fibrillation:I discussed with the patient atrial fibrillation, disease process. Management and therapy including rate and rhythm control, anticoagulation benefits and potential risks were discussed extensively with the patient. Patient had multiple questions which were answered to patient's satisfaction.  I reviewed the notes of our electrophysiology colleagues and discussed findings with the patient. 2. Postural hypotension: Patient has been on Entresto in the past for low ejection fraction.  His ejection fraction is normalized.  I would prefer not to get him off the Entresto but at this time his blood pressure is so low that he has issues with postural hypotension and I suspect he may have a fall.  He appears frail.  For this reason I will hold his Delene Loll and see how he does.  We will have a Chem-7 done today.  Benefits and potential is explained and he vocalized understanding. 3. History of cardiomyopathy: Normal ejection fraction at this time. 4. Coronary artery disease: I discussed findings with the patient at length medical management will be pursued. 5. He will be seen for follow-up appointment 2 to 3 weeks or earlier if he has any concerns.  He knows to go to the nearest emergency room for any concerning symptoms.   Medication Adjustments/Labs and Tests Ordered: Current medicines are reviewed at length with the patient today.  Concerns regarding medicines are outlined above.  No orders of  the defined types were placed in this encounter.  No orders of the defined types were placed in this encounter.    Chief Complaint  Patient presents with  . Follow-up    3 Weeks     History of Present Illness:    [name] is a 84 y.o. male.  Patient has past medical history of cardiomyopathy and atrial fibrillation.  He is on anticoagulation.  He denies any problems except symptoms suggesting of postural hypotension.  He was initiated on metolazone because of significant pedal edema which affected his quality of life.  Currently his symptoms are of postural hypotension.  He is up edema edema has gotten better.  The past couple of evaluations of ejection fraction are fine.  At the time of my evaluation, the patient is alert awake oriented and in no distress.  Past Medical History:  Diagnosis Date  . AAA (abdominal aortic aneurysm) ([place])   . Allergic rhinitis   . BPH (benign prostatic hypertrophy)   . Colon polyp   . Hypertension   . Osteoarthritis     Past Surgical History:  Procedure Laterality Date  . CATARACT EXTRACTION, BILATERAL    . COLONOSCOPY    . HERNIA REPAIR     as child  . REPLACEMENT TOTAL KNEE Left   . RIGHT/LEFT HEART CATH AND CORONARY ANGIOGRAPHY N/A 11/16/2019   Procedure: RIGHT/LEFT HEART CATH AND CORONARY ANGIOGRAPHY;  Surgeon: Martinique, Peter M, MD;  Location: [place] [facility];  Service: Cardiovascular;  Laterality: N/A;  . TONSILLECTOMY  Current Medications: Current Meds  Medication Sig  . acetaminophen (TYLENOL) 650 MG CR tablet Take 650 mg by mouth every 8 (eight) hours as needed for pain (or headaches).   Marland Kitchen apixaban (ELIQUIS) 5 MG TABS tablet Take 1 tablet (5 mg total) by mouth 2 (two) times daily.  Marland Kitchen atorvastatin (LIPITOR) 40 MG tablet Take 1 tablet (40 mg total) by mouth daily at 6 PM.  . carvedilol (COREG) 6.25 MG tablet Take 1 tablet (6.25 mg total) by mouth 2 (two) times daily with a meal.  . fluticasone (FLONASE) 50 MCG/ACT nasal spray  Place 2 sprays into both nostrils daily as needed (for seasonal allergies).   . INCRUSE ELLIPTA 62.5 MCG/INH AEPB Inhale 1 puff into the lungs daily.  Javier Docker Oil 350 MG CAPS Take 350 mg by mouth daily with breakfast.   . metolazone (ZAROXOLYN) 2.5 MG tablet Take 1 tablet (2.5 mg total) by mouth daily.  . Multiple Vitamins-Minerals (ICAPS) CAPS Take 1 capsule by mouth daily with breakfast.  . omeprazole (PRILOSEC) 20 MG capsule TAKE 1 CAPSULE BY MOUTH  DAILY  . sacubitril-valsartan (ENTRESTO) 24-26 MG Take 1 tablet by mouth 2 (two) times daily.  . tamsulosin (FLOMAX) 0.4 MG CAPS capsule Take 0.4 mg by mouth daily.     Allergies:   Lasix [furosemide]   Social History   Socioeconomic History  . Marital status: Married    Spouse name: Not on file  . Number of children: 2  . Years of education: Not on file  . Highest education level: Not on file  Occupational History  . Occupation: retired  Tobacco Use  . Smoking status: Former Smoker    Packs/day: 1.00    Years: 10.00    Pack years: 10.00    Types: Cigarettes, Pipe, Cigars    Start date: 10/02/1955    Quit date: 10/01/1965    Years since quitting: 54.2  . Smokeless tobacco: Former Systems developer    Types: Chew  Substance and Sexual Activity  . Alcohol use: No  . Drug use: No  . Sexual activity: Not on file  Other Topics Concern  . Not on file  Social History Narrative   Originally from [place]. Has always lived in [place]. Served in [employer] and has traveled aboard ship extensively. 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 Has asbestos exposure through his work for 3.5 years from 1957-1961. 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 No mold or bird exposure. Enjoys golfing.    Social Determinants of Health   Financial Resource Strain:   . Difficulty of Paying Living Expenses:   Food Insecurity:   . Worried About Charity fundraiser in the Last Year:   . Arboriculturist in the  Last Year:   Transportation Needs:   . Film/video editor (Medical):   Marland Kitchen Lack of Transportation (Non-Medical):   Physical Activity:   . Days of Exercise per Week:   . Minutes of Exercise per Session:   Stress:   . Feeling of Stress :   Social Connections:   . Frequency of Communication with Friends and Family:   . Frequency of Social Gatherings with Friends and Family:   . Attends Religious Services:   . Active Member of Clubs or Organizations:   . Attends Archivist Meetings:   Marland Kitchen Marital Status:      Family History: The patient's family  history includes Heart disease in his mother; Leukemia in his father. There is no history of Lung disease or Rheumatologic disease.  ROS:   Please see the history of present illness.    All other systems reviewed and are negative.  EKGs/Labs/Other Studies Reviewed:    The following studies were reviewed today: RIGHT/LEFT HEART CATH AND CORONARY ANGIOGRAPHY  Conclusion    Prox Cx lesion is 95% stenosed.  Prox RCA lesion is 90% stenosed.  Mid RCA lesion is 75% stenosed.  LV end diastolic pressure is mildly elevated.  Hemodynamic findings consistent with moderate pulmonary hypertension.   1. Severe 2 vessel obstructive CAD. This involves the proximal LCx and a Co dominant RCA.  2. LV filling pressures are mildly elevated 17-18 mm Hg 3. Moderate pulmonary HTN with mean PAP 39 mm Hg 4. Chronic respiratory failure with pCO2 of 67 and bicarb of 40. 5. Cardiac index 2.21.   Plan: reviewed with Dr Irish Lack. Patient presented with predominant symptoms of SOB. No significant chest pain. 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 He has significant access issues for coronary intervention. It may be prudent at this time to maximize his CHF therapy and control his arrhythmia. If he fails to respond to these measures then PCI of the RCA and LCx could be considered. If so  then I would consider access from a left radial approach. If femoral access is needed then would need to use a very long access sheath to deal with iliac tortuosity.    Study Highlights  Max 200 bpm 10:08am, 03/06 Min 61 bpm 04:21am, 02/26 Avg 78 bpm 7.7% PACs <1% PVCs Multiple atrial runs, fastest at 200 bpm lasting 14 beats, longest at 123 BPM lasting 23 minutes Triggered event associated with sinus rhythm  Will Curt Bears, MD      Recent Labs: 11/18/2019: [name] 14; Magnesium 2.1 11/20/2019: Hemoglobin 11.8; Platelets 237 12/02/2019: BUN 16; Creatinine, Ser 0.71; Potassium 4.2; Sodium 146  Recent Lipid Panel    Component Value Date/Time   CHOL 124 11/13/2019 1726   TRIG 54 11/13/2019 1726   HDL 37 (L) 11/13/2019 1726   CHOLHDL 3.4 11/13/2019 1726   VLDL 11 11/13/2019 1726   LDLCALC 76 11/13/2019 1726    Physical Exam:    VS:  BP (!) 100/50   Pulse 96   Ht 6' 3" (1.905 m)   Wt 233 lb (105.7 kg)   SpO2 91% Comment: 4 Liters of Oxygen  BMI 29.12 kg/m     Wt Readings from Last 3 Encounters:  12/24/19 233 lb (105.7 kg)  12/07/19 237 lb (107.5 kg)  12/02/19 243 lb (110.2 kg)     GEN: Patient is in no acute distress HEENT: Normal NECK: No JVD; No carotid bruits LYMPHATICS: No lymphadenopathy CARDIAC: Hear sounds regular, 2/6 systolic murmur at the apex. RESPIRATORY:  Clear to auscultation without rales, wheezing or rhonchi  ABDOMEN: Soft, non-tender, non-distended MUSCULOSKELETAL:  No edema; No deformity  SKIN: Warm and dry NEUROLOGIC:  Alert and oriented x 3 PSYCHIATRIC:  Normal affect   Signed, Jenean Lindau, MD  12/24/2019 11:48 AM    [place]

## 2019-12-24 NOTE — Patient Instructions (Signed)
Medication Instructions:  Your physician has recommended you make the following change in your medication:   Stop Entresto.  *If you need a refill on your cardiac medications before your next appointment, please call your pharmacy*   Lab Work: You had a BMET today. If you have labs (blood work) drawn today and your tests are completely normal, you will receive your results only by: Marland Kitchen MyChart Message (if you have MyChart) OR . A paper copy in the mail If you have any lab test that is abnormal or we need to change your treatment, we will call you to review the results.   Testing/Procedures: None ordered   Follow-Up: At [facility], you and your health needs are our priority.  As part of our continuing mission to provide you with exceptional heart care, we have created designated Provider Care Teams.  These Care Teams include your primary Cardiologist (physician) and Advanced Practice Providers (APPs -  Physician Assistants and Nurse Practitioners) who all work together to provide you with the care you need, when you need it.  We recommend signing up for the patient portal called "MyChart".  Sign up information is provided on this After Visit Summary.  MyChart is used to connect with patients for Virtual Visits (Telemedicine).  Patients are able to view lab/test results, encounter notes, upcoming appointments, etc.  Non-urgent messages can be sent to your provider as well.   To learn more about what you can do with MyChart, go to [email or web address].    Your next appointment:   1 month(s)  The format for your next appointment:   In Person  Provider:   Jyl Heinz, MD   Other Instructions   Have a great day!!

## 2019-12-25 LAB — BASIC METABOLIC PANEL
BUN/Creatinine Ratio: 31 — ABNORMAL HIGH (ref 10–24)
BUN: 30 mg/dL — ABNORMAL HIGH (ref 8–27)
CO2: 31 mmol/L — ABNORMAL HIGH (ref 20–29)
Calcium: 8.9 mg/dL (ref 8.6–10.2)
Chloride: 95 mmol/L — ABNORMAL LOW (ref 96–106)
Creatinine, Ser: 0.96 mg/dL (ref 0.76–1.27)
GFR calc Af Amer: 85 mL/min/{1.73_m2} (ref >59)
GFR calc non Af Amer: 73 mL/min/{1.73_m2} (ref >59)
Glucose: 116 mg/dL — ABNORMAL HIGH (ref 65–99)
Potassium: 4.1 mmol/L (ref 3.5–5.2)
Sodium: 140 mmol/L (ref 134–144)

## 2020-01-19 ENCOUNTER — Ambulatory Visit: Payer: Medicare Other | Admitting: Cardiology

## 2020-01-19 ENCOUNTER — Other Ambulatory Visit: Payer: Self-pay

## 2020-01-19 VITALS — BP 114/66 | HR 86 | Temp 97.9°F | Ht 75.0 in | Wt 237.2 lb

## 2020-01-19 DIAGNOSIS — I712 Thoracic aortic aneurysm, without rupture, unspecified: Secondary | ICD-10-CM

## 2020-01-19 DIAGNOSIS — I2510 Atherosclerotic heart disease of native coronary artery without angina pectoris: Secondary | ICD-10-CM | POA: Diagnosis not present

## 2020-01-19 DIAGNOSIS — I5020 Unspecified systolic (congestive) heart failure: Secondary | ICD-10-CM

## 2020-01-19 DIAGNOSIS — I4891 Unspecified atrial fibrillation: Secondary | ICD-10-CM | POA: Diagnosis not present

## 2020-01-19 DIAGNOSIS — I429 Cardiomyopathy, unspecified: Secondary | ICD-10-CM | POA: Diagnosis not present

## 2020-01-19 DIAGNOSIS — I10 Essential (primary) hypertension: Secondary | ICD-10-CM

## 2020-01-19 LAB — BASIC METABOLIC PANEL
BUN/Creatinine Ratio: 24 (ref 10–24)
BUN: 19 mg/dL (ref 8–27)
CO2: 28 mmol/L (ref 20–29)
Calcium: 9 mg/dL (ref 8.6–10.2)
Chloride: 95 mmol/L — ABNORMAL LOW (ref 96–106)
Creatinine, Ser: 0.78 mg/dL (ref 0.76–1.27)
GFR calc Af Amer: 97 mL/min/{1.73_m2} (ref >59)
GFR calc non Af Amer: 84 mL/min/{1.73_m2} (ref >59)
Glucose: 120 mg/dL — ABNORMAL HIGH (ref 65–99)
Potassium: 4 mmol/L (ref 3.5–5.2)
Sodium: 139 mmol/L (ref 134–144)

## 2020-01-19 NOTE — Progress Notes (Signed)
Cardiology Office Note:    Date:  01/19/2020   ID:  [name], DOB 14-Dec-1935, MRN [id number]  PCP:  William Manes, MD  Cardiologist:  William Lindau, MD   Referring MD: William Manes, MD    ASSESSMENT:    1. Thoracic aortic aneurysm without rupture ([place])   2. Atrial fibrillation with RVR ([place])   3. Coronary artery disease involving native coronary artery of native heart without angina pectoris   4. Cardiomyopathy, unspecified type ([place])   5. Systolic congestive heart failure, unspecified HF chronicity ([place])   6. Essential hypertension    PLAN:    In order of problems listed above:  1. Coronary artery disease: Secondary prevention stressed with the patient.  Importance of compliance with diet medication stressed and he vocalized understanding.  He is very happy with the fact that his blood pressure is fine and is feeling a little more energy and walking around.  He is is using walker for ambulation. 2. Essential hypertension: Blood pressure stable.  He is off on the [place] because of borderline blood pressure and hypotension.  He is tolerating metolazone well.  He will have a Chem-7 today. 3. I reviewed blood work from primary care physician done recently. 4. Atrial fibrillation:I discussed with the patient atrial fibrillation, disease process. Management and therapy including rate and rhythm control, anticoagulation benefits and potential risks were discussed extensively with the patient. Patient had multiple questions which were answered to patient's satisfaction. 5. Patient will be seen in follow-up appointment in 6 months or earlier if the patient has any concerns    Medication Adjustments/Labs and Tests Ordered: Current medicines are reviewed at length with the patient today.  Concerns regarding medicines are outlined above.  No orders of the defined types were placed in this encounter.  No orders of the defined types were placed in this encounter.    No chief  complaint on file.    History of Present Illness:    [name] is a 84 y.o. male.  Patient has past medical history of coronary artery disease and cardiomyopathy.  She he was on Entresto but his blood pressure was very borderline and he was feeling dizzy and lightheaded and so it was discontinued.  Currently is on metolazone.  His significant pedal edema and it is gotten better with metolazone but not completely gone.  His blood pressure is now borderline.  At the time of my evaluation, the patient is alert awake oriented and in no distress.  He uses oxygen all the time.  Past Medical History:  Diagnosis Date  . AAA (abdominal aortic aneurysm) ([place])   . Acute on chronic combined systolic and diastolic CHF (congestive heart failure) ([place])   . Allergic rhinitis   . Aortic aneurysm without rupture ([place]) 04/26/2016  . Atrial fibrillation with RVR ([place])   . Benign prostatic hyperplasia 04/26/2016  . BPH (benign prostatic hypertrophy)   . CAD (coronary artery disease) 12/02/2019  . Cardiomyopathy ([place]) 12/02/2019  . CHF (congestive heart failure) ([place]) 12/02/2019  . Chronic allergic rhinitis 04/26/2016  . Chronic respiratory failure with hypoxia ([place])   . Colon polyp   . Dyspnea 09/12/2016  . Emphysema of lung ([place]) 04/26/2016  . Essential hypertension 04/26/2016  . GERD (gastroesophageal reflux disease) 06/13/2016  . H/O asbestos exposure 04/26/2016  . Heart murmur 04/26/2016  . Hypertension   . ILD (interstitial lung disease) ([place]) 04/26/2016  . NSTEMI (non-ST elevated myocardial infarction) ([place])   . Osteoarthritis   .  Respiratory failure ([place]) 06/13/2016  . Restrictive airway disease 06/13/2016    Past Surgical History:  Procedure Laterality Date  . CATARACT EXTRACTION, BILATERAL    . COLONOSCOPY    . HERNIA REPAIR     as child  . REPLACEMENT TOTAL KNEE Left   . RIGHT/LEFT HEART CATH AND CORONARY ANGIOGRAPHY N/A 11/16/2019   Procedure: RIGHT/LEFT HEART CATH AND CORONARY ANGIOGRAPHY;   Surgeon: Pollard, William M, MD;  Location: [facility];  Service: Cardiovascular;  Laterality: N/A;  . TONSILLECTOMY      Current Medications: Current Meds  Medication Sig  . acetaminophen (TYLENOL) 650 MG CR tablet Take 650 mg by mouth every 8 (eight) hours as needed for pain (or headaches).   Marland Kitchen apixaban (ELIQUIS) 5 MG TABS tablet Take 1 tablet (5 mg total) by mouth 2 (two) times daily.  Marland Kitchen atorvastatin (LIPITOR) 40 MG tablet Take 1 tablet (40 mg total) by mouth daily at 6 PM.  . carvedilol (COREG) 6.25 MG tablet Take 1 tablet (6.25 mg total) by mouth 2 (two) times daily with a meal.  . fluticasone (FLONASE) 50 MCG/ACT nasal spray Place 2 sprays into both nostrils daily as needed (for seasonal allergies).   . INCRUSE ELLIPTA 62.5 MCG/INH AEPB Inhale 1 puff into the lungs daily.  Javier Docker Oil 350 MG CAPS Take 350 mg by mouth daily with breakfast.   . metolazone (ZAROXOLYN) 2.5 MG tablet Take 1 tablet (2.5 mg total) by mouth daily.  . Multiple Vitamins-Minerals (ICAPS) CAPS Take 1 capsule by mouth daily with breakfast.  . omeprazole (PRILOSEC) 20 MG capsule TAKE 1 CAPSULE BY MOUTH  DAILY  . tamsulosin (FLOMAX) 0.4 MG CAPS capsule Take 0.4 mg by mouth daily.     Allergies:   Lasix [furosemide]   Social History   Socioeconomic History  . Marital status: Married    Spouse name: Not on file  . Number of children: 2  . Years of education: Not on file  . Highest education level: Not on file  Occupational History  . Occupation: retired  Tobacco Use  . Smoking status: Former Smoker    Packs/day: 1.00    Years: 10.00    Pack years: 10.00    Types: Cigarettes, Pipe, Cigars    Start date: 10/02/1955    Quit date: 10/01/1965    Years since quitting: 54.3  . Smokeless tobacco: Former Systems developer    Types: Chew  Substance and Sexual Activity  . Alcohol use: No  . Drug use: No  . Sexual activity: Not on file  Other Topics Concern  . Not on file  Social History Narrative   Originally from  [place]. Has always lived in [place]. Served in [employer] and has traveled aboard ship extensively. 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 Has asbestos exposure through his work for 3.5 years from 1957-1961. 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 No mold or bird exposure. Enjoys golfing.    Social Determinants of Health   Financial Resource Strain:   . Difficulty of Paying Living Expenses:   Food Insecurity:   . Worried About Charity fundraiser in the Last Year:   . Arboriculturist in the Last Year:   Transportation Needs:   . Film/video editor (Medical):   Marland Kitchen Lack of Transportation (Non-Medical):   Physical Activity:   . Days of Exercise per Week:   .  Minutes of Exercise per Session:   Stress:   . Feeling of Stress :   Social Connections:   . Frequency of Communication with Friends and Family:   . Frequency of Social Gatherings with Friends and Family:   . Attends Religious Services:   . Active Member of Clubs or Organizations:   . Attends Archivist Meetings:   Marland Kitchen Marital Status:      Family History: The patient's family history includes Heart disease in his mother; Leukemia in his father. There is no history of Lung disease or Rheumatologic disease.  ROS:   Please see the history of present illness.    All other systems reviewed and are negative.  EKGs/Labs/Other Studies Reviewed:    The following studies were reviewed today: Pollard, William M, MD (Primary)    Procedures  RIGHT/LEFT HEART CATH AND CORONARY ANGIOGRAPHY  Conclusion    Prox Cx lesion is 95% stenosed.  Prox RCA lesion is 90% stenosed.  Mid RCA lesion is 75% stenosed.  LV end diastolic pressure is mildly elevated.  Hemodynamic findings consistent with moderate pulmonary hypertension.   1. Severe 2 vessel obstructive CAD. This involves the proximal LCx and a Co dominant RCA.  2. LV filling pressures are mildly elevated  17-18 mm Hg 3. Moderate pulmonary HTN with mean PAP 39 mm Hg 4. Chronic respiratory failure with pCO2 of 67 and bicarb of 40. 5. Cardiac index 2.21.   Plan: reviewed with Dr Irish Lack. Patient presented with predominant symptoms of SOB. No significant chest pain. 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 He has significant access issues for coronary intervention. It may be prudent at this time to maximize his CHF therapy and control his arrhythmia. If he fails to respond to these measures then PCI of the RCA and LCx could be considered. If so then I would consider access from a left radial approach. If femoral access is needed then would need to use a very long access sheath to deal with iliac tortuosity.

## 2020-01-19 NOTE — Patient Instructions (Signed)
Medication Instructions:  No medication changes *If you need a refill on your cardiac medications before your next appointment, please call your pharmacy*   Lab Work: Your physician recommends that you have a BMET today in the office.  If you have labs (blood work) drawn today and your tests are completely normal, you will receive your results only by: Marland Kitchen MyChart Message (if you have MyChart) OR . A paper copy in the mail If you have any lab test that is abnormal or we need to change your treatment, we will call you to review the results.   Testing/Procedures: None ordered   Follow-Up: At [facility], you and your health needs are our priority.  As part of our continuing mission to provide you with exceptional heart care, we have created designated Provider Care Teams.  These Care Teams include your primary Cardiologist (physician) and Advanced Practice Providers (APPs -  Physician Assistants and Nurse Practitioners) who all work together to provide you with the care you need, when you need it.  We recommend signing up for the patient portal called "MyChart".  Sign up information is provided on this After Visit Summary.  MyChart is used to connect with patients for Virtual Visits (Telemedicine).  Patients are able to view lab/test results, encounter notes, upcoming appointments, etc.  Non-urgent messages can be sent to your provider as well.   To learn more about what you can do with MyChart, go to [email or web address].    Your next appointment:   6 month(s)  The format for your next appointment:   In Person  Provider:   Jyl Heinz, MD   Other Instructions NA

## 2020-02-17 DIAGNOSIS — I10 Essential (primary) hypertension: Secondary | ICD-10-CM

## 2020-02-19 NOTE — Addendum Note (Signed)
Addended by: Truddie Hidden on: 02/19/2020 01:52 PM   Modules accepted: Orders

## 2020-03-19 ENCOUNTER — Other Ambulatory Visit: Payer: Self-pay | Admitting: Cardiology

## 2020-03-19 MED ORDER — APIXABAN 5 MG PO TABS: 5.0000 mg | ORAL_TABLET | Freq: Two times a day (BID) | ORAL | 0 refills | Status: DC

## 2020-03-19 MED ORDER — ATORVASTATIN CALCIUM 40 MG PO TABS: 40.0000 mg | ORAL_TABLET | Freq: Every day | ORAL | 0 refills | Status: DC

## 2020-03-19 MED ORDER — CARVEDILOL 6.25 MG PO TABS: 6.2500 mg | ORAL_TABLET | Freq: Two times a day (BID) | ORAL | 0 refills | Status: DC

## 2020-03-21 ENCOUNTER — Other Ambulatory Visit: Payer: Self-pay

## 2020-03-21 MED ORDER — METOLAZONE 2.5 MG PO TABS: 2.5000 mg | ORAL_TABLET | Freq: Every day | ORAL | 2 refills | Status: DC

## 2020-03-21 MED ORDER — APIXABAN 5 MG PO TABS: 5.0000 mg | ORAL_TABLET | Freq: Two times a day (BID) | ORAL | 2 refills | Status: DC

## 2020-03-21 MED ORDER — ATORVASTATIN CALCIUM 40 MG PO TABS: 40.0000 mg | ORAL_TABLET | Freq: Every day | ORAL | 2 refills | Status: DC

## 2020-03-21 MED ORDER — CARVEDILOL 6.25 MG PO TABS: 6.2500 mg | ORAL_TABLET | Freq: Two times a day (BID) | ORAL | 2 refills | Status: DC

## 2020-03-24 NOTE — Telephone Encounter (Signed)
How do you advise?

## 2020-03-29 ENCOUNTER — Other Ambulatory Visit: Payer: Self-pay

## 2020-03-29 MED ORDER — ATORVASTATIN CALCIUM 40 MG PO TABS: 40.0000 mg | ORAL_TABLET | Freq: Every day | ORAL | 2 refills | Status: AC

## 2020-03-29 MED ORDER — CARVEDILOL 6.25 MG PO TABS: 6.2500 mg | ORAL_TABLET | Freq: Two times a day (BID) | ORAL | 2 refills | Status: DC

## 2020-03-29 MED ORDER — APIXABAN 5 MG PO TABS: 5.0000 mg | ORAL_TABLET | Freq: Two times a day (BID) | ORAL | 2 refills | Status: AC

## 2020-04-01 DIAGNOSIS — I471 Supraventricular tachycardia: Secondary | ICD-10-CM

## 2020-04-06 LAB — LIPID PANEL
Chol/HDL Ratio: 2.6 ratio (ref 0.0–5.0)
Cholesterol, Total: 103 mg/dL (ref 100–199)
HDL: 40 mg/dL (ref >39)
LDL Chol Calc (NIH): 44 mg/dL (ref 0–99)
Triglycerides: 100 mg/dL (ref 0–149)
VLDL Cholesterol Cal: 19 mg/dL (ref 5–40)

## 2020-04-06 LAB — HEPATIC FUNCTION PANEL
ALT: 10 IU/L (ref 0–44)
AST: 15 IU/L (ref 0–40)
Albumin: 3.3 g/dL — ABNORMAL LOW (ref 3.6–4.6)
Alkaline Phosphatase: 81 IU/L (ref 48–121)
Bilirubin Total: 0.3 mg/dL (ref 0.0–1.2)
Bilirubin, Direct: 0.17 mg/dL (ref 0.00–0.40)
Total Protein: 7.5 g/dL (ref 6.0–8.5)

## 2020-04-06 LAB — BASIC METABOLIC PANEL
BUN/Creatinine Ratio: 22 (ref 10–24)
BUN: 17 mg/dL (ref 8–27)
CO2: 31 mmol/L — ABNORMAL HIGH (ref 20–29)
Calcium: 8.8 mg/dL (ref 8.6–10.2)
Chloride: 96 mmol/L (ref 96–106)
Creatinine, Ser: 0.79 mg/dL (ref 0.76–1.27)
GFR calc Af Amer: 97 mL/min/{1.73_m2} (ref >59)
GFR calc non Af Amer: 84 mL/min/{1.73_m2} (ref >59)
Glucose: 93 mg/dL (ref 65–99)
Potassium: 4 mmol/L (ref 3.5–5.2)
Sodium: 141 mmol/L (ref 134–144)

## 2020-04-06 LAB — TSH: TSH: 2.02 u[IU]/mL (ref 0.450–4.500)

## 2020-09-02 ENCOUNTER — Other Ambulatory Visit: Payer: Self-pay

## 2020-09-02 ENCOUNTER — Ambulatory Visit: Payer: Medicare Other | Admitting: Pulmonary Disease

## 2020-09-02 ENCOUNTER — Encounter: Payer: Self-pay | Admitting: Pulmonary Disease

## 2020-09-02 VITALS — BP 142/70 | HR 103 | Temp 98.0°F | Ht 75.0 in | Wt 233.6 lb

## 2020-09-02 DIAGNOSIS — J849 Interstitial pulmonary disease, unspecified: Secondary | ICD-10-CM

## 2020-09-02 NOTE — Progress Notes (Signed)
[name]    [id number]    15-Apr-1936  Primary Care Physician:Stoneking, Christiane Ha, MD  Referring Physician: [name], MD 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 Previously been conservatively managed by Dr. Ashok Cordia in the pulmonary clinic.  Chief complaint is chronic dyspnea on exertion, cough with white mucus. Denies any fevers, chills  He developed a Covid-like illness in March 2020. Never got tested, stayed at home and did not seek medical help. He has significant dyspnea since then, got started on supplemental oxygen by his primary care.  Hospitalized in February 2021 with non-ST elevation MI, atrial fibrillation ischemic cardiomyopathy with EF of 35% underwent heart catheterization, stents could not be placed and hence being medically managed by Dr. Geraldo Pitter  Evaluated in the past by rheumatologist for possible rheumatoid arthritis but not found to have any evidence of connective tissue disease. He does not remember the name of the rheumatologist.  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 No mold, hot tub, Jacuzzi. No feather pillows or comforters Smoking history: 10-pack-year smoker. Quit in 1965 Travel history: No significant travel history Relevant family history: No significant family issue of lung disease  Outpatient Encounter Medications as of 09/02/2020  Medication Sig  . acetaminophen (TYLENOL) 650 MG CR tablet Take 650 mg by mouth every 8 (eight) hours as needed for pain (or headaches).   Marland Kitchen apixaban (ELIQUIS) 5 MG TABS tablet Take 1 tablet (5 mg total) by mouth 2 (two) times daily.  Marland Kitchen atorvastatin (LIPITOR) 40 MG tablet Take 1 tablet (40 mg total) by mouth daily at 6 PM.  . fluticasone (FLONASE)  50 MCG/ACT nasal spray Place 2 sprays into both nostrils daily as needed (for seasonal allergies).   Javier Docker Oil 350 MG CAPS Take 350 mg by mouth daily with breakfast.   . Multiple Vitamins-Minerals (ICAPS) CAPS Take 1 capsule by mouth daily with breakfast.  . metolazone (ZAROXOLYN) 2.5 MG tablet Take 1 tablet (2.5 mg total) by mouth daily.  . [DISCONTINUED] carvedilol (COREG) 6.25 MG tablet Take 1 tablet (6.25 mg total) by mouth 2 (two) times daily with a meal.  . [DISCONTINUED] INCRUSE ELLIPTA 62.5 MCG/INH AEPB Inhale 1 puff into the lungs daily.  . [DISCONTINUED] omeprazole (PRILOSEC) 20 MG capsule TAKE 1 CAPSULE BY MOUTH  DAILY  . [DISCONTINUED] tamsulosin (FLOMAX) 0.4 MG CAPS capsule Take 0.4 mg by mouth daily.   No facility-administered encounter medications on file as of 09/02/2020.    Allergies as of 09/02/2020 - Review Complete 09/02/2020  Allergen Reaction Noted  . Lasix [furosemide] Diarrhea and Other (See Comments) 11/14/2019    Past Medical History:  Diagnosis Date  . AAA (abdominal aortic aneurysm) ([place])   . Acute on chronic combined systolic and diastolic CHF (congestive heart failure) ([place])   . Allergic rhinitis   . Aortic aneurysm without rupture ([place]) 04/26/2016  . Atrial fibrillation with RVR ([place])   . Benign prostatic hyperplasia 04/26/2016  . BPH (benign prostatic hypertrophy)   . CAD (coronary artery disease) 12/02/2019  . Cardiomyopathy ([place]) 12/02/2019  . CHF (congestive heart failure) ([place]) 12/02/2019  . Chronic allergic rhinitis 04/26/2016  . Chronic respiratory failure with hypoxia ([place])   . Colon polyp   . Dyspnea 09/12/2016  .  Emphysema of lung ([place]) 04/26/2016  . Essential hypertension 04/26/2016  . GERD (gastroesophageal reflux disease) 06/13/2016  . H/O asbestos exposure 04/26/2016  . Heart murmur 04/26/2016  . Hypertension   . ILD (interstitial lung disease) ([place]) 04/26/2016  . NSTEMI (non-ST elevated myocardial infarction) ([place])   . Osteoarthritis   .  Respiratory failure ([place]) 06/13/2016  . Restrictive airway disease 06/13/2016    Past Surgical History:  Procedure Laterality Date  . CATARACT EXTRACTION, BILATERAL    . COLONOSCOPY    . HERNIA REPAIR     as child  . REPLACEMENT TOTAL KNEE Left   . RIGHT/LEFT HEART CATH AND CORONARY ANGIOGRAPHY N/A 11/16/2019   Procedure: RIGHT/LEFT HEART CATH AND CORONARY ANGIOGRAPHY;  Surgeon: Martinique, Peter M, MD;  Location: [facility];  Service: Cardiovascular;  Laterality: N/A;  . TONSILLECTOMY      Family History  Problem Relation Age of Onset  . Heart disease Mother   . Leukemia Father   . Lung disease Neg Hx   . Rheumatologic disease Neg Hx     Social History   Socioeconomic History  . Marital status: Married    Spouse name: Not on file  . Number of children: 2  . Years of education: Not on file  . Highest education level: Not on file  Occupational History  . Occupation: retired  Tobacco Use  . Smoking status: Former Smoker    Packs/day: 1.00    Years: 10.00    Pack years: 10.00    Types: Cigarettes, Pipe, Cigars    Start date: 10/02/1955    Quit date: 10/01/1965    Years since quitting: 54.9  . Smokeless tobacco: Former Systems developer    Types: Chew  Substance and Sexual Activity  . Alcohol use: No  . Drug use: No  . Sexual activity: Not on file  Other Topics Concern  . Not on file  Social History Narrative   Originally from [place]. Has always lived in [place]. Served in [employer] and has traveled aboard ship extensively. 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 Has asbestos exposure through his work for 3.5 years from 1957-1961. 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 No mold or bird exposure. Enjoys golfing.    Social Determinants of Health   Financial Resource Strain:   . Difficulty of Paying Living Expenses: Not on file  Food Insecurity:   . Worried About Charity fundraiser in the Last Year: Not on file  .  Ran Out of Food in the Last Year: Not on file  Transportation Needs:   . Lack of Transportation (Medical): Not on file  . Lack of Transportation (Non-Medical): Not on file  Physical Activity:   . Days of Exercise per Week: Not on file  . Minutes of Exercise per Session: Not on file  Stress:   . Feeling of Stress : Not on file  Social Connections:   . Frequency of Communication with Friends and Family: Not on file  . Frequency of Social Gatherings with Friends and Family: Not on file  . Attends Religious Services: Not on file  . Active Member of Clubs or Organizations: Not on file  . Attends Archivist Meetings: Not on file  . Marital Status: Not on file  Intimate Partner Violence:   . Fear of Current or Ex-Partner: Not on file  . Emotionally Abused: Not on file  .  Physically Abused: Not on file  . Sexually Abused: Not on file    Review of systems: Review of Systems  Constitutional: Negative for fever and chills.  HENT: Negative.   Eyes: Negative for blurred vision.  Respiratory: as per HPI  Cardiovascular: Negative for chest pain and palpitations.  Gastrointestinal: Negative for vomiting, diarrhea, blood per rectum. Genitourinary: Negative for dysuria, urgency, frequency and hematuria.  Musculoskeletal: Negative for myalgias, back pain and joint pain.  Skin: Negative for itching and rash.  Neurological: Negative for dizziness, tremors, focal weakness, seizures and loss of consciousness.  Endo/Heme/Allergies: Negative for environmental allergies.  Psychiatric/Behavioral: Negative for depression, suicidal ideas and hallucinations.  All other systems reviewed and are negative.  Physical Exam: Blood pressure (!) 142/70, pulse (!) 103, temperature 98 F (36.7 C), temperature source Skin, height _0  (1.905 m), weight 233 lb 9.6 oz (106 kg), SpO2 95 %. Gen:      No acute distress HEENT:  EOMI, sclera anicteric Neck:     No masses; no thyromegaly Lungs:    Bibasal  crackles CV:         Regular rate and rhythm; no murmurs Abd:      + bowel sounds; soft, non-tender; no palpable masses, no distension Ext:    No edema; adequate peripheral perfusion Skin:      Warm and dry; no rash Neuro: alert and oriented x 3 Psych: normal mood and affect  Data Reviewed: Imaging: CT high-resolution 10/21/2018-chronic stable interstitial lung disease with groundglass reticulation with reticulation, bronchiectasis with no honeycombing. No gradient. Probable thickening with calcification. Moderate emphysema I have reviewed the images personally.  PFTs: 09/12/2016 FVC 3.74 [78%], FEV1 3.19 [92%], F/F 85, DLCO 15.95 [42%] Restrictive pattern on spirometry, moderate diffusion impairment  Labs: CTD serologies 06/27/2016-negative hypersensitivity panel, significant for borderline elevation of rheumatoid factor to 24 and CCP to 92  Assessment:  Interstitial lung disease, asbestosis He has baseline asbestosis given exposure history and pleural thickening with calcification. His lung disease had remained stable but he developed a Covid-like illness in early 2020. He never got a confirmatory test for Covid.  Now with worsening dyspnea, new need for oxygen. If he did have Covid then it may have set off his ILD Reevaluate with high-res CT and PFTs. If there is clear evidence of progression then consider antifibrotics.  Plan/Recommendations: High-res CT, PFTs  Marshell Garfinkel MD [facility] 09/02/2020, 11:41 AM  CC: [name], MD

## 2020-09-02 NOTE — Patient Instructions (Signed)
We will schedule you for high-resolution CT and pulmonary function test for evaluation of interstitial lung disease Follow-up in clinic in 2 to 3 months.

## 2020-09-29 ENCOUNTER — Ambulatory Visit
Admission: RE | Admit: 2020-09-29 | Discharge: 2020-09-29 | Disposition: A | Discharge status: Home / Self Care | Payer: Medicare Other | Source: Ambulatory Visit | Attending: Pulmonary Disease | Admitting: Pulmonary Disease

## 2020-09-29 DIAGNOSIS — J849 Interstitial pulmonary disease, unspecified: Secondary | ICD-10-CM

## 2020-10-04 ENCOUNTER — Ambulatory Visit (INDEPENDENT_AMBULATORY_CARE_PROVIDER_SITE_OTHER): Payer: Medicare Other | Admitting: Pulmonary Disease

## 2020-10-04 ENCOUNTER — Ambulatory Visit: Payer: Medicare Other | Admitting: Primary Care

## 2020-10-04 ENCOUNTER — Other Ambulatory Visit: Payer: Self-pay

## 2020-10-04 ENCOUNTER — Encounter: Payer: Self-pay | Admitting: Primary Care

## 2020-10-04 ENCOUNTER — Ambulatory Visit: Payer: Medicare Other | Admitting: Adult Health

## 2020-10-04 VITALS — BP 118/62 | HR 83 | Ht 74.0 in | Wt 236.8 lb

## 2020-10-04 DIAGNOSIS — J849 Interstitial pulmonary disease, unspecified: Secondary | ICD-10-CM

## 2020-10-04 DIAGNOSIS — J61 Pneumoconiosis due to asbestos and other mineral fibers: Secondary | ICD-10-CM

## 2020-10-04 DIAGNOSIS — J9601 Acute respiratory failure with hypoxia: Secondary | ICD-10-CM | POA: Diagnosis not present

## 2020-10-04 DIAGNOSIS — J984 Other disorders of lung: Secondary | ICD-10-CM

## 2020-10-04 DIAGNOSIS — J9691 Respiratory failure, unspecified with hypoxia: Secondary | ICD-10-CM

## 2020-10-04 MED ORDER — BREO ELLIPTA 100-25 MCG/INH IN AEPB: 1.0000 | INHALATION_SPRAY | Freq: Every day | RESPIRATORY_TRACT | 0 refills | Status: DC

## 2020-10-04 NOTE — Patient Instructions (Signed)
Pleasure meeting you Mr. Pain  CAT scan of lung showed some progression of interstitial lung disease from asbestosis, likely worsened by possible Covid infection  Pulmonary function testing today also showed decline in lung function  Due to these findings Dr. Vaughan Browner had recommended considering antifibrotic medication, however, I respect your decision to hold off on adding medication at this time.  Would recommend we closely monitor with repeat imaging and lung function testing in 6 months.  Please notify our office sooner if respiratory status worsens in any way.  Also recommend you try a sample of an inhaler called Breo 1 puff daily in the morning for 2 weeks, if this improves your breathing or oxygen need in any way please let us know and we can we will send a prescription in  Orders Spirometry with DLCO in 6 months  Follow-up 6 months with Dr. Vaughan Browner

## 2020-10-04 NOTE — Progress Notes (Unsigned)
Patient seen in the office today and instructed on use of Breo Ellipta 100.  Patient expressed understanding and demonstrated technique.

## 2020-10-04 NOTE — Progress Notes (Signed)
_0  ID: [name], male    DOB: 11-26-1935, 85 y.o.   MRN: [id number]  Chief Complaint  Patient presents with  . Follow-up    PFT performed today.  Pt is about the same since last visit, has an occ cough with clear to yellow phlegm. Pt does wear 3L at rest and 4L with exertion.    Referring provider: [name], MD  HPI: 85 year old male, former smoker quit 1967 (10-pack-year history). 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 Patient of [name], seen for initial consult on 09/02/20.  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 Previously been conservatively managed by [name] in the pulmonary clinic.  Chief complaint is chronic dyspnea on exertion, cough with white mucus. Denies any fevers, chills  He developed a Covid-like illness in March 2020. Never got tested, stayed at home and did not seek medical help. He has significant dyspnea since then, got started on supplemental oxygen by his primary care.  Hospitalized in February 2021 with non-ST elevation MI, atrial fibrillation ischemic cardiomyopathy with EF of 35% underwent heart catheterization, stents could not be placed and hence being medically managed by [name]  Evaluated in the past by rheumatologist for possible rheumatoid arthritis but not found to have any evidence of connective tissue disease. He does not remember the name of the rheumatologist.  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 No mold, hot tub, Jacuzzi. No feather pillows or comforters Smoking  history: 10-pack-year smoker. Quit in 1965 Travel history: No significant travel history Relevant family history: No significant family issue of lung disease   10/04/2020- Interim hx  Patient has known history of interstitial lung disease due to asbestos. Patient reports worsening dyspnea and new oxygen need. His lung disease had remained stable until he developed Covid-like illness in early 2020, however, this was never confirmed by Covid test. He was ordered for HRCT and PFTs, if progression then consider anti-fibrotics.   Patient presents today for 1 month follow-up with PFTs. He is accompanied by family member today. Discussed HRCT results that showed interval progression of ILD. He is not interested in considering starting antifibrotics at this time. States that he doesn't want to do anything to upset how he is feeling. He would prefer to wait 6 months to repeat PFT and potentially imaging to monitor progression. He is compliant with Incruse, breathing is baseline. He has an occ cough. Wears 3L oxygen at rest and 4L on exertion. He has a lot of swelling in hands and ankles. He has hx combined CHF and is on xarolyn 2.3m daily. He had Covid booster October 2021.   Imaging:  HRCT on 09/29/2020 showed moderate centrilobular and paraseptal emphysema. Stable bilateral calcified pleural plaques, interval progression of basilar predominant fibrotic interstitial lung disease without frank honeycombing. 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 New indistinct subsolid 1.8 cm peripheral left upper lobe pulmonary nodule with 0.5 cm solid component, recommend follow-up noncontrast CT in 3 to 6 months. If unchanged, annual CT recommended until 5 years of stability. New mild bilateral axillary lymph adenopathy, nonspecific. Chronic mild mediastinal lymphadenopathy, unchanged and likely reactive. Stable dilated main pulmonary artery suggesting chronic  pulmonary hypertension. Ectatic 4.4 cm ascending  thoracic aorta. Three vessel coronary arthrosclerosis.  PFTs: 09/12/2016 FVC 3.74 [78%], FEV1 3.19 [92%], F/F 85, DLCO 15.95 [42%] Restrictive pattern on spirometry, moderate diffusion impairment  10/04/2020  FVC 2.60 (55%), FEV1 2.30 (69%), ratio 89, DLCOunc 7.35 (27%) Moderate restriction   Labs: CTD serologies 06/27/2016-negative hypersensitivity panel, significant for borderline elevation of rheumatoid factor to 24 and CCP to 92  Allergies  Allergen Reactions  . Lasix [Furosemide] Diarrhea and Other (See Comments)    Additionally, this made the patient become dehydrated (eventually)    Immunization History  Administered Date(s) Administered  . Fluad Quad(high Dose 65+) 08/19/2020  . Influenza Split 10/21/2015  . Influenza, High Dose Seasonal PF 10/15/2014, 10/21/2015, 06/13/2016, 07/19/2016, 07/09/2017, 06/25/2018, 07/18/2018, 07/13/2019, 08/01/2020  . Influenza-Unspecified 07/17/2018  . PFIZER SARS-COV-2 Vaccination 10/17/2019, 11/07/2019, 07/08/2020  . Pneumococcal Conjugate-13 10/15/2014  . Pneumococcal Polysaccharide-23 10/23/2002, 10/26/2016  . Td 11/08/2017  . Tdap 02/16/2008  . Zoster 02/16/2008, 11/01/2017, 01/29/2018    Past Medical History:  Diagnosis Date  . AAA (abdominal aortic aneurysm) ([place])   . Acute on chronic combined systolic and diastolic CHF (congestive heart failure) ([place])   . Allergic rhinitis   . Aortic aneurysm without rupture ([place]) 04/26/2016  . Atrial fibrillation with RVR ([place])   . Benign prostatic hyperplasia 04/26/2016  . BPH (benign prostatic hypertrophy)   . CAD (coronary artery disease) 12/02/2019  . Cardiomyopathy ([place]) 12/02/2019  . CHF (congestive heart failure) ([place]) 12/02/2019  . Chronic allergic rhinitis 04/26/2016  . Chronic respiratory failure with hypoxia ([place])   . Colon polyp   . Dyspnea 09/12/2016  . Emphysema of lung ([place]) 04/26/2016  . Essential hypertension 04/26/2016  . GERD (gastroesophageal reflux disease) 06/13/2016  . H/O  asbestos exposure 04/26/2016  . Heart murmur 04/26/2016  . Hypertension   . ILD (interstitial lung disease) ([place]) 04/26/2016  . NSTEMI (non-ST elevated myocardial infarction) ([place])   . Osteoarthritis   . Respiratory failure ([place]) 06/13/2016  . Restrictive airway disease 06/13/2016    Tobacco History: Social History   Tobacco Use  Smoking Status Former Smoker  . Packs/day: 1.00  . Years: 10.00  . Pack years: 10.00  . Types: Cigarettes, Pipe, Cigars  . Start date: 10/02/1955  . Quit date: 10/01/1965  . Years since quitting: 55.0  Smokeless Tobacco Former Systems developer  . Types: Chew   Counseling given: Not Answered   Outpatient Medications Prior to Visit  Medication Sig Dispense Refill  . acetaminophen (TYLENOL) 650 MG CR tablet Take 650 mg by mouth every 8 (eight) hours as needed for pain (or headaches).     Marland Kitchen apixaban (ELIQUIS) 5 MG TABS tablet Take 1 tablet (5 mg total) by mouth 2 (two) times daily. 180 tablet 2  . atorvastatin (LIPITOR) 40 MG tablet Take 1 tablet (40 mg total) by mouth daily at 6 PM. 90 tablet 2  . fluticasone (FLONASE) 50 MCG/ACT nasal spray Place 2 sprays into both nostrils daily as needed (for seasonal allergies).     Javier Docker Oil 350 MG CAPS Take 350 mg by mouth daily with breakfast.     . metolazone (ZAROXOLYN) 2.5 MG tablet Take 2.5 mg by mouth daily.    . Multiple Vitamins-Minerals (ICAPS) CAPS Take 1 capsule by mouth daily with breakfast.    . UNABLE TO FIND Take 1 capsule by mouth daily. Med Name: Super Beta Prostate    . metolazone (ZAROXOLYN) 2.5 MG tablet Take 1 tablet (2.5 mg total)  by mouth daily. 90 tablet 2   No facility-administered medications prior to visit.   Review of Systems  Review of Systems  Respiratory: Positive for cough. Negative for chest tightness and wheezing.        [place]  Cardiovascular: Positive for leg swelling.    Physical Exam  BP 118/62 (BP Location: Left Arm, Cuff Size: Large)   Pulse 83   Ht _0  (1.88 m)   Wt 236 lb 12.8  oz (107.4 kg)   SpO2 95%   BMI 30.40 kg/m  Physical Exam Constitutional:      General: He is not in acute distress.    Appearance: Normal appearance.  Cardiovascular:     Rate and Rhythm: Normal rate and regular rhythm.     Comments: +4 BLE edema  Pulmonary:     Breath sounds: Rales present.     Comments: Fine rales bil bases Musculoskeletal:     Comments: In WC  Skin:    Comments: +3-4 left hand swelling  Neurological:     General: No focal deficit present.     Mental Status: He is alert and oriented to person, place, and time. Mental status is at baseline.  Psychiatric:        Mood and Affect: Mood normal.        Behavior: Behavior normal.        Thought Content: Thought content normal.        Judgment: Judgment normal.      Lab Results:  CBC    Component Value Date/Time   WBC 9.1 11/20/2019 0327   RBC 3.86 (L) 11/20/2019 0327   HGB 11.8 (L) 11/20/2019 0327   HCT 39.0 11/20/2019 0327   PLT 237 11/20/2019 0327   MCV 101.0 (H) 11/20/2019 0327   MCH 30.6 11/20/2019 0327   MCHC 30.3 11/20/2019 0327   RDW 15.4 11/20/2019 0327   LYMPHSABS 1.0 11/18/2019 0408   MONOABS 1.0 11/18/2019 0408   EOSABS 0.4 11/18/2019 0408   BASOSABS 0.0 11/18/2019 0408    BMET    Component Value Date/Time   NA 141 04/05/2020 1637   K 4.0 04/05/2020 1637   CL 96 04/05/2020 1637   CO2 31 (H) 04/05/2020 1637   GLUCOSE 93 04/05/2020 1637   GLUCOSE 88 11/20/2019 0327   BUN 17 04/05/2020 1637   CREATININE 0.79 04/05/2020 1637   CALCIUM 8.8 04/05/2020 1637   GFRNONAA 84 04/05/2020 1637   GFRAA 97 04/05/2020 1637    BNP No results found for: BNP  ProBNP No results found for: PROBNP  Imaging: CT Chest High Resolution  Result Date: 09/29/2020 CLINICAL DATA:  Follow-up interstitial lung disease, on oxygen therapy. History of asbestos exposure per prior report. Former smoker. EXAM: CT CHEST WITHOUT CONTRAST TECHNIQUE: Multidetector CT imaging of the chest was performed following  the standard protocol without intravenous contrast. High resolution imaging of the lungs, as well as inspiratory and expiratory imaging, was performed. COMPARISON:  10/21/2018 chest CT.  12/21/2019 chest CT angiogram. FINDINGS: Cardiovascular: Normal heart size. No significant pericardial effusion/thickening. Three-vessel coronary atherosclerosis. Atherosclerotic thoracic aorta with stable ectatic 4.4 cm ascending thoracic aorta. Stable dilated main pulmonary artery (4.5 cm diameter). Mediastinum/Nodes: No discrete thyroid nodules. Unremarkable esophagus. New bilateral axillary lymphadenopathy up to 1.5 cm on the right (series 2/image 72) and 1.4 cm on the left (series 2/image 63). Stable mild right paratracheal adenopathy up to 1.2 cm (series 2/image 75). Stable mildly enlarged 1.1 cm subcarinal node (series 2/image 83).  No pathologically enlarged discrete hilar nodes on this noncontrast scan. Lungs/Pleura: No pneumothorax. Numerous bilateral thick calcified pleural plaques anteriorly and posteriorly, not appreciably changed. No pleural effusions. Moderate centrilobular and paraseptal emphysema. New indistinct subsolid 1.8 x 1.5 cm peripheral left upper lobe pulmonary nodule with 0.5 cm solid component (series 8/image 65). No additional significant pulmonary nodules. No significant lobular air trapping or evidence of tracheobronchomalacia on the expiration sequence. Patchy moderate subpleural reticulation and ground-glass opacity throughout both lungs with associated moderate traction bronchiectasis and architectural distortion. There is a mild basilar predominance to these findings. No frank honeycombing. Findings have clearly progressed in the interval. Upper abdomen: No acute abnormality. Musculoskeletal: No aggressive appearing focal osseous lesions. Marked thoracic spondylosis. IMPRESSION: 1. Stable bilateral calcified pleural plaques without pleural effusions, compatible with asbestos related pleural disease.  2. Interval progression of basilar predominant fibrotic interstitial lung disease without frank honeycombing. 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 Findings are categorized as probable UIP per consensus guidelines: Diagnosis of Idiopathic Pulmonary Fibrosis: An Official ATS/ERS/JRS/ALAT Clinical Practice Guideline. [place], Iss 5, [id number], Jun 01 2017. 3. New indistinct subsolid 1.8 cm peripheral left upper lobe pulmonary nodule with 0.5 cm solid component. Follow-up non-contrast CT recommended at 3-6 months to confirm persistence. If unchanged, and solid component remains <6 mm, annual CT is recommended until 5 years of stability has been established. If persistent these nodules should be considered highly suspicious if the solid component of the nodule is 6 mm or greater in size and enlarging. This recommendation follows the consensus statement: Guidelines for Management of Incidental Pulmonary Nodules Detected on CT Images: From the Fleischner Society 2017; Radiology 2017; 284:228-243. 4. New mild bilateral axillary lymphadenopathy, nonspecific. Correlate with any recent history of COVID vaccination to explain these findings, although the bilateral adenopathy makes this a less likely explanation. Chronic mild mediastinal lymphadenopathy, unchanged, probably reactive. The axillary nodes can be reassessed on follow-up chest CT. If there is clinical concern for a lymphoproliferative condition, axillary node biopsy could be obtained at this time. 5. Stable dilated main pulmonary artery, suggesting chronic pulmonary arterial hypertension. 6. Ectatic 4.4 cm ascending thoracic aorta. Recommend annual imaging followup by CTA or MRA. This recommendation follows 2010 ACCF/AHA/AATS/ACR/ASA/SCA/SCAI/SIR/STS/SVM Guidelines for the Diagnosis and Management of Patients with Thoracic Aortic Disease. Circulation. 2010; 121: C381-M403. Aortic aneurysm NOS (ICD10-I71.9). 7. Three-vessel  coronary atherosclerosis. 8. Aortic Atherosclerosis (ICD10-I70.0) and Emphysema (ICD10-J43.9). Electronically Signed   By: Ilona Sorrel M.D.   On: 09/29/2020 11:58     Assessment & Plan:   ILD (interstitial lung disease) ([place]) - HRCT of lungs showed some progression of interstitial lung disease from asbestosis, likely worsened by possible Covid infection. Pulmonary function testing today also showed decline in lung function.  - Due to these findings [name] had recommended considering antifibrotic medication, however, patient is not interested at this time. I respect his decision to hold off on adding medication.  Would recommend we closely monitor with repeat lung function testing and 6MWT (and possible imaging) in 3-6 months.Patient opted to follow-up with [place] in 6 months.  - Advised him to notify our office sooner if respiratory status worsens in any way.   Restrictive airway disease - PFTs showed moderate restrictive lung disease with borderline BD response - Trial Breo 100 one puff daily   Respiratory failure ([place]) - Continue 3L oxygen at rest and 4L on exertion   Martyn Ehrich, NP 10/11/2020

## 2020-10-04 NOTE — Progress Notes (Signed)
PFT done today. 

## 2020-10-11 ENCOUNTER — Other Ambulatory Visit: Payer: Self-pay

## 2020-10-11 ENCOUNTER — Telehealth: Payer: Self-pay | Admitting: Primary Care

## 2020-10-11 DIAGNOSIS — K635 Polyp of colon: Secondary | ICD-10-CM | POA: Insufficient documentation

## 2020-10-11 DIAGNOSIS — I272 Pulmonary hypertension, unspecified: Secondary | ICD-10-CM

## 2020-10-11 DIAGNOSIS — D6859 Other primary thrombophilia: Secondary | ICD-10-CM

## 2020-10-11 DIAGNOSIS — J8410 Pulmonary fibrosis, unspecified: Secondary | ICD-10-CM

## 2020-10-11 DIAGNOSIS — J431 Panlobular emphysema: Secondary | ICD-10-CM

## 2020-10-11 DIAGNOSIS — I5022 Chronic systolic (congestive) heart failure: Secondary | ICD-10-CM

## 2020-10-11 DIAGNOSIS — E7800 Pure hypercholesterolemia, unspecified: Secondary | ICD-10-CM

## 2020-10-11 DIAGNOSIS — I872 Venous insufficiency (chronic) (peripheral): Secondary | ICD-10-CM

## 2020-10-11 DIAGNOSIS — I483 Typical atrial flutter: Secondary | ICD-10-CM | POA: Insufficient documentation

## 2020-10-11 DIAGNOSIS — J61 Pneumoconiosis due to asbestos and other mineral fibers: Secondary | ICD-10-CM | POA: Insufficient documentation

## 2020-10-11 DIAGNOSIS — M069 Rheumatoid arthritis, unspecified: Secondary | ICD-10-CM

## 2020-10-11 DIAGNOSIS — J309 Allergic rhinitis, unspecified: Secondary | ICD-10-CM | POA: Insufficient documentation

## 2020-10-11 DIAGNOSIS — I700 Atherosclerosis of aorta: Secondary | ICD-10-CM

## 2020-10-11 DIAGNOSIS — I714 Abdominal aortic aneurysm, without rupture, unspecified: Secondary | ICD-10-CM | POA: Insufficient documentation

## 2020-10-11 DIAGNOSIS — M0579 Rheumatoid arthritis with rheumatoid factor of multiple sites without organ or systems involvement: Secondary | ICD-10-CM

## 2020-10-11 DIAGNOSIS — I10 Essential (primary) hypertension: Secondary | ICD-10-CM | POA: Insufficient documentation

## 2020-10-11 HISTORY — DX: Typical atrial flutter: I48.3

## 2020-10-11 HISTORY — DX: Rheumatoid arthritis, unspecified: M06.9

## 2020-10-11 HISTORY — DX: Other primary thrombophilia: D68.59

## 2020-10-11 HISTORY — DX: Rheumatoid arthritis with rheumatoid factor of multiple sites without organ or systems involvement: M05.79

## 2020-10-11 HISTORY — DX: Pneumoconiosis due to asbestos and other mineral fibers: J61

## 2020-10-11 HISTORY — DX: Chronic systolic (congestive) heart failure: I50.22

## 2020-10-11 HISTORY — DX: Pulmonary hypertension, unspecified: I27.20

## 2020-10-11 HISTORY — DX: Pure hypercholesterolemia, unspecified: E78.00

## 2020-10-11 HISTORY — DX: Panlobular emphysema: J43.1

## 2020-10-11 HISTORY — DX: Venous insufficiency (chronic) (peripheral): I87.2

## 2020-10-11 HISTORY — DX: Atherosclerosis of aorta: I70.0

## 2020-10-11 HISTORY — DX: Pulmonary fibrosis, unspecified: J84.10

## 2020-10-11 NOTE — Telephone Encounter (Signed)
Can we schedule 6MWT at next visit

## 2020-10-11 NOTE — Assessment & Plan Note (Signed)
-  PFTs showed moderate restrictive lung disease with borderline BD response - Trial Breo 100 one puff daily

## 2020-10-11 NOTE — Assessment & Plan Note (Addendum)
-  HRCT of lungs showed some progression of interstitial lung disease from asbestosis, likely worsened by possible Covid infection. Pulmonary function testing today also showed decline in lung function.  - Due to these findings Dr. Vaughan Browner had recommended considering antifibrotic medication, however, patient is not interested at this time. I respect his decision to hold off on adding medication.  Would recommend we closely monitor with repeat lung function testing and 6MWT (and possible imaging) in 3-6 months.Patient opted to follow-up with [place] in 6 months.  - Advised him to notify our office sooner if respiratory status worsens in any way.

## 2020-10-11 NOTE — Assessment & Plan Note (Signed)
-  Continue 3L oxygen at rest and 4L on exertion

## 2020-10-11 NOTE — Telephone Encounter (Signed)
At this time we are not able to get a 6MW appt scheduled. Will keep an eye on when schedule is available to see when we can get pt added and then will call pt about getting this scheduled.

## 2020-10-12 ENCOUNTER — Other Ambulatory Visit: Payer: Self-pay

## 2020-10-12 ENCOUNTER — Encounter: Payer: Self-pay | Admitting: Cardiology

## 2020-10-12 ENCOUNTER — Ambulatory Visit: Payer: Medicare Other | Admitting: Cardiology

## 2020-10-12 VITALS — BP 140/72 | HR 80 | Ht 74.0 in | Wt 236.0 lb

## 2020-10-12 DIAGNOSIS — I10 Essential (primary) hypertension: Secondary | ICD-10-CM | POA: Diagnosis not present

## 2020-10-12 DIAGNOSIS — I2510 Atherosclerotic heart disease of native coronary artery without angina pectoris: Secondary | ICD-10-CM | POA: Diagnosis not present

## 2020-10-12 DIAGNOSIS — J61 Pneumoconiosis due to asbestos and other mineral fibers: Secondary | ICD-10-CM

## 2020-10-12 NOTE — Progress Notes (Signed)
Cardiology Office Note:    Date:  10/12/2020   ID:  [name], DOB 1935/11/07, MRN [id number]  PCP:  [name], MD  Cardiologist:  Jenean Lindau, MD   Referring MD: [name], MD    ASSESSMENT:    1. Coronary artery disease involving native coronary artery of native heart without angina pectoris   2. Essential hypertension   3. Pulmonary asbestosis ([place])    PLAN:    In order of problems listed above:  1. Coronary artery disease: Secondary prevention stressed to the patient.  Importance of compliance with diet medication stressed and localized understanding. 2. Ascending thoracic aortic aneurysm: Stable at this time.  CT scan report was detailed to him.  Pulmonary findings were also elucidated and it would include being touch with his primary care physician for known cardiac findings.  He and his wife understand. 3. Essential hypertension: Blood pressure stable and diet was emphasized 4. Atrial fibrillation:I discussed with the patient atrial fibrillation, disease process. Management and therapy including rate and rhythm control, anticoagulation benefits and potential risks were discussed extensively with the patient. Patient had multiple questions which were answered to patient's satisfaction. 5. COPD on supplemental oxygen: Stable left lower lobe primary care and pulmonologist. 6. Patient will be seen in follow-up appointment in 6 months or earlier if the patient has any concerns    Medication Adjustments/Labs and Tests Ordered: Current medicines are reviewed at length with the patient today.  Concerns regarding medicines are outlined above.  No orders of the defined types were placed in this encounter.  No orders of the defined types were placed in this encounter.    Chief Complaint  Patient presents with  . Follow-up     History of Present Illness:    [name] is a 85 y.o. male patient has past medical history of coronary artery disease, aneurysm  of the ascending aorta, essential hypertension, paroxysmal atrial fibrillation and mixed dyslipidemia.  He denies any problems at this time and takes care of activities of daily living.  He is very sedentary because of COPD issues and is on oxygen.  At the time of my evaluation, the patient is alert awake oriented and in no distress.  Past Medical History:  Diagnosis Date  . AAA (abdominal aortic aneurysm) ([place])   . Acute on chronic combined systolic and diastolic CHF (congestive heart failure) ([place])   . Allergic rhinitis   . Aortic aneurysm without rupture ([place]) 04/26/2016  . Atrial fibrillation with RVR ([place])   . Benign prostatic hyperplasia 04/26/2016  . BPH (benign prostatic hypertrophy)   . CAD (coronary artery disease) 12/02/2019  . Cardiomyopathy ([place]) 12/02/2019  . CHF (congestive heart failure) ([place]) 12/02/2019  . Chronic allergic rhinitis 04/26/2016  . Chronic respiratory failure ([place]) 06/13/2016  . Chronic respiratory failure with hypoxia ([place])   . Chronic systolic heart failure ([place]) 10/11/2020  . Colon polyp   . Dyspnea 09/12/2016  . Emphysema of lung ([place]) 04/26/2016  . Essential hypertension 04/26/2016  . Gastroesophageal reflux disease 06/13/2016  . GERD (gastroesophageal reflux disease) 06/13/2016  . H/O asbestos exposure 04/26/2016  . Hardening of the aorta (main artery of the heart) ([place]) 10/11/2020  . Heart murmur 04/26/2016  . Hypercholesterolemia 10/11/2020  . Hypertension   . ILD (interstitial lung disease) ([place]) 04/26/2016  . NSTEMI (non-ST elevated myocardial infarction) ([place])   . Osteoarthritis   . Panlobular emphysema ([place]) 10/11/2020  . Peripheral venous insufficiency 10/11/2020  . Pulmonary asbestosis ([place])  10/11/2020  . Pulmonary hypertension ([place]) 10/11/2020  . Respiratory failure ([place]) 06/13/2016  . Restrictive airway disease 06/13/2016  . Rheumatoid arthritis ([place]) 10/11/2020  . 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 Thrombophilia ([place]) 10/11/2020  . Typical atrial flutter ([place]) 10/11/2020    Past Surgical History:  Procedure Laterality Date  . CATARACT EXTRACTION, BILATERAL    . COLONOSCOPY    . HERNIA REPAIR     as child  . REPLACEMENT TOTAL KNEE Left   . RIGHT/LEFT HEART CATH AND CORONARY ANGIOGRAPHY N/A 11/16/2019   Procedure: RIGHT/LEFT HEART CATH AND CORONARY ANGIOGRAPHY;  Surgeon: Martinique, Peter M, MD;  Location: [facility];  Service: Cardiovascular;  Laterality: N/A;  . TONSILLECTOMY      Current Medications: Current Meds  Medication Sig  . acetaminophen (TYLENOL) 650 MG CR tablet Take 650 mg by mouth every 8 (eight) hours as needed for pain (or headaches).   Marland Kitchen apixaban (ELIQUIS) 5 MG TABS tablet Take 1 tablet (5 mg total) by mouth 2 (two) times daily.  Marland Kitchen atorvastatin (LIPITOR) 40 MG tablet Take 1 tablet (40 mg total) by mouth daily at 6 PM.  . famotidine (PEPCID) 20 MG tablet Take 20 mg by mouth daily.  . fluticasone (FLONASE) 50 MCG/ACT nasal spray Place 2 sprays into both nostrils daily as needed (for seasonal allergies).   . fluticasone furoate-vilanterol (BREO ELLIPTA) 100-25 MCG/INH AEPB Inhale 1 puff into the lungs daily.  Javier Docker Oil 350 MG CAPS Take 350 mg by mouth daily with breakfast.   . metolazone (ZAROXOLYN) 2.5 MG tablet Take 2.5 mg by mouth daily.  . Multiple Vitamins-Minerals (ICAPS) CAPS Take 1 capsule by mouth daily with breakfast.  . UNABLE TO FIND Take 1 capsule by mouth daily. Med Name: Super Beta Prostate     Allergies:   Fosinopril, Lasix [furosemide], and Terbinafine hcl   Social History   Socioeconomic History  . Marital status: Married    Spouse name: Not on file  . Number of children: 2  . Years of education: Not on file  . Highest education level: Not on file  Occupational History  . Occupation: retired  Tobacco Use  . Smoking status: Former Smoker    Packs/day: 1.00    Years: 10.00    Pack years: 10.00     Types: Cigarettes, Pipe, Cigars    Start date: 10/02/1955    Quit date: 10/01/1965    Years since quitting: 55.0  . Smokeless tobacco: Former Systems developer    Types: Chew  Substance and Sexual Activity  . Alcohol use: No  . Drug use: No  . Sexual activity: Not on file  Other Topics Concern  . Not on file  Social History Narrative   Originally from [place]. Has always lived in [place]. Served in [employer] and has traveled aboard ship extensively. 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 Has asbestos exposure through his work for 3.5 years from 1957-1961. 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 No mold or bird exposure. Enjoys golfing.    Social Determinants of Health   Financial Resource Strain: Not on file  Food Insecurity: Not on file  Transportation Needs: Not on file  Physical Activity: Not on file  Stress: Not on file  Social Connections: Not on file     Family History: The patient's family history  includes Heart disease in his mother; Leukemia in his father. There is no history of Lung disease or Rheumatologic disease.  ROS:   Please see the history of present illness.    All other systems reviewed and are negative.  EKGs/Labs/Other Studies Reviewed:    The following studies were reviewed today: I discussed findings findings with the patient in detail.  EKG reveals sinus rhythm and nonspecific ST-T   Recent Labs: 11/18/2019: Magnesium 2.1 11/20/2019: Hemoglobin 11.8; Platelets 237 04/05/2020: [name] 10; BUN 17; Creatinine, Ser 0.79; Potassium 4.0; Sodium 141; TSH 2.020  Recent Lipid Panel    Component Value Date/Time   CHOL 103 04/05/2020 1637   TRIG 100 04/05/2020 1637   HDL 40 04/05/2020 1637   CHOLHDL 2.6 04/05/2020 1637   CHOLHDL 3.4 11/13/2019 1726   VLDL 11 11/13/2019 1726   LDLCALC 44 04/05/2020 1637    Physical Exam:    VS:  BP 140/72 (BP Location: Right Arm, Patient Position: Sitting, Cuff Size: Normal)    Pulse 80   Ht _0  (1.88 m)   Wt 236 lb (107 kg)   SpO2 94%   BMI 30.30 kg/m     Wt Readings from Last 3 Encounters:  10/12/20 236 lb (107 kg)  10/04/20 236 lb 12.8 oz (107.4 kg)  09/02/20 233 lb 9.6 oz (106 kg)     GEN: Patient is in no acute distress HEENT: Normal NECK: No JVD; No carotid bruits LYMPHATICS: No lymphadenopathy CARDIAC: Hear sounds regular, 2/6 systolic murmur at the apex. RESPIRATORY:  Clear to auscultation without rales, wheezing or rhonchi  ABDOMEN: Soft, non-tender, non-distended MUSCULOSKELETAL:  No edema; No deformity  SKIN: Warm and dry NEUROLOGIC:  Alert and oriented x 3 PSYCHIATRIC:  Normal affect   Signed, Jenean Lindau, MD  10/12/2020 3:21 PM    [facility]

## 2020-10-12 NOTE — Patient Instructions (Signed)
Medication Instructions:  No medication changes. *If you need a refill on your cardiac medications before your next appointment, please call your pharmacy*   Lab Work: None ordered If you have labs (blood work) drawn today and your tests are completely normal, you will receive your results only by: Marland Kitchen MyChart Message (if you have MyChart) OR . A paper copy in the mail If you have any lab test that is abnormal or we need to change your treatment, we will call you to review the results.   Testing/Procedures: None ordered   Follow-Up: At [facility], you and your health needs are our priority.  As part of our continuing mission to provide you with exceptional heart care, we have created designated Provider Care Teams.  These Care Teams include your primary Cardiologist (physician) and Advanced Practice Providers (APPs -  Physician Assistants and Nurse Practitioners) who all work together to provide you with the care you need, when you need it.  We recommend signing up for the patient portal called "MyChart".  Sign up information is provided on this After Visit Summary.  MyChart is used to connect with patients for Virtual Visits (Telemedicine).  Patients are able to view lab/test results, encounter notes, upcoming appointments, etc.  Non-urgent messages can be sent to your provider as well.   To learn more about what you can do with MyChart, go to [email or web address].    Your next appointment:   5 month(s)  The format for your next appointment:   In Person  Provider:   Jyl Heinz, MD   Other Instructions NA

## 2020-10-18 NOTE — Telephone Encounter (Signed)
Checked with Lauren about 6 min walks, and as of right now due to staffing issues there is no current schedule available for 6MW.  Routing to [employer] as an Pharmacist, hospital.

## 2020-10-18 NOTE — Telephone Encounter (Signed)
Ok, does not need to be until follow-up in 3-6 months. Thanks

## 2020-10-22 DIAGNOSIS — J9611 Chronic respiratory failure with hypoxia: Secondary | ICD-10-CM | POA: Diagnosis not present

## 2020-10-22 DIAGNOSIS — J8410 Pulmonary fibrosis, unspecified: Secondary | ICD-10-CM | POA: Diagnosis not present

## 2020-10-27 LAB — PULMONARY FUNCTION TEST
DL/VA % pred: 49 %
DL/VA: 1.87 ml/min/mmHg/L
DLCO cor % pred: 27 %
DLCO cor: 7.35 ml/min/mmHg
DLCO unc % pred: 27 %
DLCO unc: 7.35 ml/min/mmHg
FEF 25-75 Post: 4.93 L/sec
FEF 25-75 Pre: 3.81 L/sec
FEF2575-%Change-Post: 29 %
FEF2575-%Pred-Post: 218 %
FEF2575-%Pred-Pre: 168 %
FEV1-%Change-Post: 10 %
FEV1-%Pred-Post: 69 %
FEV1-%Pred-Pre: 63 %
FEV1-Post: 2.3 L
FEV1-Pre: 2.09 L
FEV1FVC-%Change-Post: 0 %
FEV1FVC-%Pred-Pre: 123 %
FEV6-%Change-Post: 9 %
FEV6-%Pred-Post: 59 %
FEV6-%Pred-Pre: 54 %
FEV6-Post: 2.6 L
FEV6-Pre: 2.37 L
FEV6FVC-%Change-Post: 0 %
FEV6FVC-%Pred-Post: 106 %
FEV6FVC-%Pred-Pre: 106 %
FVC-%Change-Post: 9 %
FVC-%Pred-Post: 55 %
FVC-%Pred-Pre: 51 %
FVC-Post: 2.6 L
FVC-Pre: 2.38 L
Post FEV1/FVC ratio: 89 %
Post FEV6/FVC ratio: 100 %
Pre FEV1/FVC ratio: 88 %
Pre FEV6/FVC Ratio: 100 %
RV % pred: 50 %
RV: 1.46 L
TLC % pred: 55 %
TLC: 4.36 L

## 2020-11-08 DIAGNOSIS — I10 Essential (primary) hypertension: Secondary | ICD-10-CM | POA: Diagnosis not present

## 2020-11-08 DIAGNOSIS — J9611 Chronic respiratory failure with hypoxia: Secondary | ICD-10-CM | POA: Diagnosis not present

## 2020-11-08 DIAGNOSIS — J8410 Pulmonary fibrosis, unspecified: Secondary | ICD-10-CM | POA: Diagnosis not present

## 2020-11-08 DIAGNOSIS — I272 Pulmonary hypertension, unspecified: Secondary | ICD-10-CM | POA: Diagnosis not present

## 2020-11-08 DIAGNOSIS — I483 Typical atrial flutter: Secondary | ICD-10-CM | POA: Diagnosis not present

## 2020-11-08 DIAGNOSIS — I872 Venous insufficiency (chronic) (peripheral): Secondary | ICD-10-CM | POA: Diagnosis not present

## 2020-11-08 DIAGNOSIS — I5022 Chronic systolic (congestive) heart failure: Secondary | ICD-10-CM | POA: Diagnosis not present

## 2020-11-08 DIAGNOSIS — D6869 Other thrombophilia: Secondary | ICD-10-CM | POA: Diagnosis not present

## 2020-11-09 DIAGNOSIS — E7800 Pure hypercholesterolemia, unspecified: Secondary | ICD-10-CM | POA: Diagnosis not present

## 2020-11-09 DIAGNOSIS — K219 Gastro-esophageal reflux disease without esophagitis: Secondary | ICD-10-CM | POA: Diagnosis not present

## 2020-11-09 DIAGNOSIS — J431 Panlobular emphysema: Secondary | ICD-10-CM | POA: Diagnosis not present

## 2020-11-09 DIAGNOSIS — M0579 Rheumatoid arthritis with rheumatoid factor of multiple sites without organ or systems involvement: Secondary | ICD-10-CM | POA: Diagnosis not present

## 2020-11-09 DIAGNOSIS — I272 Pulmonary hypertension, unspecified: Secondary | ICD-10-CM | POA: Diagnosis not present

## 2020-11-09 DIAGNOSIS — I5022 Chronic systolic (congestive) heart failure: Secondary | ICD-10-CM | POA: Diagnosis not present

## 2020-11-09 DIAGNOSIS — I10 Essential (primary) hypertension: Secondary | ICD-10-CM | POA: Diagnosis not present

## 2020-11-22 DIAGNOSIS — J9611 Chronic respiratory failure with hypoxia: Secondary | ICD-10-CM | POA: Diagnosis not present

## 2020-11-22 DIAGNOSIS — J8410 Pulmonary fibrosis, unspecified: Secondary | ICD-10-CM | POA: Diagnosis not present

## 2020-11-29 DIAGNOSIS — I872 Venous insufficiency (chronic) (peripheral): Secondary | ICD-10-CM | POA: Diagnosis not present

## 2020-11-29 DIAGNOSIS — D6869 Other thrombophilia: Secondary | ICD-10-CM | POA: Diagnosis not present

## 2020-11-29 DIAGNOSIS — I700 Atherosclerosis of aorta: Secondary | ICD-10-CM | POA: Diagnosis not present

## 2020-11-29 DIAGNOSIS — J8410 Pulmonary fibrosis, unspecified: Secondary | ICD-10-CM | POA: Diagnosis not present

## 2020-11-29 DIAGNOSIS — E7800 Pure hypercholesterolemia, unspecified: Secondary | ICD-10-CM | POA: Diagnosis not present

## 2020-11-29 DIAGNOSIS — I10 Essential (primary) hypertension: Secondary | ICD-10-CM | POA: Diagnosis not present

## 2020-11-29 DIAGNOSIS — I272 Pulmonary hypertension, unspecified: Secondary | ICD-10-CM | POA: Diagnosis not present

## 2020-11-29 DIAGNOSIS — J9611 Chronic respiratory failure with hypoxia: Secondary | ICD-10-CM | POA: Diagnosis not present

## 2020-11-29 DIAGNOSIS — I483 Typical atrial flutter: Secondary | ICD-10-CM | POA: Diagnosis not present

## 2020-11-29 DIAGNOSIS — Z79899 Other long term (current) drug therapy: Secondary | ICD-10-CM | POA: Diagnosis not present

## 2020-11-29 DIAGNOSIS — J431 Panlobular emphysema: Secondary | ICD-10-CM | POA: Diagnosis not present

## 2020-12-20 DIAGNOSIS — J8410 Pulmonary fibrosis, unspecified: Secondary | ICD-10-CM | POA: Diagnosis not present

## 2020-12-20 DIAGNOSIS — J9611 Chronic respiratory failure with hypoxia: Secondary | ICD-10-CM | POA: Diagnosis not present

## 2020-12-24 DIAGNOSIS — I5022 Chronic systolic (congestive) heart failure: Secondary | ICD-10-CM | POA: Diagnosis not present

## 2020-12-24 DIAGNOSIS — J431 Panlobular emphysema: Secondary | ICD-10-CM | POA: Diagnosis not present

## 2020-12-24 DIAGNOSIS — K219 Gastro-esophageal reflux disease without esophagitis: Secondary | ICD-10-CM | POA: Diagnosis not present

## 2020-12-24 DIAGNOSIS — M0579 Rheumatoid arthritis with rheumatoid factor of multiple sites without organ or systems involvement: Secondary | ICD-10-CM | POA: Diagnosis not present

## 2020-12-24 DIAGNOSIS — E7800 Pure hypercholesterolemia, unspecified: Secondary | ICD-10-CM | POA: Diagnosis not present

## 2020-12-24 DIAGNOSIS — I10 Essential (primary) hypertension: Secondary | ICD-10-CM | POA: Diagnosis not present

## 2020-12-24 DIAGNOSIS — I272 Pulmonary hypertension, unspecified: Secondary | ICD-10-CM | POA: Diagnosis not present

## 2021-01-20 DIAGNOSIS — J8410 Pulmonary fibrosis, unspecified: Secondary | ICD-10-CM | POA: Diagnosis not present

## 2021-01-20 DIAGNOSIS — J9611 Chronic respiratory failure with hypoxia: Secondary | ICD-10-CM | POA: Diagnosis not present

## 2021-02-03 DIAGNOSIS — L0390 Cellulitis, unspecified: Secondary | ICD-10-CM | POA: Diagnosis not present

## 2021-02-03 DIAGNOSIS — L0293 Carbuncle, unspecified: Secondary | ICD-10-CM | POA: Diagnosis not present

## 2021-02-15 DIAGNOSIS — M0579 Rheumatoid arthritis with rheumatoid factor of multiple sites without organ or systems involvement: Secondary | ICD-10-CM | POA: Diagnosis not present

## 2021-02-15 DIAGNOSIS — I272 Pulmonary hypertension, unspecified: Secondary | ICD-10-CM | POA: Diagnosis not present

## 2021-02-15 DIAGNOSIS — I10 Essential (primary) hypertension: Secondary | ICD-10-CM | POA: Diagnosis not present

## 2021-02-15 DIAGNOSIS — J431 Panlobular emphysema: Secondary | ICD-10-CM | POA: Diagnosis not present

## 2021-02-15 DIAGNOSIS — E7800 Pure hypercholesterolemia, unspecified: Secondary | ICD-10-CM | POA: Diagnosis not present

## 2021-02-15 DIAGNOSIS — I5022 Chronic systolic (congestive) heart failure: Secondary | ICD-10-CM | POA: Diagnosis not present

## 2021-02-15 DIAGNOSIS — K219 Gastro-esophageal reflux disease without esophagitis: Secondary | ICD-10-CM | POA: Diagnosis not present

## 2021-02-19 DIAGNOSIS — J8410 Pulmonary fibrosis, unspecified: Secondary | ICD-10-CM | POA: Diagnosis not present

## 2021-02-19 DIAGNOSIS — J9611 Chronic respiratory failure with hypoxia: Secondary | ICD-10-CM | POA: Diagnosis not present

## 2021-03-05 ENCOUNTER — Other Ambulatory Visit: Payer: Self-pay | Admitting: Cardiology

## 2021-03-06 ENCOUNTER — Other Ambulatory Visit: Payer: Self-pay | Admitting: Cardiology

## 2021-03-06 MED ORDER — METOLAZONE 2.5 MG PO TABS: 2.5000 mg | ORAL_TABLET | Freq: Every day | ORAL | 1 refills | Status: DC

## 2021-03-06 NOTE — Telephone Encounter (Signed)
Should be refilled by Dr. Ravankar/cardiology

## 2021-03-06 NOTE — Telephone Encounter (Signed)
Rx approved and sent

## 2021-03-06 NOTE — Addendum Note (Signed)
Addended by: Darrel Reach on: 03/06/2021 09:45 AM   Modules accepted: Orders

## 2021-03-17 DIAGNOSIS — I272 Pulmonary hypertension, unspecified: Secondary | ICD-10-CM | POA: Diagnosis not present

## 2021-03-17 DIAGNOSIS — I10 Essential (primary) hypertension: Secondary | ICD-10-CM | POA: Diagnosis not present

## 2021-03-17 DIAGNOSIS — I5022 Chronic systolic (congestive) heart failure: Secondary | ICD-10-CM | POA: Diagnosis not present

## 2021-03-17 DIAGNOSIS — E7800 Pure hypercholesterolemia, unspecified: Secondary | ICD-10-CM | POA: Diagnosis not present

## 2021-03-17 DIAGNOSIS — J431 Panlobular emphysema: Secondary | ICD-10-CM | POA: Diagnosis not present

## 2021-03-17 DIAGNOSIS — K219 Gastro-esophageal reflux disease without esophagitis: Secondary | ICD-10-CM | POA: Diagnosis not present

## 2021-03-17 DIAGNOSIS — M0579 Rheumatoid arthritis with rheumatoid factor of multiple sites without organ or systems involvement: Secondary | ICD-10-CM | POA: Diagnosis not present

## 2021-03-22 DIAGNOSIS — J8410 Pulmonary fibrosis, unspecified: Secondary | ICD-10-CM | POA: Diagnosis not present

## 2021-03-22 DIAGNOSIS — J9611 Chronic respiratory failure with hypoxia: Secondary | ICD-10-CM | POA: Diagnosis not present

## 2021-04-13 DIAGNOSIS — K219 Gastro-esophageal reflux disease without esophagitis: Secondary | ICD-10-CM | POA: Diagnosis not present

## 2021-04-13 DIAGNOSIS — I272 Pulmonary hypertension, unspecified: Secondary | ICD-10-CM | POA: Diagnosis not present

## 2021-04-13 DIAGNOSIS — I5022 Chronic systolic (congestive) heart failure: Secondary | ICD-10-CM | POA: Diagnosis not present

## 2021-04-13 DIAGNOSIS — I10 Essential (primary) hypertension: Secondary | ICD-10-CM | POA: Diagnosis not present

## 2021-04-13 DIAGNOSIS — E7800 Pure hypercholesterolemia, unspecified: Secondary | ICD-10-CM | POA: Diagnosis not present

## 2021-04-13 DIAGNOSIS — J431 Panlobular emphysema: Secondary | ICD-10-CM | POA: Diagnosis not present

## 2021-04-13 DIAGNOSIS — M0579 Rheumatoid arthritis with rheumatoid factor of multiple sites without organ or systems involvement: Secondary | ICD-10-CM | POA: Diagnosis not present

## 2021-04-19 ENCOUNTER — Other Ambulatory Visit: Payer: Self-pay

## 2021-04-19 DIAGNOSIS — D497 Neoplasm of unspecified behavior of endocrine glands and other parts of nervous system: Secondary | ICD-10-CM

## 2021-04-19 DIAGNOSIS — I509 Heart failure, unspecified: Secondary | ICD-10-CM

## 2021-04-19 DIAGNOSIS — I712 Thoracic aortic aneurysm, without rupture, unspecified: Secondary | ICD-10-CM | POA: Insufficient documentation

## 2021-04-19 DIAGNOSIS — I429 Cardiomyopathy, unspecified: Secondary | ICD-10-CM

## 2021-04-19 DIAGNOSIS — I272 Pulmonary hypertension, unspecified: Secondary | ICD-10-CM

## 2021-04-19 DIAGNOSIS — M109 Gout, unspecified: Secondary | ICD-10-CM

## 2021-04-19 DIAGNOSIS — R69 Illness, unspecified: Secondary | ICD-10-CM | POA: Insufficient documentation

## 2021-04-19 DIAGNOSIS — I2723 Pulmonary hypertension due to lung diseases and hypoxia: Secondary | ICD-10-CM

## 2021-04-19 DIAGNOSIS — F528 Other sexual dysfunction not due to a substance or known physiological condition: Secondary | ICD-10-CM

## 2021-04-19 DIAGNOSIS — Z7901 Long term (current) use of anticoagulants: Secondary | ICD-10-CM

## 2021-04-19 DIAGNOSIS — J849 Interstitial pulmonary disease, unspecified: Secondary | ICD-10-CM | POA: Insufficient documentation

## 2021-04-19 HISTORY — DX: Pulmonary hypertension due to lung diseases and hypoxia: I27.23

## 2021-04-19 HISTORY — DX: Thoracic aortic aneurysm, without rupture: I71.2

## 2021-04-19 HISTORY — DX: Gout, unspecified: M10.9

## 2021-04-19 HISTORY — DX: Cardiomyopathy, unspecified: I42.9

## 2021-04-19 HISTORY — DX: Interstitial pulmonary disease, unspecified: J84.9

## 2021-04-19 HISTORY — DX: Long term (current) use of anticoagulants: Z79.01

## 2021-04-19 HISTORY — DX: Thoracic aortic aneurysm, without rupture, unspecified: I71.20

## 2021-04-19 HISTORY — DX: Heart failure, unspecified: I50.9

## 2021-04-19 HISTORY — DX: Other sexual dysfunction not due to a substance or known physiological condition: F52.8

## 2021-04-19 HISTORY — DX: Neoplasm of unspecified behavior of endocrine glands and other parts of nervous system: D49.7

## 2021-04-19 HISTORY — DX: Illness, unspecified: R69

## 2021-04-19 HISTORY — DX: Pulmonary hypertension, unspecified: I27.20

## 2021-04-21 ENCOUNTER — Other Ambulatory Visit: Payer: Self-pay

## 2021-04-21 ENCOUNTER — Ambulatory Visit: Payer: Medicare Other | Admitting: Cardiology

## 2021-04-21 ENCOUNTER — Encounter: Payer: Self-pay | Admitting: Cardiology

## 2021-04-21 VITALS — BP 105/72 | HR 98 | Ht 75.0 in | Wt 243.0 lb

## 2021-04-21 DIAGNOSIS — I480 Paroxysmal atrial fibrillation: Secondary | ICD-10-CM

## 2021-04-21 DIAGNOSIS — I2510 Atherosclerotic heart disease of native coronary artery without angina pectoris: Secondary | ICD-10-CM

## 2021-04-21 DIAGNOSIS — I712 Thoracic aortic aneurysm, without rupture, unspecified: Secondary | ICD-10-CM

## 2021-04-21 DIAGNOSIS — J9611 Chronic respiratory failure with hypoxia: Secondary | ICD-10-CM | POA: Diagnosis not present

## 2021-04-21 DIAGNOSIS — J8410 Pulmonary fibrosis, unspecified: Secondary | ICD-10-CM | POA: Diagnosis not present

## 2021-04-21 HISTORY — DX: Paroxysmal atrial fibrillation: I48.0

## 2021-04-21 NOTE — Patient Instructions (Signed)

## 2021-04-21 NOTE — Progress Notes (Signed)
Cardiology Office Note:    Date:  04/21/2021   ID:  [name], DOB 07-10-1936, MRN [id number]  PCP:  William Manes, MD  Cardiologist:  William Lindau, MD   Referring MD: William Manes, MD    ASSESSMENT:    1. Thoracic aortic aneurysm without rupture ([place])   2. Coronary artery disease involving native coronary artery of native heart without angina pectoris   3. PAF (paroxysmal atrial fibrillation) (HCC)    PLAN:    In order of problems listed above:  Primary prevention stressed with patient.  Importance of compliance with diet medication stressed and vocalized understanding. Paroxysmal atrial fibrillation:I discussed with the patient atrial fibrillation, disease process. Management and therapy including rate and rhythm control, anticoagulation benefits and potential risks were discussed extensively with the patient. Patient had multiple questions which were answered to patient's satisfaction. Ascending aortic aneurysm:: Stable at this time.  CT report discussed with the patient. Essential hypertension: Blood pressure stable and diet was emphasized. COPD and asbestosis of the lung.  Patient is well aware of this and has discussions and followed by primary care for this.  He will have complete blood work at primary care's office in the next few days and send me a copy. Patient will be seen in follow-up appointment in 6 months or earlier if the patient has any concerns    Medication Adjustments/Labs and Tests Ordered: Current medicines are reviewed at length with the patient today.  Concerns regarding medicines are outlined above.  No orders of the defined types were placed in this encounter.  No orders of the defined types were placed in this encounter.    No chief complaint on file.    History of Present Illness:    [name] is a 85 y.o. male.  Patient has past medical history of paroxysmal atrial fibrillation, essential hypertension, dyslipidemia and ascending  aortic aneurysm.  He denies any problems at this time and takes care of activities of daily living.  No chest pain orthopnea or PND.  Patient is brought in a wheelchair and is on oxygen supplement 24 hours.  His wife is very supportive and accompanies him for this visit.  At the time of my evaluation, the patient is alert awake oriented and in no distress.  Past Medical History:  Diagnosis Date   AAA (abdominal aortic aneurysm) (H[facility])    Acute on chronic combined systolic and diastolic CHF (congestive heart failure) ([facility])    Allergic rhinitis    Aortic aneurysm without rupture ([place]) 04/26/2016   Atrial fibrillation with RVR (H[facility])    Benign prostatic hyperplasia 04/26/2016   CAD (coronary artery disease) 12/02/2019   Cardiomyopathy ([place]) 12/02/2019   CHF (congestive heart failure) ([place]) 12/02/2019   Chronic allergic rhinitis 04/26/2016   Chronic respiratory failure ([place]) 06/13/2016   Chronic respiratory failure with hypoxia (HCC)    Chronic systolic heart failure ([place]) 10/11/2020   Colon polyp    Congestive heart failure due to cardiomyopathy ([place]) 04/19/2021   Dyspnea 09/12/2016   Emphysema of lung ([place]) 04/26/2016   Essential hypertension 04/26/2016   Gastroesophageal reflux disease 06/13/2016   Gout 04/19/2021   H/O asbestos exposure 04/26/2016   Hardening of the aorta (main artery of the heart) ([place]) 10/11/2020   Heart murmur 04/26/2016   Hypercholesterolemia 10/11/2020   Hypertension    ILD (interstitial lung disease) ([place]) 04/26/2016   Long term (current) use of anticoagulants 04/19/2021   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   Thrombophilia ([place]) 10/11/2020   Typical atrial flutter ([place]) 10/11/2020    Past Surgical History:  Procedure Laterality Date   CATARACT EXTRACTION, BILATERAL     COLONOSCOPY     HERNIA REPAIR     as child   REPLACEMENT TOTAL KNEE Left    RIGHT/LEFT HEART CATH AND CORONARY ANGIOGRAPHY N/A 11/16/2019   Procedure: RIGHT/LEFT HEART CATH AND CORONARY ANGIOGRAPHY;  Surgeon: Martinique, Peter M, MD;  Location: [facility];  Service: Cardiovascular;  Laterality: N/A;   TONSILLECTOMY      Current Medications: Current Meds  Medication Sig   acetaminophen (TYLENOL) 650 MG CR tablet Take 650 mg by mouth every 8 (eight) hours as needed for pain (or headaches).    apixaban (ELIQUIS) 5 MG TABS tablet Take 1 tablet (5 mg total) by mouth 2 (two) times daily.   atorvastatin (LIPITOR) 40 MG tablet Take 1 tablet (40 mg total) by mouth daily at 6 PM.   famotidine (PEPCID) 20 MG tablet Take 20 mg by  mouth daily.   fluticasone (FLONASE) 50 MCG/ACT nasal spray Place 2 sprays into both nostrils daily as needed for allergies (for seasonal allergies).   Krill Oil 350 MG CAPS Take 350 mg by mouth daily with breakfast.    metolazone (ZAROXOLYN) 2.5 MG tablet Take 1 tablet (2.5 mg total) by mouth daily.   Multiple Vitamins-Minerals (ICAPS) CAPS Take 1 capsule by mouth daily with breakfast.   torsemide (DEMADEX) 20 MG tablet Take 20 mg by mouth daily.   triamcinolone cream (KENALOG) 0.5 % Apply 1 application topically daily.   UNABLE TO FIND Take 1 capsule by mouth daily. Med Name: Super Beta Prostate     Allergies:   Fosinopril, Lasix [furosemide], and Terbinafine hcl   Social History   Socioeconomic History   Marital status: Married    Spouse name: Not on file   Number of children: 2   Years of education: Not on file   Highest education level: Not on file  Occupational History   Occupation: retired  Tobacco Use   Smoking status: Former    Packs/day: 1.00    Years: 10.00    Pack years: 10.00    Types: Cigarettes, Pipe, Cigars    Start date: 10/02/1955  Quit date: 10/01/1965    Years since quitting: 55.5   Smokeless tobacco: Former    Types: Chew  Substance and Sexual Activity   Alcohol use: No   Drug use: No   Sexual activity: Not on file  Other Topics Concern   Not on file  Social History Narrative   Originally from [place]. Has always lived in [place]. Served in [employer] and has traveled aboard ship extensively. 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 Has asbestos exposure through his work for 3.5 years from 1957-1961. 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 No mold or bird exposure. Enjoys golfing.    Social Determinants of Health   Financial Resource Strain: Not on file  Food Insecurity: Not on file  Transportation Needs: Not on file  Physical Activity: Not on file  Stress: Not on file  Social Connections:  Not on file     Family History: The patient's family history includes Heart disease in his mother; Leukemia in his father. There is no history of Lung disease or Rheumatologic disease.  ROS:   Please see the history of present illness.    All other systems reviewed and are negative.  EKGs/Labs/Other Studies Reviewed:    The following studies were reviewed today: EK G reveals sinus rhythm PACs and nonspecific ST-T changes   Recent Labs: No results found for requested labs within last 8760 hours.  Recent Lipid Panel    Component Value Date/Time   CHOL 103 04/05/2020 1637   TRIG 100 04/05/2020 1637   HDL 40 04/05/2020 1637   CHOLHDL 2.6 04/05/2020 1637   CHOLHDL 3.4 11/13/2019 1726   VLDL 11 11/13/2019 1726   LDLCALC 44 04/05/2020 1637    Physical Exam:    VS:  BP 105/72   Pulse 98   Ht 6' 3" (1.905 m)   Wt 243 lb (110.2 kg)   SpO2 95%   BMI 30.37 kg/m     Wt Readings from Last 3 Encounters:  04/21/21 243 lb (110.2 kg)  10/12/20 236 lb (107 kg)  10/04/20 236 lb 12.8 oz (107.4 kg)     GEN: Patient is in no acute distress HEENT: Normal NECK: No JVD; No carotid bruits LYMPHATICS: No lymphadenopathy CARDIAC: Hear sounds regular, 2/6 systolic murmur at the apex. RESPIRATORY:  Clear to auscultation without rales, wheezing or rhonchi  ABDOMEN: Soft, non-tender, non-distended MUSCULOSKELETAL:  No edema; No deformity  SKIN: Warm and dry NEUROLOGIC:  Alert and oriented x 3 PSYCHIATRIC:  Normal affect   Signed, William Lindau, MD  04/21/2021 2:25 PM    [facility]

## 2021-05-09 DIAGNOSIS — Z0000 Encounter for general adult medical examination without abnormal findings: Secondary | ICD-10-CM | POA: Diagnosis not present

## 2021-05-09 DIAGNOSIS — J9611 Chronic respiratory failure with hypoxia: Secondary | ICD-10-CM | POA: Diagnosis not present

## 2021-05-09 DIAGNOSIS — E7800 Pure hypercholesterolemia, unspecified: Secondary | ICD-10-CM | POA: Diagnosis not present

## 2021-05-09 DIAGNOSIS — Z1389 Encounter for screening for other disorder: Secondary | ICD-10-CM | POA: Diagnosis not present

## 2021-05-09 DIAGNOSIS — D649 Anemia, unspecified: Secondary | ICD-10-CM | POA: Diagnosis not present

## 2021-05-09 DIAGNOSIS — J8410 Pulmonary fibrosis, unspecified: Secondary | ICD-10-CM | POA: Diagnosis not present

## 2021-05-09 DIAGNOSIS — I700 Atherosclerosis of aorta: Secondary | ICD-10-CM | POA: Diagnosis not present

## 2021-05-09 DIAGNOSIS — I5022 Chronic systolic (congestive) heart failure: Secondary | ICD-10-CM | POA: Diagnosis not present

## 2021-05-09 DIAGNOSIS — J431 Panlobular emphysema: Secondary | ICD-10-CM | POA: Diagnosis not present

## 2021-05-09 DIAGNOSIS — I10 Essential (primary) hypertension: Secondary | ICD-10-CM | POA: Diagnosis not present

## 2021-05-09 DIAGNOSIS — Z79899 Other long term (current) drug therapy: Secondary | ICD-10-CM | POA: Diagnosis not present

## 2021-05-09 DIAGNOSIS — I272 Pulmonary hypertension, unspecified: Secondary | ICD-10-CM | POA: Diagnosis not present

## 2021-05-09 DIAGNOSIS — I483 Typical atrial flutter: Secondary | ICD-10-CM | POA: Diagnosis not present

## 2021-05-22 DIAGNOSIS — J9611 Chronic respiratory failure with hypoxia: Secondary | ICD-10-CM | POA: Diagnosis not present

## 2021-05-22 DIAGNOSIS — J8410 Pulmonary fibrosis, unspecified: Secondary | ICD-10-CM | POA: Diagnosis not present

## 2021-05-30 DIAGNOSIS — H02042 Spastic entropion of right lower eyelid: Secondary | ICD-10-CM | POA: Diagnosis not present

## 2021-05-30 DIAGNOSIS — H353131 Nonexudative age-related macular degeneration, bilateral, early dry stage: Secondary | ICD-10-CM | POA: Diagnosis not present

## 2021-05-30 DIAGNOSIS — H26493 Other secondary cataract, bilateral: Secondary | ICD-10-CM | POA: Diagnosis not present

## 2021-05-30 DIAGNOSIS — H35372 Puckering of macula, left eye: Secondary | ICD-10-CM | POA: Diagnosis not present

## 2021-06-06 DIAGNOSIS — Z79899 Other long term (current) drug therapy: Secondary | ICD-10-CM | POA: Diagnosis not present

## 2021-06-21 DIAGNOSIS — I272 Pulmonary hypertension, unspecified: Secondary | ICD-10-CM | POA: Diagnosis not present

## 2021-06-21 DIAGNOSIS — M0579 Rheumatoid arthritis with rheumatoid factor of multiple sites without organ or systems involvement: Secondary | ICD-10-CM | POA: Diagnosis not present

## 2021-06-21 DIAGNOSIS — I10 Essential (primary) hypertension: Secondary | ICD-10-CM | POA: Diagnosis not present

## 2021-06-21 DIAGNOSIS — E7800 Pure hypercholesterolemia, unspecified: Secondary | ICD-10-CM | POA: Diagnosis not present

## 2021-06-21 DIAGNOSIS — J431 Panlobular emphysema: Secondary | ICD-10-CM | POA: Diagnosis not present

## 2021-06-21 DIAGNOSIS — I5022 Chronic systolic (congestive) heart failure: Secondary | ICD-10-CM | POA: Diagnosis not present

## 2021-06-21 DIAGNOSIS — K219 Gastro-esophageal reflux disease without esophagitis: Secondary | ICD-10-CM | POA: Diagnosis not present

## 2021-06-22 DIAGNOSIS — J8410 Pulmonary fibrosis, unspecified: Secondary | ICD-10-CM | POA: Diagnosis not present

## 2021-06-22 DIAGNOSIS — J9611 Chronic respiratory failure with hypoxia: Secondary | ICD-10-CM | POA: Diagnosis not present

## 2021-07-22 DIAGNOSIS — J9611 Chronic respiratory failure with hypoxia: Secondary | ICD-10-CM | POA: Diagnosis not present

## 2021-07-22 DIAGNOSIS — J8410 Pulmonary fibrosis, unspecified: Secondary | ICD-10-CM | POA: Diagnosis not present

## 2021-07-26 DIAGNOSIS — I8310 Varicose veins of unspecified lower extremity with inflammation: Secondary | ICD-10-CM | POA: Diagnosis not present

## 2021-08-22 DIAGNOSIS — J9611 Chronic respiratory failure with hypoxia: Secondary | ICD-10-CM | POA: Diagnosis not present

## 2021-08-22 DIAGNOSIS — J8410 Pulmonary fibrosis, unspecified: Secondary | ICD-10-CM | POA: Diagnosis not present

## 2021-08-24 ENCOUNTER — Emergency Department (HOSPITAL_COMMUNITY): Payer: Medicare Other

## 2021-08-24 ENCOUNTER — Inpatient Hospital Stay (HOSPITAL_COMMUNITY)
Admission: EM | Admit: 2021-08-24 | Discharge: 2021-08-28 | DRG: 291 | Disposition: A | Discharge status: Home / Self Care | Payer: Medicare Other | Attending: Family Medicine | Admitting: Family Medicine

## 2021-08-24 DIAGNOSIS — I5033 Acute on chronic diastolic (congestive) heart failure: Secondary | ICD-10-CM

## 2021-08-24 DIAGNOSIS — Z66 Do not resuscitate: Secondary | ICD-10-CM | POA: Diagnosis not present

## 2021-08-24 DIAGNOSIS — I252 Old myocardial infarction: Secondary | ICD-10-CM | POA: Diagnosis not present

## 2021-08-24 DIAGNOSIS — E7800 Pure hypercholesterolemia, unspecified: Secondary | ICD-10-CM | POA: Diagnosis present

## 2021-08-24 DIAGNOSIS — R0602 Shortness of breath: Secondary | ICD-10-CM | POA: Diagnosis not present

## 2021-08-24 DIAGNOSIS — I509 Heart failure, unspecified: Secondary | ICD-10-CM

## 2021-08-24 DIAGNOSIS — Z96652 Presence of left artificial knee joint: Secondary | ICD-10-CM | POA: Diagnosis present

## 2021-08-24 DIAGNOSIS — R778 Other specified abnormalities of plasma proteins: Secondary | ICD-10-CM | POA: Diagnosis not present

## 2021-08-24 DIAGNOSIS — Z87891 Personal history of nicotine dependence: Secondary | ICD-10-CM | POA: Diagnosis not present

## 2021-08-24 DIAGNOSIS — Z8249 Family history of ischemic heart disease and other diseases of the circulatory system: Secondary | ICD-10-CM | POA: Diagnosis not present

## 2021-08-24 DIAGNOSIS — I959 Hypotension, unspecified: Secondary | ICD-10-CM | POA: Diagnosis not present

## 2021-08-24 DIAGNOSIS — I110 Hypertensive heart disease with heart failure: Secondary | ICD-10-CM | POA: Diagnosis not present

## 2021-08-24 DIAGNOSIS — Z79899 Other long term (current) drug therapy: Secondary | ICD-10-CM

## 2021-08-24 DIAGNOSIS — J439 Emphysema, unspecified: Secondary | ICD-10-CM | POA: Diagnosis not present

## 2021-08-24 DIAGNOSIS — I7121 Aneurysm of the ascending aorta, without rupture: Secondary | ICD-10-CM | POA: Diagnosis present

## 2021-08-24 DIAGNOSIS — I480 Paroxysmal atrial fibrillation: Secondary | ICD-10-CM | POA: Diagnosis not present

## 2021-08-24 DIAGNOSIS — K219 Gastro-esophageal reflux disease without esophagitis: Secondary | ICD-10-CM | POA: Diagnosis present

## 2021-08-24 DIAGNOSIS — Z9981 Dependence on supplemental oxygen: Secondary | ICD-10-CM

## 2021-08-24 DIAGNOSIS — Z20822 Contact with and (suspected) exposure to covid-19: Secondary | ICD-10-CM | POA: Diagnosis not present

## 2021-08-24 DIAGNOSIS — Z743 Need for continuous supervision: Secondary | ICD-10-CM | POA: Diagnosis not present

## 2021-08-24 DIAGNOSIS — J9611 Chronic respiratory failure with hypoxia: Secondary | ICD-10-CM | POA: Diagnosis present

## 2021-08-24 DIAGNOSIS — I5023 Acute on chronic systolic (congestive) heart failure: Secondary | ICD-10-CM

## 2021-08-24 DIAGNOSIS — I714 Abdominal aortic aneurysm, without rupture, unspecified: Secondary | ICD-10-CM | POA: Diagnosis not present

## 2021-08-24 DIAGNOSIS — I471 Supraventricular tachycardia: Secondary | ICD-10-CM | POA: Diagnosis not present

## 2021-08-24 DIAGNOSIS — I4891 Unspecified atrial fibrillation: Secondary | ICD-10-CM

## 2021-08-24 DIAGNOSIS — Z7901 Long term (current) use of anticoagulants: Secondary | ICD-10-CM

## 2021-08-24 DIAGNOSIS — R079 Chest pain, unspecified: Secondary | ICD-10-CM | POA: Diagnosis not present

## 2021-08-24 DIAGNOSIS — I248 Other forms of acute ischemic heart disease: Secondary | ICD-10-CM | POA: Diagnosis present

## 2021-08-24 DIAGNOSIS — I517 Cardiomegaly: Secondary | ICD-10-CM | POA: Diagnosis not present

## 2021-08-24 DIAGNOSIS — R000 Tachycardia, unspecified: Secondary | ICD-10-CM | POA: Diagnosis not present

## 2021-08-24 DIAGNOSIS — R0789 Other chest pain: Secondary | ICD-10-CM | POA: Diagnosis not present

## 2021-08-24 DIAGNOSIS — R0902 Hypoxemia: Secondary | ICD-10-CM | POA: Diagnosis not present

## 2021-08-24 DIAGNOSIS — J61 Pneumoconiosis due to asbestos and other mineral fibers: Secondary | ICD-10-CM | POA: Diagnosis present

## 2021-08-24 DIAGNOSIS — Z806 Family history of leukemia: Secondary | ICD-10-CM | POA: Diagnosis not present

## 2021-08-24 DIAGNOSIS — I2510 Atherosclerotic heart disease of native coronary artery without angina pectoris: Secondary | ICD-10-CM | POA: Diagnosis not present

## 2021-08-24 DIAGNOSIS — I5041 Acute combined systolic (congestive) and diastolic (congestive) heart failure: Secondary | ICD-10-CM | POA: Diagnosis not present

## 2021-08-24 DIAGNOSIS — L89311 Pressure ulcer of right buttock, stage 1: Secondary | ICD-10-CM | POA: Diagnosis not present

## 2021-08-24 DIAGNOSIS — L89321 Pressure ulcer of left buttock, stage 1: Secondary | ICD-10-CM | POA: Diagnosis not present

## 2021-08-24 DIAGNOSIS — L8990 Pressure ulcer of unspecified site, unspecified stage: Secondary | ICD-10-CM | POA: Insufficient documentation

## 2021-08-24 DIAGNOSIS — I5043 Acute on chronic combined systolic (congestive) and diastolic (congestive) heart failure: Secondary | ICD-10-CM | POA: Diagnosis not present

## 2021-08-24 HISTORY — DX: Acute on chronic systolic (congestive) heart failure: I50.23

## 2021-08-24 LAB — COMPREHENSIVE METABOLIC PANEL
ALT: 9 U/L (ref 0–44)
AST: 17 U/L (ref 15–41)
Albumin: 3.1 g/dL — ABNORMAL LOW (ref 3.5–5.0)
Alkaline Phosphatase: 60 U/L (ref 38–126)
Anion gap: 10 (ref 5–15)
BUN: 33 mg/dL — ABNORMAL HIGH (ref 8–23)
CO2: 31 mmol/L (ref 22–32)
Calcium: 8.9 mg/dL (ref 8.9–10.3)
Chloride: 96 mmol/L — ABNORMAL LOW (ref 98–111)
Creatinine, Ser: 1.22 mg/dL (ref 0.61–1.24)
GFR, Estimated: 59 mL/min — ABNORMAL LOW (ref >60)
Glucose, Bld: 128 mg/dL — ABNORMAL HIGH (ref 70–99)
Potassium: 3.8 mmol/L (ref 3.5–5.1)
Sodium: 137 mmol/L (ref 135–145)
Total Bilirubin: 0.7 mg/dL (ref 0.3–1.2)
Total Protein: 7.6 g/dL (ref 6.5–8.1)

## 2021-08-24 LAB — CBC WITH DIFFERENTIAL/PLATELET
Abs Immature Granulocytes: 0.04 10*3/uL (ref 0.00–0.07)
Basophils Absolute: 0.1 10*3/uL (ref 0.0–0.1)
Basophils Relative: 1 %
Eosinophils Absolute: 0.3 10*3/uL (ref 0.0–0.5)
Eosinophils Relative: 3 %
HCT: 39.9 % (ref 39.0–52.0)
Hemoglobin: 12 g/dL — ABNORMAL LOW (ref 13.0–17.0)
Immature Granulocytes: 0 %
Lymphocytes Relative: 15 %
Lymphs Abs: 1.4 10*3/uL (ref 0.7–4.0)
MCH: 30.3 pg (ref 26.0–34.0)
MCHC: 30.1 g/dL (ref 30.0–36.0)
MCV: 100.8 fL — ABNORMAL HIGH (ref 80.0–100.0)
Monocytes Absolute: 0.8 10*3/uL (ref 0.1–1.0)
Monocytes Relative: 8 %
Neutro Abs: 7 10*3/uL (ref 1.7–7.7)
Neutrophils Relative %: 73 %
Platelets: 242 10*3/uL (ref 150–400)
RBC: 3.96 MIL/uL — ABNORMAL LOW (ref 4.22–5.81)
RDW: 15.5 % (ref 11.5–15.5)
WBC: 9.7 10*3/uL (ref 4.0–10.5)
nRBC: 0 % (ref 0.0–0.2)

## 2021-08-24 LAB — RESP PANEL BY RT-PCR (FLU A&B, COVID) ARPGX2
Influenza A by PCR: NEGATIVE
Influenza B by PCR: NEGATIVE
SARS Coronavirus 2 by RT PCR: NEGATIVE

## 2021-08-24 LAB — BRAIN NATRIURETIC PEPTIDE: B Natriuretic Peptide: 778.6 pg/mL — ABNORMAL HIGH (ref 0.0–100.0)

## 2021-08-24 LAB — TSH: TSH: 2.779 u[IU]/mL (ref 0.350–4.500)

## 2021-08-24 LAB — TROPONIN I (HIGH SENSITIVITY)
Troponin I (High Sensitivity): 19 ng/L — ABNORMAL HIGH (ref <18)
Troponin I (High Sensitivity): 63 ng/L — ABNORMAL HIGH (ref <18)

## 2021-08-24 LAB — MAGNESIUM: Magnesium: 1.8 mg/dL (ref 1.7–2.4)

## 2021-08-24 MED ORDER — FUROSEMIDE 10 MG/ML IJ SOLN
40.0000 mg | Freq: Once | INTRAMUSCULAR | Status: AC
  Administered 2021-08-24: 40 mg via INTRAVENOUS
  Filled 2021-08-24: qty 4

## 2021-08-24 MED ORDER — ACETAMINOPHEN 325 MG PO TABS
650.0000 mg | ORAL_TABLET | Freq: Four times a day (QID) | ORAL | Status: DC | PRN
  Administered 2021-08-26 – 2021-08-28 (×3): 650 mg via ORAL
  Filled 2021-08-24 (×3): qty 2

## 2021-08-24 MED ORDER — LABETALOL HCL 5 MG/ML IV SOLN
5.0000 mg | Freq: Once | INTRAVENOUS | Status: AC
  Administered 2021-08-24: 5 mg via INTRAVENOUS
  Filled 2021-08-24: qty 4

## 2021-08-24 MED ORDER — ACETAMINOPHEN 650 MG RE SUPP: 650.0000 mg | Freq: Four times a day (QID) | RECTAL | Status: DC | PRN

## 2021-08-24 NOTE — ED Provider Notes (Signed)
[facility] EMERGENCY DEPARTMENT Provider Note   CSN: [id number] Arrival date & time: 08/24/21  1820     History Chief Complaint  Patient presents with   Atrial Fibrillation   Chest Pain    BATU CASSIN is a 85 y.o. male.  The history is provided by the patient and the EMS personnel.  Chest Pain Pain location:  Substernal area Pain quality: pressure   Pain radiates to:  Does not radiate Pain severity:  Moderate Onset quality:  Sudden Duration:  3 hours Timing:  Constant Progression:  Resolved Context: at rest   Relieved by:  Aspirin and oxygen Associated symptoms: palpitations and shortness of breath   Associated symptoms: no abdominal pain, no back pain, no cough, no fever and no vomiting       Past Medical History:  Diagnosis Date   AAA (abdominal aortic aneurysm) (HCC)    Acute on chronic combined systolic and diastolic CHF (congestive heart failure) (HCC)    Allergic rhinitis    Aortic aneurysm without rupture ([place]) 04/26/2016   Atrial fibrillation with RVR (HCC)    Benign prostatic hyperplasia 04/26/2016   CAD (coronary artery disease) 12/02/2019   Cardiomyopathy ([place]) 12/02/2019   CHF (congestive heart failure) ([place]) 12/02/2019   Chronic allergic rhinitis 04/26/2016   Chronic respiratory failure ([place]) 06/13/2016   Chronic respiratory failure with hypoxia (HCC)    Chronic systolic heart failure ([place]) 10/11/2020   Colon polyp    Congestive heart failure due to cardiomyopathy ([place]) 04/19/2021   Dyspnea 09/12/2016   Emphysema of lung ([place]) 04/26/2016   Essential hypertension 04/26/2016   Gastroesophageal reflux disease 06/13/2016   Gout 04/19/2021   H/O asbestos exposure 04/26/2016   Hardening of the aorta (main artery of the heart) ([place]) 10/11/2020   Heart murmur 04/26/2016   Hypercholesterolemia 10/11/2020   Hypertension    ILD (interstitial lung disease) ([place]) 04/26/2016   Long term (current) use of anticoagulants 04/19/2021    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[place]) 10/11/2020   Pulmonary fibrosis ([place]) 10/11/2020   Pulmonary hypertension ([place]) 10/11/2020   Pulmonary hypertension due to interstitial lung disease ([place]) 04/19/2021   Pulmonary hypertension, unspecified ([place]) 04/19/2021   Restrictive airway disease 06/13/2016   Rheumatoid arthritis ([place]) 10/11/2020   Rheumatoid arthritis with rheumatoid factor of multiple sites without organ or systems involvement ([place]) 10/11/2020   Thoracic aortic aneurysm without rupture ([place]) 04/19/2021   Dec 25, 2017 Entered By: Thermon Leyland Comment: Private surveillance.   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   Other ill-defined and unknown causes of morbidity and mortality 04/19/2021    Psychosexual dysfunction with inhibited sexual excitement 04/19/2021   Pulmonary hypertension due to interstitial lung disease ([place]) 04/19/2021   Thoracic aortic aneurysm without rupture 04/19/2021   Congestive heart failure due to cardiomyopathy ([place]) 04/19/2021   Pulmonary hypertension, unspecified ([place]) 29/52/8413   Chronic systolic heart failure ([place]) 10/11/2020   Hardening of the aorta (main artery of the heart) ([place]) 10/11/2020   Hypercholesterolemia 10/11/2020   Peripheral venous insufficiency 10/11/2020   Pulmonary asbestosis ([place]) 10/11/2020   Pulmonary hypertension ([place]) 10/11/2020   Rheumatoid arthritis with rheumatoid factor of multiple sites without organ or systems involvement ([place]) 10/11/2020   Rheumatoid arthritis ([place]) 10/11/2020   Thrombophilia ([place]) 10/11/2020   Typical atrial flutter ([place]) 10/11/2020   Panlobular emphysema ([place]) 10/11/2020   Pulmonary fibrosis ([place]) 10/11/2020   AAA (abdominal aortic aneurysm)    Allergic rhinitis    Colon polyp    Hypertension    CAD (coronary artery disease) 12/02/2019   Cardiomyopathy ([place]) 12/02/2019   CHF (congestive heart failure) ([place]) 12/02/2019   Acute on chronic combined systolic and diastolic CHF (congestive heart failure) (HCC)    NSTEMI (non-ST elevated myocardial infarction) ([place])    Atrial fibrillation with RVR (HCC)    Chronic respiratory failure with hypoxia (HCC)    Dyspnea 09/12/2016   Chronic respiratory failure ([place]) 06/13/2016   Gastroesophageal reflux disease 06/13/2016   Restrictive airway disease 06/13/2016   Emphysema of lung ([place]) 04/26/2016   H/O asbestos exposure 04/26/2016   ILD (interstitial lung disease) ([place]) 04/26/2016   Essential hypertension 04/26/2016   Aortic aneurysm without rupture ([place]) 04/26/2016   Osteoarthritis 04/26/2016   Benign prostatic hyperplasia 04/26/2016   Chronic allergic rhinitis 04/26/2016   Heart murmur 04/26/2016    Past Surgical History:  Procedure Laterality  Date   CATARACT EXTRACTION, BILATERAL     COLONOSCOPY     HERNIA REPAIR     as child   REPLACEMENT TOTAL KNEE Left    RIGHT/LEFT HEART CATH AND CORONARY ANGIOGRAPHY N/A 11/16/2019   Procedure: RIGHT/LEFT HEART CATH AND CORONARY ANGIOGRAPHY;  Surgeon: Martinique, Peter M, MD;  Location: [facility];  Service: Cardiovascular;  Laterality: N/A;   TONSILLECTOMY         Family History  Problem Relation Age of Onset   Heart disease Mother    Leukemia Father    Lung disease Neg Hx    Rheumatologic disease Neg Hx     Social History   Tobacco Use   Smoking status: Former    Packs/day: 1.00    Years: 10.00    Pack years: 10.00    Types: Cigarettes, Pipe, Cigars    Start date: 10/02/1955    Quit date: 10/01/1965    Years since quitting: 55.9   Smokeless tobacco: Former    Types: Chew  Substance Use Topics   Alcohol use: No   Drug use: No    Home Medications Prior to Admission medications   Medication Sig Start Date End Date Taking? Authorizing Provider  acetaminophen (TYLENOL) 500 MG tablet Take 1,000 mg by mouth every 6 (six) hours as needed for moderate pain or headache.   Yes [provider]  apixaban (ELIQUIS) 5 MG TABS tablet Take 1 tablet (5 mg total) by mouth 2 (  two) times daily. 03/29/20  Yes Revankar, Reita Cliche, MD  atorvastatin (LIPITOR) 40 MG tablet Take 1 tablet (40 mg total) by mouth daily at 6 PM. 03/29/20  Yes Revankar, Reita Cliche, MD  famotidine (PEPCID) 20 MG tablet Take 20 mg by mouth daily as needed for heartburn or indigestion. 01/25/20  Yes [provider]  ferrous sulfate 324 MG TBEC Take 324 mg by mouth every Monday, Wednesday, and Friday.   Yes [provider]  fluticasone (FLONASE) 50 MCG/ACT nasal spray Place 2 sprays into both nostrils daily as needed for allergies (for seasonal allergies). 02/13/16  Yes [provider]  Javier Docker Oil 500 MG CAPS Take 500 mg by mouth daily with breakfast.   Yes [provider]  metolazone  (ZAROXOLYN) 2.5 MG tablet Take 1 tablet (2.5 mg total) by mouth daily. Patient taking differently: Take 2.5 mg by mouth daily as needed (weight gain over 3 pounds in 1 day). 03/06/21  Yes Revankar, Reita Cliche, MD  Multiple Vitamins-Minerals (ICAPS) CAPS Take 1 capsule by mouth daily with breakfast.   Yes [provider]  potassium chloride SA (KLOR-CON) 20 MEQ tablet Take 20 mEq by mouth daily. 05/10/21  Yes [provider]  torsemide (DEMADEX) 20 MG tablet Take 20 mg by mouth daily. 03/31/21  Yes [provider]  UNABLE TO FIND Take 1 capsule by mouth daily. Med Name: Super Beta Prostate   Yes [provider]    Allergies    Fosinopril, Lasix [furosemide], and Terbinafine hcl  Review of Systems   Review of Systems  Constitutional:  Negative for chills and fever.  HENT:  Negative for ear pain and sore throat.   Eyes:  Negative for pain and visual disturbance.  Respiratory:  Positive for shortness of breath. Negative for cough.   Cardiovascular:  Positive for chest pain and palpitations.  Gastrointestinal:  Negative for abdominal pain and vomiting.  Genitourinary:  Negative for dysuria and hematuria.  Musculoskeletal:  Negative for arthralgias and back pain.  Skin:  Negative for color change and rash.  Neurological:  Negative for seizures and syncope.  All other systems reviewed and are negative.  Physical Exam Updated Vital Signs BP 109/87   Pulse 86   Temp 97.9 F (36.6 C) (Oral)   Resp 19   SpO2 97%   Physical Exam Vitals and nursing note reviewed.  Constitutional:      General: He is not in acute distress.    Appearance: Normal appearance. He is well-developed.  HENT:     Head: Normocephalic and atraumatic.     Right Ear: External ear normal.     Left Ear: External ear normal.     Nose: Nose normal. No congestion.     Mouth/Throat:     Mouth: Mucous membranes are moist.     Pharynx: Oropharynx is clear. No posterior oropharyngeal erythema.   Eyes:     Extraocular Movements: Extraocular movements intact.     Conjunctiva/sclera: Conjunctivae normal.     Pupils: Pupils are equal, round, and reactive to light.  Cardiovascular:     Rate and Rhythm: Normal rate and regular rhythm.     Pulses: Normal pulses.     Heart sounds: No murmur heard. Pulmonary:     Effort: Pulmonary effort is normal. No respiratory distress.     Breath sounds: Normal breath sounds. No wheezing, rhonchi or rales.  Abdominal:     General: Abdomen is flat. Bowel sounds are normal.  Palpations: Abdomen is soft.     Tenderness: There is no abdominal tenderness. There is no guarding or rebound.  Musculoskeletal:        General: No swelling, tenderness or deformity. Normal range of motion.     Cervical back: Normal range of motion and neck supple. No rigidity.     Right lower leg: Edema (2+ to knee) present.     Left lower leg: Edema (2+ to knee) present.  Skin:    General: Skin is warm and dry.     Capillary Refill: Capillary refill takes less than 2 seconds.     Findings: No rash.  Neurological:     General: No focal deficit present.     Mental Status: He is alert and oriented to person, place, and time.  Psychiatric:        Mood and Affect: Mood normal.    ED Results / Procedures / Treatments   Labs (all labs ordered are listed, but only abnormal results are displayed) Labs Reviewed  CBC WITH DIFFERENTIAL/PLATELET - Abnormal; Notable for the following components:      Result Value   RBC 3.96 (*)    Hemoglobin 12.0 (*)    MCV 100.8 (*)    All other components within normal limits  COMPREHENSIVE METABOLIC PANEL - Abnormal; Notable for the following components:   Chloride 96 (*)    Glucose, Bld 128 (*)    BUN 33 (*)    Albumin 3.1 (*)    GFR, Estimated 59 (*)    All other components within normal limits  BRAIN NATRIURETIC PEPTIDE - Abnormal; Notable for the following components:   B Natriuretic Peptide 778.6 (*)    All other components  within normal limits  TROPONIN I (HIGH SENSITIVITY) - Abnormal; Notable for the following components:   Troponin I (High Sensitivity) 19 (*)    All other components within normal limits  RESP PANEL BY RT-PCR (FLU A&B, COVID) ARPGX2  MAGNESIUM  TSH  MAGNESIUM  COMPREHENSIVE METABOLIC PANEL  CBC  TROPONIN I (HIGH SENSITIVITY)    EKG EKG Interpretation  Date/Time:  Thursday August 24 2021 18:27:31 EST Ventricular Rate:  132 PR Interval:  63 QRS Duration: 99 QT Interval:  364 QTC Calculation: 540 R Axis:   34 Text Interpretation: Atrial fibrillation with rate increase Repolarization abnormality, prob rate related Prolonged QT interval Confirmed by Blanchie Dessert [phone]) on 08/24/2021 6:59:01 PM  Radiology DG Chest Portable 1 View  Result Date: 08/24/2021 CLINICAL DATA:  Chest pressure.  Shortness of breath. EXAM: PORTABLE CHEST 1 VIEW COMPARISON:  Multiple chest x-rays since 2013. FINDINGS: No pneumothorax. Known pleural plaques. Diffuse increased interstitial opacities, mildly increased in the interval. Stable cardiomegaly. Prominent tortuous thoracic aorta is poorly evaluated due to the portable technique but similar to previous studies. No other changes. IMPRESSION: 1. Increased interstitial markings in the lungs suggests edema versus atypical infection. Recommend clinical correlation. 2. Prominent tortuous thoracic aorta is similar to previous studies but poorly evaluated given today's technique. 3. Known calcified pleural plaques. 4. Known emphysematous changes in the lungs. Electronically Signed   By: Dorise Bullion III M.D.   On: 08/24/2021 19:10    Procedures Procedures   Medications Ordered in ED Medications  acetaminophen (TYLENOL) tablet 650 mg (has no administration in time range)    Or  acetaminophen (TYLENOL) suppository 650 mg (has no administration in time range)  labetalol (NORMODYNE) injection 5 mg (5 mg Intravenous Given 08/24/21 1928)  furosemide (LASIX)  injection  40 mg (40 mg Intravenous Given 08/24/21 2108)    ED Course  I have reviewed the triage vital signs and the nursing notes.  Pertinent labs & imaging results that were available during my care of the patient were reviewed by me and considered in my medical decision making (see chart for details).    MDM Rules/Calculators/A&P                          85 y/o male w/ couple history significant for CHF, A. fib on Eliquis, AAA, obstructive CAD not amenable to stenting, pulmonary hypertension, pulmonary fibrosis on chronic 3 L of O2 presenting with chest pressure and palpitations.  He was found to be in A. fib with RVR to 150s per EMS.  He was given Cardizem 20 mg in route.  He is hemodynamically stable on arrival.  Tachycardic to 130s.  EKG consistent with A. fib with RVR.  He was given an additional 5 mg of IV labetalol with improvement in heart rate to 90s.  Chest pain resolved w/ rate control. EKG did show trace ST depressions in the anterior leads, possibly rate related ischemia.  Initial troponin 19.  Awaiting delta troponin.  Patient does appear volume overloaded on exam.  His chest x-ray showed cardiomegaly with interstitial edema.  His BNP is elevated to the 700s.  Findings concerning for CHF exacerbation.  He was given 40 mg IV Lasix.  TSH is normal.  Mag is normal.  Hemoglobin stable at 12.  Creatinine 1.22, up from baseline of 0.8.  Discussed findings with patient and wife.  At this time, recommend admission for further management of volume overload.  They are agreeable with plan.  Medicine team contacted for admission.  Handoff given.   Final Clinical Impression(s) / ED Diagnoses Final diagnoses:  Atrial fibrillation with RVR ([place])  Acute on chronic congestive heart failure, unspecified heart failure type [facility])    Rx / DC Orders ED Discharge Orders     None        Idamae Lusher, MD 08/24/21 2129    Blanchie Dessert, MD 08/24/21 2240

## 2021-08-24 NOTE — ED Triage Notes (Signed)
Pt bib [facility] from home c/o CP and AFIB. Hx of Afib, on eliquis, Per EMS, pt started having cp 3hrs ago, non-radiating. A&O X4.   Eme given 20 Cardizem & 324 mg aspirin. HR 160 to 130. BP 124/74 93% 4L  CBG 130

## 2021-08-24 NOTE — H&P (Signed)
History and Physical    PLEASE NOTE THAT DRAGON DICTATION SOFTWARE WAS USED IN THE CONSTRUCTION OF THIS NOTE.   [name] AOZ:[id number] DOB: 1936-05-30 DOA: 08/24/2021  PCP: [name], MD  Patient coming from: home   I have personally briefly reviewed patient's old medical records in [place]  Chief Complaint: Shortness of breath  HPI: [name] is a 85 y.o. male with medical history significant for chronic diastolic heart failure, paroxysmal atrial fibrillation chronically anticoagulated on Eliquis, chronic hypoxic respiratory failure on continuous 3 L nasal cannula, hyperlipidemia, interstitial lung disease, who is admitted to [facility] on 08/24/2021 with acute on chronic diastolic heart failure after presenting from home to [facility] ED complaining of shortness of breath.   The patient reports 1 day of progressive shortness of breath associated with orthopnea, PND, worsening of edema in the bilateral lower extremities.  He notes that this is also associated with palpitations, and substernal, nonradiating chest pressure, that was nonexertional, nonpleuritic, nonpositional, not reproducible with direct palpation over the anterior chest wall.  Not associate with any diaphoresis, dizziness, nausea, vomiting, presyncope, or syncope.  No recent cough, wheezing, hemoptysis, new lower extremity erythema, or calf tenderness.  Denies any associated subjective fever, chills, rigors, or generalized myalgias.  Is a history of chronic diastolic heart failure, with most recent echocardiogram in June 2020 notable for LVEF 60 to 65%, with indeterminate diastolic parameters, with most recent echo prior to that performed in July 2017 demonstrating LVEF 60 to 96%, grade 1 diastolic dysfunction.  Patient reports good compliance with home diuretic regimen which consists of torsemide 20 mg p.o. daily, without any recent dose modifications to this medication.  He also has a known history of  coronary artery disease with left coronary angiography in February 2021 showing obstructive CAD that was not amenable to revascularization intervention, with cardiology subsequently recommending medical management.   Doses history of paroxysmal atrial fibrillation for which he is chronically anticoagulated on Eliquis.  Not on any AV nodal blocking agents at home, in the setting of some documentation of a history of first-degree AV nodal block.  No rhythm control pharmacologic intervention as an outpatient.  In the setting of progression of his shortness of breath and chest discomfort, patient contacted EMS, who noted the patient to be in atrial fibrillation with RVR and associated ventricular rates in the 150s, at which time EMS administered a single dose of IV diltiazem, slowing patient's heart rate into the 130s, with which he presented to [facility] emergency department today for further evaluation.   ED Course:  Vital signs in the ED were notable for the following: Afebrile; presenting heart rates in the 130s associated with atrial fibrillation with RVR, which decreased into the 90s to low 100s following labetalol 5 mg IV x1 and initiation of IV diuresis, as further detailed below.  With this improvement in rate control patient reports corresponding complete resolution of his chest discomfort.  Blood pressure 109/87 -130/94; respiratory rate 19-24, oxygen saturation 93 to 97% on his baseline continuous 3 L nasal cannula.  Labs were notable for the following: CMP notable for the following: Potassium 3.8, creatinine 1.22.  BNP 778, without prior BNP data point available for point comparison.  Initial high-sensitivity troponin I found to be 19, with repeat value trending up to 63, which is relative to most recent prior high-sensitivity troponin I value of 571 in February 2021.  COVID-19/influenza PCR were checked in the ED today and found to  be negative.  Imaging and additional notable ED work-up: EKG,  in comparison to most recent prior from July 2022 showed atrial fibrillation with RVR, ventricular rate 132, no evidence of T wave changes, less than 1 mm ST depression in leads V4, V5, V6, of which less than 1 mm ST depression was also noted on most recent prior EKG from July 2022.  Chest x-ray showed increased interstitial markings suggestive of edema, with atypical infection felt to be less likely.  Additionally, chest x-ray showed no evidence of effusion or pneumothorax.  While in the ED, the following were administered: Labetalol 5 mg IV x1, Lasix 40 mg IV x1.     Review of Systems: As per HPI otherwise 10 point review of systems negative.   Past Medical History:  Diagnosis Date   AAA (abdominal aortic aneurysm) (HCC)    Acute on chronic combined systolic and diastolic CHF (congestive heart failure) (HCC)    Allergic rhinitis    Aortic aneurysm without rupture ([place]) 04/26/2016   Atrial fibrillation with RVR (HCC)    Benign prostatic hyperplasia 04/26/2016   CAD (coronary artery disease) 12/02/2019   Cardiomyopathy ([place]) 12/02/2019   CHF (congestive heart failure) ([place]) 12/02/2019   Chronic allergic rhinitis 04/26/2016   Chronic respiratory failure ([place]) 06/13/2016   Chronic respiratory failure with hypoxia (HCC)    Chronic systolic heart failure ([place]) 10/11/2020   Colon polyp    Congestive heart failure due to cardiomyopathy ([place]) 04/19/2021   Dyspnea 09/12/2016   Emphysema of lung ([place]) 04/26/2016   Essential hypertension 04/26/2016   Gastroesophageal reflux disease 06/13/2016   Gout 04/19/2021   H/O asbestos exposure 04/26/2016   Hardening of the aorta (main artery of the heart) ([place]) 10/11/2020   Heart murmur 04/26/2016   Hypercholesterolemia 10/11/2020   Hypertension    ILD (interstitial lung disease) ([place]) 04/26/2016   Long term (current) use of anticoagulants 04/19/2021   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 [name], [place] emphysema ([place]) 10/11/2020   Peripheral venous insufficiency 10/11/2020   Psychosexual dysfunction with inhibited sexual excitement 04/19/2021   Pulmonary asbestosis ([place]) 10/11/2020   Pulmonary fibrosis ([place]) 10/11/2020   Pulmonary hypertension ([place]) 10/11/2020   Pulmonary hypertension due to interstitial lung disease ([place]) 04/19/2021   Pulmonary hypertension, unspecified ([place]) 04/19/2021   Restrictive airway disease 06/13/2016   Rheumatoid arthritis ([place]) 10/11/2020   Rheumatoid arthritis with rheumatoid factor of multiple sites without organ or systems involvement ([place]) 10/11/2020   Thoracic aortic aneurysm without rupture ([place]) 04/19/2021   Dec 25, 2017 Entered By: Thermon Leyland Comment: Private surveillance.   Thrombophilia ([place]) 10/11/2020   Typical atrial flutter ([place]) 10/11/2020    Past Surgical History:  Procedure Laterality Date   CATARACT EXTRACTION, BILATERAL     COLONOSCOPY     HERNIA REPAIR     as child   REPLACEMENT TOTAL KNEE Left  RIGHT/LEFT HEART CATH AND CORONARY ANGIOGRAPHY N/A 11/16/2019   Procedure: RIGHT/LEFT HEART CATH AND CORONARY ANGIOGRAPHY;  Surgeon: Martinique, Peter M, MD;  Location: [facility];  Service: Cardiovascular;  Laterality: N/A;   TONSILLECTOMY      Social History:  reports that he quit smoking about 55 years ago. His smoking use included cigarettes, pipe, and  cigars. He started smoking about 65 years ago. He has a 10.00 pack-year smoking history. He has quit using smokeless tobacco.  His smokeless tobacco use included chew. He reports that he does not drink alcohol and does not use drugs.   Allergies  Allergen Reactions   Fosinopril Other (See Comments)    Other reaction(s): Cough   Lasix [Furosemide] Diarrhea and Other (See Comments)    Additionally, this made the patient become dehydrated (eventually)   Terbinafine Hcl Other (See Comments)    Family History  Problem Relation Age of Onset   Heart disease Mother    Leukemia Father    Lung disease Neg Hx    Rheumatologic disease Neg Hx     Family history reviewed and not pertinent    Prior to Admission medications   Medication Sig Start Date End Date Taking? Authorizing Provider  acetaminophen (TYLENOL) 650 MG CR tablet Take 650 mg by mouth every 8 (eight) hours as needed for pain (or headaches).     [provider]  apixaban (ELIQUIS) 5 MG TABS tablet Take 1 tablet (5 mg total) by mouth 2 (two) times daily. 03/29/20   Revankar, Reita Cliche, MD  atorvastatin (LIPITOR) 40 MG tablet Take 1 tablet (40 mg total) by mouth daily at 6 PM. 03/29/20   Revankar, Reita Cliche, MD  famotidine (PEPCID) 20 MG tablet Take 20 mg by mouth daily. 01/25/20   [provider]  fluticasone (FLONASE) 50 MCG/ACT nasal spray Place 2 sprays into both nostrils daily as needed for allergies (for seasonal allergies). 02/13/16   [provider]  Javier Docker Oil 350 MG CAPS Take 350 mg by mouth daily with breakfast.     [provider]  metolazone (ZAROXOLYN) 2.5 MG tablet Take 1 tablet (2.5 mg total) by mouth daily. 03/06/21   Revankar, Reita Cliche, MD  Multiple Vitamins-Minerals (ICAPS) CAPS Take 1 capsule by mouth daily with breakfast.    [provider]  torsemide (DEMADEX) 20 MG tablet Take 20 mg by mouth daily. 03/31/21   [provider]  triamcinolone cream (KENALOG) 0.5 % Apply 1  application topically daily. 03/26/21   [provider]  UNABLE TO FIND Take 1 capsule by mouth daily. Med Name: Super Beta Prostate    [provider]     Objective    Physical Exam: Vitals:   08/24/21 1915 08/24/21 1945 08/24/21 2016 08/24/21 2019  BP: 102/72 108/88 109/87   Pulse: (!) 33 92 (!) 54 86  Resp: (!) 24 (!) 24 (!) 21 19  Temp:      TempSrc:      SpO2: 95% 97% 91% 97%    General: appears to be stated age; alert, oriented; mildly increased work of breathing noted Skin: warm, dry, no rash Head:  AT/[place] Mouth:  Oral mucosa membranes appear moist, normal dentition Neck: supple; trachea midline Heart:  RRR; did not appreciate any M/R/G Lungs: Bibasilar crackles noted, but otherwise, CTAB, did not appreciate any wheezes, or rhonchi Abdomen: + BS; soft, ND, NT Vascular: 2+ pedal pulses b/l; 2+ radial pulses b/l Extremities: 2+ edema in the bilateral lower  extremities, no muscle wasting Neuro: strength and sensation intact in upper and lower extremities b/l   Labs on Admission: I have personally reviewed following labs and imaging studies  CBC: Recent Labs  Lab 08/24/21 1840  WBC 9.7  NEUTROABS 7.0  HGB 12.0*  HCT 39.9  MCV 100.8*  PLT 935   Basic Metabolic Panel: Recent Labs  Lab 08/24/21 1840  NA 137  K 3.8  CL 96*  CO2 31  GLUCOSE 128*  BUN 33*  CREATININE 1.22  CALCIUM 8.9  MG 1.8   GFR: CrCl cannot be calculated (Unknown ideal weight.). Liver Function Tests: Recent Labs  Lab 08/24/21 1840  AST 17  [name] 9  [name] 60  [name] 0.7  [name] 7.6  ALBUMIN 3.1*   No results for input(s): LIPASE, [name], [name], [name], [name], TROPONINI in the last 168 hours. BNP (last 3 results) No results for input(s): PROBNP in the  last 8760 hours. HbA1C: No results for input(s): HGBA1C in the last 72 hours. CBG: No results for input(s): GLUCAP in the last 168 hours. Lipid Profile: No results for input(s): CHOL, HDL, LDLCALC, TRIG, CHOLHDL, LDLDIRECT in the last 72 hours. Thyroid Function Tests: Recent Labs    08/24/21 1841  TSH 2.779   Anemia Panel: No results for input(s): VITAMINB12, FOLATE, FERRITIN, [name], IRON, [name], [name], [place], [place], [name], [name], [name], [name], [name], [name], NITRITE, LEUKOCYTESUR  Radiological Exams on Admission: DG Chest Portable 1 View  Result Date: 08/24/2021 CLINICAL DATA:  Chest pressure.  Shortness of breath. EXAM: PORTABLE CHEST 1 VIEW COMPARISON:  Multiple chest x-rays since 2013. FINDINGS: No pneumothorax. Known pleural plaques. Diffuse increased interstitial opacities, mildly increased in the interval. Stable cardiomegaly. Prominent tortuous thoracic aorta is poorly evaluated due to the portable technique but similar to previous studies. No other changes. IMPRESSION: 1. Increased interstitial markings in the lungs suggests edema versus atypical infection. Recommend clinical correlation. 2. Prominent tortuous thoracic aorta is similar to previous studies but poorly evaluated given today's technique. 3. Known calcified pleural plaques. 4. Known emphysematous changes in the lungs. Electronically Signed   By: Dorise Bullion III M.D.   On: 08/24/2021 19:10     EKG: Independently reviewed, with result as described above.    Assessment/Plan   Principal Problem:   Acute on chronic systolic (congestive) heart failure (HCC) Active Problems:   Atrial fibrillation with RVR (HCC)   Chronic respiratory failure with hypoxia (HCC)   Hypercholesterolemia   Atypical chest pain   Elevated troponin      #) Acute on chronic diastolic heart failure: dx of acute decompensation  on the basis of presenting progressive shortness of breath associate with orthopnea, PND, worsening of edema in the bilateral lower extremities, elevated BNP, and chest x-ray demonstrating increase in interstitial markings to be most consistent with edema. This is in the context of a known history of chronic diastolic heart failure, with most recent echocardiogram performed in June 2020 notable for LVEF 60 to 65%, with indeterminate diastolic parameters, with echo in July 2017 demonstrating grade 1 diastolic dysfunction.  While it is possible that the patient went into atrial fibrillation with RVR causing his acute on chronic diastolic heart failure exacerbation as a consequence of relative decline  in stroke-volume due to diminished diastolic filling time as well as associated loss of atrial kick, it appears more likely patient's acute on chronic diastolic heart failure was a primary event, resulting in development of atrial fibrillation with RVR.   I suspect that mildly elevated initial troponin is a consequence of underlying acutely decompensated heart failure as opposed to representing ACS causing presenting acute heart failure exacerbation,  and with presenting EKG showing no evidence of acute ischemic changes. However, will continue to evaluate with further trending of troponin and close monitoring on tele. Patient conveys good compliance with home diuretic therapy, which consists of torsemide 20 mg p.o. daily.   Of note, patient received Lasix 40 mg IV x1 while in the ED today. Presentation warrants additional IV diuresis, as further detailed below, with close monitoring of ensuing renal function, electrolytes, and volume status. Will also closely monitor ensuing HR as an additional means to evaluate volume status and help guide subsequent diuresis decision-making.   In terms of specific dose of IV Lasix: Outpatient torsemide 20 mg p.o. daily as equivalent to Lasix 40 mg IV daily.  Consequently, will  initiate Lasix 40 mg IV twice daily.     Plan: monitor strict I's & O's and daily weights. Monitor on telemetry, including trend in HR in response to diuresis, as above. Monitor continuous pulse oximetry. Repeat BMP in the morning, including for monitoring trend of potassium, bicarbonate, and renal function in response to interval diuresis efforts. Add-on serum magnesium level, and repeat this level in the AM. Close monitoring of ensuing blood pressure response to diuresis efforts, including to help guide need for improvement in afterload reduction in order to optimize cardiac output. Trend troponin. Lasix 40 mg IV twice daily.  Potassium chloride 20 mEq p.o. x1 dose now followed by resumption of home potassium chloride 20 mEq p.o. daily.  Check echocardiogram, as it has been almost 2-1/2 years since most recent prior, with interval exacerbation of underlying heart failure.  Check procalcitonin to further rule out less likely possibility of atypical infection.      #) Atrial fibrillation with RVR: In the setting of a known history of paroxysmal atrial fibrillation, the patient presents today in atrial fibrillation with ventricular rates in the 150's, which has improved intot the 90's following 2 doses of IV AV nodal blocking agents, corresponding with improvement in presenting chest discomfort.  Of note, blood pressure appears to be tolerating these rates. In terms of factors leading to this exacerbation: Suspect contribution from acute on chronic diastolic heart failure, as above.  Presenting EKG, in comparison to most recent prior from July 2022 shows nonspecific less than 1 mm ST depression in V4, with plan to repeat EKG following improvement in rate control to evaluate for any rate related changes. In the setting of a CHA2DS2-VASc score of 5, there is an indication for the patient to be on chronic anticoagulation for thromboembolic prophylaxis. Consistent with this, the patient is chronically  anticoagulated on Eliquis. Home AV nodal blocking regimen: None.     Plan: Monitor strict I's and O's and daily weights. Monitor on telemetry. Check serum magnesium level.  Potassium supplementation, as above.  Repeat BMP in the AM.  Repeat CBC in the AM.  Further evaluation management presenting acute on chronic diastolic heart failure, including additional IV diuresis, as outlined above.  Echocardiogram, as above.  EKG in AM.  Check urinalysis.  Troponin in a.m.        #) Atypical chest pain: Nonexertional  chest pain that occurred while in atrial fibrillation with RVR, with corresponding complete resolution of chest discomfort with improving rate control.  Suggestive of potential to demand ischemia that is rate related in the context of a known history of coronary artery disease with most recent left-sided coronary angiography demonstrating obstructive disease that was not amenable to revascularization, with ensuing recommendations from cardiology for medical management.  No evidence of ST elevation on presenting EKG.  Plan: Evaluation management of acute on chronic diastolic heart failure as well as presenting atrial fibrillation with RVR, as above.  Monitor on telemetry.  Repeat EKG.  Continue to trend troponin.  Continue home high intensity atorvastatin.  Echo in the a.m.      #) Chronic hypoxic respiratory failure: In the setting of a history of interstitial lung disease, baseline supplemental oxygen requirement.  To be continuous 3 L nasal cannula.  Appears to be at baseline at this time.   Plan: Monitor continuous pulse oximetry, particularly given presenting acute on chronic diastolic heart failure.  Monitor on telemetry.  Monitor strict I's and O's and daily weights.  Trend troponin and repeat EKG, as above.        #) Hyperlipidemia: High intensity atorvastatin as an outpatient.  Plan: Continue home statin.    DVT prophylaxis: Eliquis  Code Status: DNR (per my  discussions with the patient as well as his wife this evening) Family Communication: Case discussed with the patient's wife, who is present at bedside Disposition Plan: Per Rounding Team Consults called: none;  Admission status: Inpatient; pcu  Warrants inpatient status on basis of need for additional IV diuresis associated with close trending of interval renal function and monitoring, associated electrolytes setting of acute on chronic diastolic heart failure, in addition to close monitoring of rate control in the context of presenting atrial fibrillation with RVR.   PLEASE NOTE THAT DRAGON DICTATION SOFTWARE WAS USED IN THE CONSTRUCTION OF THIS NOTE.   [place] DO [facility]  From [place]   08/24/2021, 8:49 PM

## 2021-08-25 ENCOUNTER — Encounter (HOSPITAL_COMMUNITY): Payer: Self-pay | Admitting: Internal Medicine

## 2021-08-25 ENCOUNTER — Inpatient Hospital Stay (HOSPITAL_COMMUNITY): Payer: Medicare Other

## 2021-08-25 DIAGNOSIS — L8990 Pressure ulcer of unspecified site, unspecified stage: Secondary | ICD-10-CM

## 2021-08-25 DIAGNOSIS — R0789 Other chest pain: Secondary | ICD-10-CM

## 2021-08-25 DIAGNOSIS — I5033 Acute on chronic diastolic (congestive) heart failure: Secondary | ICD-10-CM

## 2021-08-25 DIAGNOSIS — R778 Other specified abnormalities of plasma proteins: Secondary | ICD-10-CM

## 2021-08-25 DIAGNOSIS — I4891 Unspecified atrial fibrillation: Secondary | ICD-10-CM

## 2021-08-25 DIAGNOSIS — R7989 Other specified abnormal findings of blood chemistry: Secondary | ICD-10-CM

## 2021-08-25 DIAGNOSIS — I509 Heart failure, unspecified: Secondary | ICD-10-CM

## 2021-08-25 HISTORY — DX: Other chest pain: R07.89

## 2021-08-25 HISTORY — DX: Acute on chronic diastolic (congestive) heart failure: I50.33

## 2021-08-25 HISTORY — DX: Pressure ulcer of unspecified site, unspecified stage: L89.90

## 2021-08-25 HISTORY — DX: Other specified abnormal findings of blood chemistry: R79.89

## 2021-08-25 HISTORY — DX: Other specified abnormalities of plasma proteins: R77.8

## 2021-08-25 LAB — CBC
HCT: 35.5 % — ABNORMAL LOW (ref 39.0–52.0)
Hemoglobin: 10.9 g/dL — ABNORMAL LOW (ref 13.0–17.0)
MCH: 30.2 pg (ref 26.0–34.0)
MCHC: 30.7 g/dL (ref 30.0–36.0)
MCV: 98.3 fL (ref 80.0–100.0)
Platelets: 220 10*3/uL (ref 150–400)
RBC: 3.61 MIL/uL — ABNORMAL LOW (ref 4.22–5.81)
RDW: 15.3 % (ref 11.5–15.5)
WBC: 11.2 10*3/uL — ABNORMAL HIGH (ref 4.0–10.5)
nRBC: 0 % (ref 0.0–0.2)

## 2021-08-25 LAB — COMPREHENSIVE METABOLIC PANEL
ALT: 11 U/L (ref 0–44)
AST: 14 U/L — ABNORMAL LOW (ref 15–41)
Albumin: 2.8 g/dL — ABNORMAL LOW (ref 3.5–5.0)
Alkaline Phosphatase: 50 U/L (ref 38–126)
Anion gap: 9 (ref 5–15)
BUN: 32 mg/dL — ABNORMAL HIGH (ref 8–23)
CO2: 33 mmol/L — ABNORMAL HIGH (ref 22–32)
Calcium: 8.7 mg/dL — ABNORMAL LOW (ref 8.9–10.3)
Chloride: 98 mmol/L (ref 98–111)
Creatinine, Ser: 1.14 mg/dL (ref 0.61–1.24)
GFR, Estimated: >60 mL/min (ref >60)
Glucose, Bld: 81 mg/dL (ref 70–99)
Potassium: 3.3 mmol/L — ABNORMAL LOW (ref 3.5–5.1)
Sodium: 140 mmol/L (ref 135–145)
Total Bilirubin: 0.8 mg/dL (ref 0.3–1.2)
Total Protein: 6.8 g/dL (ref 6.5–8.1)

## 2021-08-25 LAB — URINALYSIS, COMPLETE (UACMP) WITH MICROSCOPIC
Bilirubin Urine: NEGATIVE
Glucose, UA: NEGATIVE mg/dL
Hgb urine dipstick: NEGATIVE
Ketones, ur: NEGATIVE mg/dL
Leukocytes,Ua: NEGATIVE
Nitrite: NEGATIVE
Protein, ur: NEGATIVE mg/dL
Specific Gravity, Urine: 1.012 (ref 1.005–1.030)
pH: 5 (ref 5.0–8.0)

## 2021-08-25 LAB — PROCALCITONIN: Procalcitonin: <0.1 ng/mL

## 2021-08-25 LAB — ECHOCARDIOGRAM COMPLETE
AR max vel: 1.41 cm2
AV Area VTI: 1.17 cm2
AV Area mean vel: 1.34 cm2
AV Mean grad: 14.2 mmHg
AV Peak grad: 23.2 mmHg
Ao pk vel: 2.41 m/s
Area-P 1/2: 5.62 cm2
Height: 75 in
P 1/2 time: 439 msec
S' Lateral: 4.3 cm
Weight: 3710.78 oz

## 2021-08-25 LAB — MAGNESIUM: Magnesium: 1.7 mg/dL (ref 1.7–2.4)

## 2021-08-25 LAB — TROPONIN I (HIGH SENSITIVITY): Troponin I (High Sensitivity): 91 ng/L — ABNORMAL HIGH (ref <18)

## 2021-08-25 MED ORDER — FUROSEMIDE 10 MG/ML IJ SOLN
40.0000 mg | Freq: Two times a day (BID) | INTRAMUSCULAR | Status: DC
  Administered 2021-08-25 – 2021-08-26 (×3): 40 mg via INTRAVENOUS
  Filled 2021-08-25 (×3): qty 4

## 2021-08-25 MED ORDER — APIXABAN 5 MG PO TABS
5.0000 mg | ORAL_TABLET | Freq: Two times a day (BID) | ORAL | Status: DC
  Administered 2021-08-25 – 2021-08-28 (×7): 5 mg via ORAL
  Filled 2021-08-25 (×8): qty 1

## 2021-08-25 MED ORDER — ATORVASTATIN CALCIUM 40 MG PO TABS
40.0000 mg | ORAL_TABLET | Freq: Every day | ORAL | Status: DC
  Administered 2021-08-25 – 2021-08-27 (×3): 40 mg via ORAL
  Filled 2021-08-25 (×3): qty 1

## 2021-08-25 MED ORDER — POTASSIUM CHLORIDE CRYS ER 20 MEQ PO TBCR
20.0000 meq | EXTENDED_RELEASE_TABLET | Freq: Once | ORAL | Status: AC
  Administered 2021-08-25: 20 meq via ORAL
  Filled 2021-08-25: qty 1

## 2021-08-25 MED ORDER — POTASSIUM CHLORIDE CRYS ER 20 MEQ PO TBCR
20.0000 meq | EXTENDED_RELEASE_TABLET | Freq: Every day | ORAL | Status: DC
  Administered 2021-08-25 – 2021-08-28 (×4): 20 meq via ORAL
  Filled 2021-08-25 (×5): qty 1

## 2021-08-25 NOTE — Progress Notes (Signed)
PROGRESS NOTE    [name]  YSA:[id number] DOB: 06-Dec-1935 DOA: 08/24/2021 PCP: [name], MD   Brief Narrative:  [name] is a 85 y.o. male with medical history significant for chronic diastolic heart failure, PAF anticoagulated on Eliquis, chronic hypoxic respiratory failure on continuous 3 L nasal cannula, hyperlipidemia, ILD, who is admitted to [facility] on 08/24/2021 with acute on chronic diastolic heart failure after presenting from home to [facility] ED complaining of shortness of breath. echocardiogram in June 2020 notable for LVEF 60 to 65%,  Patient reports good compliance with home diuretic regimen which consists of torsemide 20 mg p.o. daily, without any recent dose modifications to this medication.  He also has a known history of coronary artery disease with left coronary angiography in February 2021 showing obstructive CAD that was not amenable to revascularization intervention, with cardiology subsequently recommending medical management.   Vital signs in the ED were notable for the following: Afebrile; presenting heart rates in the 130s associated with atrial fibrillation with RVR, which decreased into the 90s to low 100s following labetalol 5 mg IV x1 and initiation of IV diuresis, as further detailed below.  With this improvement in rate control patient reports corresponding complete resolution of his chest discomfort.  Chest x-ray showed increased interstitial markings suggestive of edema, with atypical infection felt to be less likely.  Additionally, chest x-ray showed no evidence of effusion or pneumothorax.   Assessment & Plan:   Active Problems:   Atrial fibrillation with RVR (HCC)   Chronic respiratory failure with hypoxia (HCC)   Hypercholesterolemia   Atypical chest pain   Elevated troponin   Acute on chronic diastolic CHF (congestive heart failure) (HCC)   Pressure injury of skin  Acute on chronic diastolic congestive heart failure/chronic hypoxic  respiratory failure: Although requiring 4 L oxygen which is his baseline but he appears to be having acute congestive heart failure based off of positive pulmonary edema on the chest x-ray, elevated BNP and crackles on the lung exam.  We will continue Lasix 40 mg IV twice daily, strict I's and O's, daily weight and low-sodium diet with 1500 cc fluid restriction.  Paroxysmal atrial fibrillation: Presented with RVR, rates well controlled after IV diuresis.  Rates are controlled now.  He is not on any rate control medications.  Continue Eliquis.  Prior history of CAD with chest pain: His chest pain resolved in the ED and has not recurred.  Troponins only slightly elevated.  Echo pending.  Monitor on telemetry.  Hyperlipidemia: Continue atorvastatin.  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  DVT prophylaxis: SCDs Start: 08/24/21 2048   Code Status: DNR  Family Communication: Patient's wife present at bedside, plan of care discussed with her and the patient both and verbalized understanding.    Status is: Inpatient  Remains inpatient appropriate because: Needs IV diuresis.  Estimated body mass index is 28.99 kg/m as calculated from the following:   Height as of this encounter: _0  (1.905 m).   Weight as of this encounter: 105.2 kg.  Pressure Injury 08/14/21 Buttocks Medial Stage 1 -  Intact skin with non-blanchable redness of a localized area usually over a bony prominence. (Active)  08/14/21 2300  Location: Buttocks  Location Orientation: Medial  Staging: Stage 1 -  Intact skin with non-blanchable redness of a localized area usually over a bony prominence.  Wound Description (Comments):   Present on Admission: Yes    Nutritional Assessment: Body  mass index is 28.99 kg/m.Marland Kitchen Seen by dietician.  I agree with the assessment and plan as outlined below: Nutrition Status:   Skin Assessment: I have examined the patient's skin and I agree with the  wound assessment as performed by the wound care RN as outlined below: Pressure Injury 08/14/21 Buttocks Medial Stage 1 -  Intact skin with non-blanchable redness of a localized area usually over a bony prominence. (Active)  08/14/21 2300  Location: Buttocks  Location Orientation: Medial  Staging: Stage 1 -  Intact skin with non-blanchable redness of a localized area usually over a bony prominence.  Wound Description (Comments):   Present on Admission: Yes    Consultants:  None  Procedures:  None  Antimicrobials:  Anti-infectives (From admission, onward)    None          Subjective: Patient seen and examined.  He states that his breathing is better than yesterday.  Wife at the bedside.  Objective: Vitals:   08/24/21 2019 08/24/21 2320 08/25/21 0100 08/25/21 0833  BP:  104/75  (!) 153/136  Pulse: 86 (!) 101  85  Resp: 19   17  Temp:  97.8 F (36.6 C)  97.8 F (36.6 C)  TempSrc:  Oral  Oral  SpO2: 97% 93%  90%  Weight:   105.2 kg   Height:   _0  (1.905 m)     Intake/Output Summary (Last 24 hours) at 08/25/2021 0951 Last data filed at 08/25/2021 1103 Gross per 24 hour  Intake 360 ml  Output 600 ml  Net -240 ml   Filed Weights   08/25/21 0100  Weight: 105.2 kg    Examination:  General exam: Appears calm and comfortable  Respiratory system: Rhonchi and crackles bilaterally. Respiratory effort normal. Cardiovascular system: S1 & S2 heard, RRR. No JVD, murmurs, rubs, gallops or clicks.  +1 pitting edema bilateral lower extremity Gastrointestinal system: Abdomen is nondistended, soft and nontender. No organomegaly or masses felt. Normal bowel sounds heard. Central nervous system: Alert and oriented. No focal neurological deficits. Extremities: Symmetric 5 x 5 power. Skin: No rashes, lesions or ulcers Psychiatry: Judgement and insight appear normal. Mood & affect appropriate.    Data Reviewed: I have personally reviewed following labs and imaging  studies  CBC: Recent Labs  Lab 08/24/21 1840 08/25/21 0328  WBC 9.7 11.2*  NEUTROABS 7.0  --   HGB 12.0* 10.9*  HCT 39.9 35.5*  MCV 100.8* 98.3  PLT 242 159   Basic Metabolic Panel: Recent Labs  Lab 08/24/21 1840 08/25/21 0328  NA 137 140  K 3.8 3.3*  CL 96* 98  CO2 31 33*  GLUCOSE 128* 81  BUN 33* 32*  CREATININE 1.22 1.14  CALCIUM 8.9 8.7*  MG 1.8 1.7   GFR: Estimated Creatinine Clearance: 64.4 mL/min (by C-G formula based on SCr of 1.14 mg/dL). Liver Function Tests: Recent Labs  Lab 08/24/21 1840 08/25/21 0328  AST 17 14*  [name] 9 11  [name] 60 50  [name] 0.7 0.8  [name] 7.6 6.8  ALBUMIN 3.1* 2.8*   No results for input(s): LIPASE, [name], [name], [name], [name], TROPONINI in the last 168 hours. BNP (last 3 results) No results for input(s): PROBNP in the last 8760 hours. HbA1C: No results for input(s): HGBA1C in the last 72 hours.  CBG: No results for input(s): GLUCAP in the last 168 hours. Lipid Profile: No results for input(s): CHOL, HDL, LDLCALC, TRIG, CHOLHDL, LDLDIRECT in the last 72 hours. Thyroid Function Tests: Recent Labs    08/24/21 1841  TSH 2.779   Anemia Panel: No results for input(s): VITAMINB12, FOLATE, FERRITIN, [name], IRON, RETICCTPCT in the last 72 hours. Sepsis Labs: Recent Labs  Lab 08/25/21 0328  PROCALCITON <0.10    Recent Results (from the past 240 hour(s))  Resp Panel by RT-PCR (Flu A&B, Covid) Nasopharyngeal Swab     Status: None   Collection Time: 08/24/21  8:45 PM   Specimen: Nasopharyngeal Swab; Nasopharyngeal(NP) swabs in vial transport medium  Result Value Ref Range Status   SARS Coronavirus 2 by RT PCR NEGATIVE NEGATIVE Final    Comment: (NOTE) SARS-CoV-2 target nucleic acids are NOT DETECTED.  The  SARS-CoV-2 RNA is generally detectable in upper respiratory specimens during the acute phase of infection. The lowest concentration of SARS-CoV-2 viral copies this assay can detect is 138 copies/mL. A negative result does not preclude SARS-Cov-2 infection and should not be used as the sole basis for treatment or other patient management decisions. A negative result may occur with  improper specimen collection/handling, submission of specimen other than nasopharyngeal swab, presence of viral mutation(s) within the areas targeted by this assay, and inadequate number of viral copies(<138 copies/mL). A negative result must be combined with clinical observations, patient history, and epidemiological information. The expected result is Negative.  Fact Sheet for Patients:  EntrepreneurPulse.com.au  Fact Sheet for Healthcare Providers:  IncredibleEmployment.be  This test is no t yet approved or cleared by the [place] FDA and  has been authorized for detection and/or diagnosis of SARS-CoV-2 by FDA under an Emergency Use Authorization (EUA). This EUA will remain  in effect (meaning this test can be used) for the duration of the COVID-19 declaration under Section 564(b)(1) of the Act, 21 U.S.C.section 360bbb-3(b)(1), unless the authorization is terminated  or revoked sooner.       Influenza A by PCR NEGATIVE NEGATIVE Final   Influenza B by PCR NEGATIVE NEGATIVE Final    Comment: (NOTE) The Xpert Xpress SARS-CoV-2/FLU/RSV plus assay is intended as an aid in the diagnosis of influenza from Nasopharyngeal swab specimens and should not be used as a sole basis for treatment. Nasal washings and aspirates are unacceptable for Xpert Xpress SARS-CoV-2/FLU/RSV testing.  Fact Sheet for Patients: EntrepreneurPulse.com.au  Fact Sheet for Healthcare Providers: IncredibleEmployment.be  This test is not yet approved or  cleared by the [place] FDA and has been authorized for detection and/or diagnosis of SARS-CoV-2 by FDA under an Emergency Use Authorization (EUA). This EUA will remain in effect (meaning this test can be used) for the duration of the COVID-19 declaration under Section 564(b)(1) of the Act, 21 U.S.C. section 360bbb-3(b)(1), unless the authorization is terminated or revoked.  Performed at [facility], [place] [address] [place], [place] [address]       Radiology Studies: DG Chest Portable 1 View  Result Date: 08/24/2021 CLINICAL DATA:  Chest pressure.  Shortness of breath. EXAM: PORTABLE CHEST 1 VIEW COMPARISON:  Multiple chest x-rays since 2013. FINDINGS: No pneumothorax. Known pleural plaques. Diffuse increased interstitial opacities, mildly increased in the interval. Stable cardiomegaly. Prominent tortuous thoracic aorta is poorly evaluated due to the portable technique but similar to previous studies. No other changes. IMPRESSION: 1. Increased interstitial markings in the lungs suggests edema versus atypical infection. Recommend clinical correlation. 2. Prominent  tortuous thoracic aorta is similar to previous studies but poorly evaluated given today's technique. 3. Known calcified pleural plaques. 4. Known emphysematous changes in the lungs. Electronically Signed   By: Dorise Bullion III M.D.   On: 08/24/2021 19:10    Scheduled Meds:  apixaban  5 mg Oral BID   atorvastatin  40 mg Oral q1800   furosemide  40 mg Intravenous BID   potassium chloride SA  20 mEq Oral Daily   Continuous Infusions:   LOS: 1 day   Time spent: 35 minutes   Darliss Cheney, MD [facility]  08/25/2021, 9:51 AM  Please page via [place]a [place] and do not message via secure chat for anything urgent. Secure chat can be used for anything non urgent.  How to contact the [facility] Attending or Consulting provider [place] or covering provider during after hours [place], for this patient?  Check the care team in  [facility] and look for a) attending/consulting [facility] provider listed and b) the [facility] team listed. Page or secure chat [phone]. Log into [email or web address] and use [place]'s universal password to access. If you do not have the password, please contact the hospital operator. Locate the [facility] provider you are looking for under [facility] and page to a number that you can be directly reached. If you still have difficulty reaching the provider, please page the [facility] (Director on Call) for the Hospitalists listed on amion for assistance.

## 2021-08-26 ENCOUNTER — Encounter (HOSPITAL_COMMUNITY): Payer: Self-pay | Admitting: Internal Medicine

## 2021-08-26 DIAGNOSIS — I5041 Acute combined systolic (congestive) and diastolic (congestive) heart failure: Secondary | ICD-10-CM

## 2021-08-26 LAB — TROPONIN I (HIGH SENSITIVITY): Troponin I (High Sensitivity): 40 ng/L — ABNORMAL HIGH (ref <18)

## 2021-08-26 LAB — BASIC METABOLIC PANEL
Anion gap: 17 — ABNORMAL HIGH (ref 5–15)
BUN: 26 mg/dL — ABNORMAL HIGH (ref 8–23)
CO2: 30 mmol/L (ref 22–32)
Calcium: 8.6 mg/dL — ABNORMAL LOW (ref 8.9–10.3)
Chloride: 91 mmol/L — ABNORMAL LOW (ref 98–111)
Creatinine, Ser: 0.95 mg/dL (ref 0.61–1.24)
GFR, Estimated: >60 mL/min (ref >60)
Glucose, Bld: 103 mg/dL — ABNORMAL HIGH (ref 70–99)
Potassium: 3.6 mmol/L (ref 3.5–5.1)
Sodium: 138 mmol/L (ref 135–145)

## 2021-08-26 MED ORDER — SACUBITRIL-VALSARTAN 24-26 MG PO TABS
1.0000 | ORAL_TABLET | Freq: Two times a day (BID) | ORAL | Status: DC
  Administered 2021-08-26 – 2021-08-27 (×2): 1 via ORAL
  Filled 2021-08-26 (×2): qty 1

## 2021-08-26 MED ORDER — METOPROLOL TARTRATE 25 MG PO TABS
25.0000 mg | ORAL_TABLET | Freq: Two times a day (BID) | ORAL | Status: DC
  Administered 2021-08-26 – 2021-08-27 (×3): 25 mg via ORAL
  Filled 2021-08-26 (×3): qty 1

## 2021-08-26 MED ORDER — TORSEMIDE 20 MG PO TABS
40.0000 mg | ORAL_TABLET | Freq: Every day | ORAL | Status: DC
Start: 2021-08-27 — End: 2021-08-27

## 2021-08-26 NOTE — Progress Notes (Signed)
Mobility Specialist Progress Note:   08/26/21 1100  Mobility  Activity Ambulated in hall  Level of Assistance Minimal assist, patient does 75% or more  Assistive Device Four wheel walker  Distance Ambulated (ft) 60 ft  Mobility Ambulated with assistance in hallway  Mobility Response Tolerated well  Mobility performed by Mobility specialist  Bed Position Chair  $Mobility charge 1 Mobility   Pt received EOB willing to participate in mobility. No complaints of pain. Pt returned to chair with call bell in reach and all needs met.   [facility] Public librarian Phone [phone] Secondary Phone [phone]

## 2021-08-26 NOTE — Consult Note (Signed)
Cardiology Consultation:   Patient ID: [name] MRN: [id number]; DOB: 15-Apr-1936  Admit date: 08/24/2021 Date of Consult: 08/26/2021  PCP:  [name], MD   [facility] Providers Cardiologist:  Berniece Salines, DO        Patient Profile:   [name] is a 85 y.o. male with a hx of severe CAD, PAF, HTN, HLD, ascending aortic aneurysm, chronic home 02 for chronic respiratory failure (COPD and asbestosis of the lung)who is being seen 08/26/2021 for the evaluation of acute CHF, PAF and angina at the request of Dr. Doristine Bosworth.  History of Present Illness:   Mr. [name] with above hx and a fib with rate and rhythm control, anticoagulation.  He was placed on Tikosyn at one point but had prolonged QTc with medication so stopped.  He had cardiac cath 11/16/19 and found to have pLCX 95% stenosis, pRCA 90% stenosis,  mRCA 75% stenosis, mod pulmonary HTN with mean PAP 39 mm Hg, Chronic respiratory failure with pCO2 of 67 and bicarb of 40 so pt treated medically with plan for possible PCI if angina. He wore a monitor post hospital with multiple atrial runs.  Has been stable.   08/24/21 pt presented to ER by EMS for chest pain and a fib with RVR - he is on eliquis.  HR was 130 to 160 - EMS gave 20 IV Cardizem and ASA 324.  He had 1 day of progressive SOB and increased edema.  He was aware of palpitations, along with substernal non radiation chest pressure. No diaphoresis, N,V, or dizziness.  He rec'd 40 mg IV lasix in ER on the 24th.  He was placed on IV lasix 40 mg BID.  ( At home lower ext edema controlled with torsemide 20 daily and prn metolazone.)   EKG:  The EKG was personally reviewed and demonstrates:  08/24/21 regular tachycardia a flutter at 132 no acute ST changes. Telemetry:  Telemetry was personally reviewed and demonstrates:  a fib/flutter to SR   Na 138, K+ 3.6 BUN 26 Cr 0.95 anion gap 17  Hs troponin 19, 63, 91, 40  BNP 778   WBC 11.2 Hgb 10.9 plts 220 New echo with EF 45-50%,  mild LVH, RV size is moderately enlarged. Mod elevated PA systolic pressure   both rt and lt atrial size mod. Dilated.  Mild to mod MR and mod MS, mild to mod AS and mild AI  (echo 03/2019 with EF 60-65% and normal valves)  BP 129/85 R 17 T 98.2 and P 107 to 120  Feeling comfortable in chair.  At home when he walks to BR with 02 in place at 4 L his HR increases and his sats drop to lower 80s and upper 70s.  No chest pain until this admit.    His IV dilt has been stopped and not on any rate slowing meds.    Past Medical History:  Diagnosis Date   AAA (abdominal aortic aneurysm)    Acute on chronic combined systolic and diastolic CHF (congestive heart failure) (HCC)    Allergic rhinitis    Aortic aneurysm without rupture ([place]) 04/26/2016   Atrial fibrillation with RVR (HCC)    Benign prostatic hyperplasia 04/26/2016   CAD (coronary artery disease) 12/02/2019   Cardiomyopathy ([place]) 12/02/2019   CHF (congestive heart failure) ([place]) 12/02/2019   Chronic allergic rhinitis 04/26/2016   Chronic respiratory failure ([place]) 06/13/2016   Chronic respiratory failure with hypoxia (HCC)    Chronic systolic heart failure ([place])  10/11/2020   Colon polyp    Congestive heart failure due to cardiomyopathy ([place]) 04/19/2021   Dyspnea 09/12/2016   Emphysema of lung ([place]) 04/26/2016   Essential hypertension 04/26/2016   Gastroesophageal reflux disease 06/13/2016   Gout 04/19/2021   H/O asbestos exposure 04/26/2016   Hardening of the aorta (main artery of the heart) ([place]) 10/11/2020   Heart murmur 04/26/2016   Hypercholesterolemia 10/11/2020   Hypertension    ILD (interstitial lung disease) ([place]) 04/26/2016   Long term (current) use of anticoagulants 04/19/2021   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   Thrombophilia ([place]) 10/11/2020   Typical atrial flutter ([place]) 10/11/2020    Past Surgical History:  Procedure Laterality Date   CATARACT EXTRACTION, BILATERAL     COLONOSCOPY     HERNIA REPAIR     as child   REPLACEMENT TOTAL KNEE Left    RIGHT/LEFT HEART CATH AND CORONARY ANGIOGRAPHY N/A 11/16/2019   Procedure: RIGHT/LEFT HEART CATH AND CORONARY ANGIOGRAPHY;  Surgeon: Martinique, Peter M, MD;  Location: [facility];  Service: Cardiovascular;  Laterality: N/A;   TONSILLECTOMY       Home Medications:  Prior to Admission medications   Medication Sig Start Date End Date Taking? Authorizing Provider  acetaminophen (TYLENOL) 500 MG tablet Take 1,000 mg by mouth every 6 (six) hours as needed for moderate pain or headache.   Yes [provider]   apixaban (ELIQUIS) 5 MG TABS tablet Take 1 tablet (5 mg total) by mouth 2 (two) times daily. 03/29/20  Yes Revankar, Reita Cliche, MD  atorvastatin (LIPITOR) 40 MG tablet Take 1 tablet (40 mg total) by mouth daily at 6 PM. 03/29/20  Yes Revankar, Reita Cliche, MD  Carboxymethylcellulose Sodium (REFRESH LIQUIGEL OP) Place 2 drops into the right eye daily as needed (irritated eyes).   Yes [provider]  famotidine (PEPCID) 20 MG tablet Take 20 mg by mouth daily as needed for heartburn or indigestion. 01/25/20  Yes [provider]  ferrous sulfate 324 MG TBEC Take 324 mg by mouth every Monday, Wednesday, and Friday.   Yes [provider]  fluticasone (FLONASE) 50 MCG/ACT nasal spray Place 2 sprays into both nostrils daily as needed for allergies (for seasonal allergies). 02/13/16  Yes [provider]  Krill Oil 500 MG CAPS Take 500 mg by mouth daily with breakfast.   Yes [provider]  metolazone (ZAROXOLYN) 2.5 MG tablet Take 1 tablet (2.5 mg total) by mouth daily. Patient taking differently: Take 2.5 mg by mouth daily as needed (weight gain over 3 pounds in 1 day). 03/06/21  Yes Revankar, Reita Cliche, MD  Multiple Vitamins-Minerals (ICAPS) CAPS Take 1 capsule by mouth daily with breakfast.   Yes [provider]  potassium chloride SA (KLOR-CON) 20 MEQ tablet Take 20 mEq by mouth daily. 05/10/21  Yes [provider]  PRESCRIPTION MEDICATION Apply 1 application topically See admin instructions. Apply to both ankles 3 days in a row, then 1 day off continuously. Cream from Dermatologist   Yes [provider]  sodium chloride (OCEAN) 0.65 % SOLN nasal spray Place 1 spray into both nostrils daily as needed for congestion.   Yes [provider]  torsemide (DEMADEX) 20 MG tablet Take 20 mg by mouth daily. 03/31/21  Yes [provider]  UNABLE TO FIND Take 1 capsule by mouth daily. Med Name: Super Beta Prostate   Yes [provider]    Inpatient Medications: Scheduled Meds:  apixaban  5 mg Oral BID   atorvastatin  40 mg Oral q1800   furosemide  40 mg Intravenous BID   potassium chloride SA  20 mEq Oral Daily   Continuous Infusions:  PRN Meds: acetaminophen **OR** acetaminophen  Allergies:    Allergies  Allergen Reactions   Fosinopril Other (See Comments)    Other reaction(s): Cough   Lasix [Furosemide] Diarrhea and Other (See Comments)    Additionally, this made the patient become dehydrated (eventually)   Terbinafine Hcl Other (See Comments)    Social History:   Social History   Socioeconomic History   Marital status: Married    Spouse name: Not on file   Number of children: 2   Years of education: Not on file   Highest education level: Not on file  Occupational History   Occupation: retired  Tobacco Use   Smoking status: Former    Packs/day: 1.00    Years: 10.00    Pack years: 10.00    Types: Cigarettes, Pipe, Cigars    Start date: 10/02/1955    Quit date: 10/01/1965    Years since quitting: 55.9   Smokeless tobacco: Former    Types: Chew  Substance and Sexual Activity   Alcohol use: No   Drug use: No   Sexual activity: Not on file  Other Topics Concern   Not on file  Social History Narrative   Originally from [place]. Has always lived in [place]. Served in [employer] and has traveled aboard ship extensively. 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 Has asbestos exposure through his work for 3.5 years from 1957-1961. 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 No mold or bird exposure. Enjoys golfing.    Social Determinants of Health   Financial Resource Strain: Not on file  Food Insecurity: Not on file  Transportation Needs: Not on file  Physical Activity: Not on file  Stress: Not on file  Social Connections: Not on file  Intimate Partner Violence: Not on file    Family History:    Family History  Problem Relation Age  of Onset   Heart disease Mother    Leukemia Father    Lung disease Neg  Hx    Rheumatologic disease Neg Hx      ROS:  Please see the history of present illness.  General:no colds or fevers, no weight changes Skin:no rashes or ulcers HEENT:no blurred vision, no congestion CV:see HPI PUL:see HPI GI:no diarrhea constipation or melena, no indigestion, hx reflux GU:no hematuria, no dysuria MS:no joint pain, no claudication Neuro:no syncope, no lightheadedness Endo:no diabetes, no thyroid disease  All other ROS reviewed and negative.     Physical Exam/Data:   Vitals:   08/25/21 2002 08/26/21 0027 08/26/21 0441 08/26/21 0826  BP: 105/72 119/87 123/87 129/85  Pulse: 90 96 (!) 106 78  Resp: _0 Temp: 98.2 F (36.8 C) 98.1 F (36.7 C) 97.7 F (36.5 C) 98.2 F (36.8 C)  TempSrc: Oral Oral Oral Oral  SpO2: 92% 90% 90% 91%  Weight:  101.5 kg    Height:        Intake/Output Summary (Last 24 hours) at 08/26/2021 1111 Last data filed at 08/26/2021 1047 Gross per 24 hour  Intake 360 ml  Output 2425 ml  Net -2065 ml   Last 3 Weights 08/26/2021 08/25/2021 04/21/2021  Weight (lbs) 223 lb 11.2 oz 231 lb 14.8 oz 243 lb  Weight (kg) 101.47 kg 105.2 kg 110.224 kg     Body mass index is 27.96 kg/m.  General:  Well nourished, well developed, in no acute distress sitting up in chair with 02 HEENT: normal Neck: no JVD sitting up Vascular: No carotid bruits; Distal pulses 2+ bilaterally Cardiac:  irreg, irreg; no murmur, gallup or click  Lungs:  diminished to auscultation bilaterally, no wheezing, rhonchi or rales  Abd: soft, nontender, no hepatomegaly  Ext: 2+ edema  Musculoskeletal:  No deformities, BUE and BLE strength normal and equal Skin: warm and dry  Neuro:  CNs 2-12 intact, no focal abnormalities noted Psych:  Normal affect   Relevant CV Studies:   Echo  08/25/21 IMPRESSIONS     1. Inferior basal hypokinesis abnormal septal motion . Left ventricular   ejection fraction, by estimation, is 45 to 50%. The left ventricle has  mildly decreased function. The left ventricle has no regional wall motion  abnormalities. There is mild left  ventricular hypertrophy. Left ventricular diastolic parameters were  normal.   2. Right ventricular systolic function is moderately reduced. The right  ventricular size is moderately enlarged. There is moderately elevated  pulmonary artery systolic pressure.   3. Left atrial size was moderately dilated.   4. Right atrial size was moderately dilated.   5. The mitral valve is degenerative. Mild to moderate mitral valve  regurgitation. No evidence of mitral stenosis. Moderate mitral annular  calcification.   6. The aortic valve is calcified. Aortic valve regurgitation is mild.  Mild to moderate aortic valve stenosis.   7. The inferior vena cava is normal in size with greater than 50%  respiratory variability, suggesting right atrial pressure of 3 mmHg.   FINDINGS   Left Ventricle: Inferior basal hypokinesis abnormal septal motion. Left  ventricular ejection fraction, by estimation, is 45 to 50%. The left  ventricle has mildly decreased function. The left ventricle has no  regional wall motion abnormalities. The left  ventricular internal cavity size was normal in size. There is mild left  ventricular hypertrophy. Left ventricular diastolic parameters were  normal.   Right Ventricle: The right ventricular size is moderately enlarged. No  increase in right ventricular wall thickness. Right ventricular systolic  function is moderately reduced.  There is moderately elevated pulmonary  artery systolic pressure. The tricuspid   regurgitant velocity is 3.26 m/s, and with an assumed right atrial  pressure of 3 mmHg, the estimated right ventricular systolic pressure is  62.9 mmHg.   Left Atrium: Left atrial size was moderately dilated.   Right Atrium: Right atrial size was moderately dilated.   Pericardium:  There is no evidence of pericardial effusion.   Mitral Valve: The mitral valve is degenerative in appearance. There is  mild thickening of the mitral valve leaflet(s). There is mild  calcification of the mitral valve leaflet(s). Moderate mitral annular  calcification. Mild to moderate mitral valve  regurgitation. No evidence of mitral valve stenosis.   Tricuspid Valve: The tricuspid valve is normal in structure. Tricuspid  valve regurgitation is mild . No evidence of tricuspid stenosis.   Aortic Valve: The aortic valve is calcified. Aortic valve regurgitation is  mild. Aortic regurgitation PHT measures 439 msec. Mild to moderate aortic  stenosis is present. Aortic valve mean gradient measures 14.2 mmHg. Aortic  valve peak gradient measures  23.2 mmHg. Aortic valve area, by VTI measures 1.17 cm.   Pulmonic Valve: The pulmonic valve was normal in structure. Pulmonic valve  regurgitation is not visualized. No evidence of pulmonic stenosis.   Aorta: The aortic root is normal in size and structure.   Venous: The inferior vena cava is normal in size with greater than 50%  respiratory variability, suggesting right atrial pressure of 3 mmHg.   IAS/Shunts: No atrial level shunt detected by color flow Doppler.      LEFT VENTRICLE  PLAX 2D  LVIDd:         4.90 cm   Diastology  LVIDs:         4.30 cm   LV e' medial:    5.66 cm/s  LV PW:         1.10 cm   LV E/e' medial:  15.4  LV IVS:        1.20 cm   LV e' lateral:   9.57 cm/s  LVOT diam:     2.40 cm   LV E/e' lateral: 9.1  LV SV:         52  LV SV Index:   22  LVOT Area:     4.52 cm      RIGHT VENTRICLE  RV S prime:     9.46 cm/s  TAPSE (M-mode): 1.9 cm   LEFT ATRIUM             Index        RIGHT ATRIUM           Index  LA diam:        4.30 cm 1.84 cm/m   RA Area:     39.40 cm  LA Vol (A2C):   67.6 ml 28.93 ml/m  RA Volume:   151.00 ml 64.61 ml/m  LA Vol (A4C):   63.9 ml 27.34 ml/m  LA Biplane Vol: 72.3 ml 30.94 ml/m    AORTIC VALVE  AV Area (Vmax):    1.41 cm  AV Area (Vmean):   1.34 cm  AV Area (VTI):     1.17 cm  AV Vmax:           240.80 cm/s  AV Vmean:          175.400 cm/s  AV VTI:            0.446 m  AV Peak Grad:  23.2 mmHg  AV Mean Grad:      14.2 mmHg  LVOT Vmax:         75.30 cm/s  LVOT Vmean:        52.000 cm/s  LVOT VTI:          0.116 m  LVOT/AV VTI ratio: 0.26  AI PHT:            439 msec     Cardiac cath Rt and Lt 11/16/19 Prox Cx lesion is 95% stenosed. Prox RCA lesion is 90% stenosed. Mid RCA lesion is 75% stenosed. LV end diastolic pressure is mildly elevated. Hemodynamic findings consistent with moderate pulmonary hypertension.   1. Severe 2 vessel obstructive CAD. This involves the proximal LCx and a Co dominant RCA.  2. LV filling pressures are mildly elevated 17-18 mm Hg 3. Moderate pulmonary HTN with mean PAP 39 mm Hg 4. Chronic respiratory failure with pCO2 of 67 and bicarb of 40. 5. Cardiac index 2.21.    Plan: reviewed with Dr Irish Lack. Patient presented with predominant symptoms of SOB. No significant chest pain. 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 He has significant access issues for coronary intervention. It may be prudent at this time to maximize his CHF therapy and control his arrhythmia. If he fails to respond to these measures then PCI of the RCA and LCx could be considered. If so then I would consider access from a left radial approach. If femoral access is needed then would need to use a very long access sheath to deal with iliac tortuosity. Flowsheet Row Most Recent Value  Fick Cardiac Output 5.28 L/min  Fick Cardiac Output Index 2.21 (L/min)/BSA  RA A Wave 14 mmHg  RA V Wave 18 mmHg  RA Mean 12 mmHg  RV Systolic Pressure 59 mmHg  RV Diastolic Pressure 5 mmHg  RV EDP 7 mmHg  PA Systolic Pressure 59 mmHg  PA Diastolic Pressure 25 mmHg  PA Mean 39 mmHg  PW A Wave 22 mmHg  PW V Wave  19 mmHg  PW Mean 18 mmHg  AO Systolic Pressure 616 mmHg  AO Diastolic Pressure 84 mmHg  AO Mean 073 mmHg  LV Systolic Pressure 710 mmHg  LV Diastolic Pressure 11 mmHg  LV EDP 17 mmHg  AOp Systolic Pressure 626 mmHg  AOp Diastolic Pressure 75 mmHg  AOp Mean Pressure 948 mmHg  LVp Systolic Pressure 546 mmHg  LVp Diastolic Pressure 10 mmHg  LVp EDP Pressure 16 mmHg  QP/QS 1  TPVR Index 17.66 HRUI  TSVR Index 50.26 HRUI  PVR SVR Ratio 0.21  TPVR/TSVR Ratio 0.35  Diagnostic Dominance: Co-dominant       Event monitor 12/16/19 Max 200 bpm 10:08am, 03/06 Min 61 bpm 04:21am, 02/26 Avg 78 bpm 7.7% PACs <1% PVCs Multiple atrial runs, fastest at 200 bpm lasting 14 beats, longest at 123 BPM lasting 23 minutes Triggered event associated with sinus rhythm    Laboratory Data:  High Sensitivity Troponin:   Recent Labs  Lab 08/24/21 1840 08/24/21 2123 08/25/21 0631 08/26/21 0811  TROPONINIHS 19* 63* 91* 40*     Chemistry Recent Labs  Lab 08/24/21 1840 08/25/21 0328 08/26/21 0811  NA 137 140 138  K 3.8 3.3* 3.6  CL 96* 98 91*  CO2 31 33* 30  GLUCOSE 128* 81 103*  BUN 33* 32* 26*  CREATININE 1.22 1.14 0.95  CALCIUM 8.9 8.7* 8.6*  MG 1.8 1.7  --  GFRNONAA 59* >60 >60  ANIONGAP 10 9 17*    Recent Labs  Lab 08/24/21 1840 08/25/21 0328  [name] 7.6 6.8  ALBUMIN 3.1* 2.8*  AST 17 14*  [name] 9 11  [name] 60 50  [name] 0.7 0.8   Lipids No results for input(s): CHOL, TRIG, HDL, LABVLDL, LDLCALC, CHOLHDL in the last 168 hours.  Hematology Recent Labs  Lab 08/24/21 1840 08/25/21 0328  WBC 9.7 11.2*  RBC 3.96* 3.61*  HGB 12.0* 10.9*  HCT 39.9 35.5*  MCV 100.8* 98.3  MCH 30.3 30.2  MCHC 30.1 30.7  RDW 15.5 15.3  PLT 242 220   Thyroid  Recent Labs  Lab 08/24/21 1841  TSH 2.779    BNP Recent Labs  Lab 08/24/21 1842  BNP 778.6*    DDimer No results for input(s): DDIMER in the last 168 hours.   Radiology/Studies:  DG Chest Portable 1  View  Result Date: 08/24/2021 CLINICAL DATA:  Chest pressure.  Shortness of breath. EXAM: PORTABLE CHEST 1 VIEW COMPARISON:  Multiple chest x-rays since 2013. FINDINGS: No pneumothorax. Known pleural plaques. Diffuse increased interstitial opacities, mildly increased in the interval. Stable cardiomegaly. Prominent tortuous thoracic aorta is poorly evaluated due to the portable technique but similar to previous studies. No other changes. IMPRESSION: 1. Increased interstitial markings in the lungs suggests edema versus atypical infection. Recommend clinical correlation. 2. Prominent tortuous thoracic aorta is similar to previous studies but poorly evaluated given today's technique. 3. Known calcified pleural plaques. 4. Known emphysematous changes in the lungs. Electronically Signed   By: Dorise Bullion III M.D.   On: 08/24/2021 19:10   ECHOCARDIOGRAM COMPLETE  Result Date: 08/25/2021    ECHOCARDIOGRAM REPORT   Patient Name:   [name] Date of Exam: 08/25/2021 Medical Rec #:  [id number]     Height:       75.0 in Accession #:    [id number]    Weight:       231.9 lb Date of Birth:  Sep 25, 1936     BSA:          2.337 m Patient Age:    71 years      BP:           132/101 mmHg Patient Gender: M             HR:           81 bpm. Exam Location:  Inpatient Procedure: 2D Echo, Cardiac Doppler and Color Doppler Indications:    A fib  History:        Patient has prior history of Echocardiogram examinations, most                 recent 03/24/2019. Signs/Symptoms:Murmur; Risk                 Factors:Dyslipidemia and Hypertension.  Sonographer:    Melissa Morford RDCS (AE, PE) Referring Phys: [id number] [place]  1. Inferior basal hypokinesis abnormal septal motion . Left ventricular ejection fraction, by estimation, is 45 to 50%. The left ventricle has mildly decreased function. The left ventricle has no regional wall motion abnormalities. There is mild left ventricular hypertrophy. Left ventricular  diastolic parameters were normal.  2. Right ventricular systolic function is moderately reduced. The right ventricular size is moderately enlarged. There is moderately elevated pulmonary artery systolic pressure.  3. Left atrial size was moderately dilated.  4. Right atrial size was moderately dilated.  5. The mitral valve is  degenerative. Mild to moderate mitral valve regurgitation. No evidence of mitral stenosis. Moderate mitral annular calcification.  6. The aortic valve is calcified. Aortic valve regurgitation is mild. Mild to moderate aortic valve stenosis.  7. The inferior vena cava is normal in size with greater than 50% respiratory variability, suggesting right atrial pressure of 3 mmHg. FINDINGS  Left Ventricle: Inferior basal hypokinesis abnormal septal motion. Left ventricular ejection fraction, by estimation, is 45 to 50%. The left ventricle has mildly decreased function. The left ventricle has no regional wall motion abnormalities. The left ventricular internal cavity size was normal in size. There is mild left ventricular hypertrophy. Left ventricular diastolic parameters were normal. Right Ventricle: The right ventricular size is moderately enlarged. No increase in right ventricular wall thickness. Right ventricular systolic function is moderately reduced. There is moderately elevated pulmonary artery systolic pressure. The tricuspid  regurgitant velocity is 3.26 m/s, and with an assumed right atrial pressure of 3 mmHg, the estimated right ventricular systolic pressure is 72.5 mmHg. Left Atrium: Left atrial size was moderately dilated. Right Atrium: Right atrial size was moderately dilated. Pericardium: There is no evidence of pericardial effusion. Mitral Valve: The mitral valve is degenerative in appearance. There is mild thickening of the mitral valve leaflet(s). There is mild calcification of the mitral valve leaflet(s). Moderate mitral annular calcification. Mild to moderate mitral valve  regurgitation. No evidence of mitral valve stenosis. Tricuspid Valve: The tricuspid valve is normal in structure. Tricuspid valve regurgitation is mild . No evidence of tricuspid stenosis. Aortic Valve: The aortic valve is calcified. Aortic valve regurgitation is mild. Aortic regurgitation PHT measures 439 msec. Mild to moderate aortic stenosis is present. Aortic valve mean gradient measures 14.2 mmHg. Aortic valve peak gradient measures 23.2 mmHg. Aortic valve area, by VTI measures 1.17 cm. Pulmonic Valve: The pulmonic valve was normal in structure. Pulmonic valve regurgitation is not visualized. No evidence of pulmonic stenosis. Aorta: The aortic root is normal in size and structure. Venous: The inferior vena cava is normal in size with greater than 50% respiratory variability, suggesting right atrial pressure of 3 mmHg. IAS/Shunts: No atrial level shunt detected by color flow Doppler.  LEFT VENTRICLE PLAX 2D LVIDd:         4.90 cm   Diastology LVIDs:         4.30 cm   LV e' medial:    5.66 cm/s LV PW:         1.10 cm   LV E/e' medial:  15.4 LV IVS:        1.20 cm   LV e' lateral:   9.57 cm/s LVOT diam:     2.40 cm   LV E/e' lateral: 9.1 LV SV:         52 LV SV Index:   22 LVOT Area:     4.52 cm  RIGHT VENTRICLE RV S prime:     9.46 cm/s TAPSE (M-mode): 1.9 cm LEFT ATRIUM             Index        RIGHT ATRIUM           Index LA diam:        4.30 cm 1.84 cm/m   RA Area:     39.40 cm LA Vol (A2C):   67.6 ml 28.93 ml/m  RA Volume:   151.00 ml 64.61 ml/m LA Vol (A4C):   63.9 ml 27.34 ml/m LA Biplane Vol: 72.3 ml 30.94 ml/m  AORTIC VALVE AV  Area (Vmax):    1.41 cm AV Area (Vmean):   1.34 cm AV Area (VTI):     1.17 cm AV Vmax:           240.80 cm/s AV Vmean:          175.400 cm/s AV VTI:            0.446 m AV Peak Grad:      23.2 mmHg AV Mean Grad:      14.2 mmHg LVOT Vmax:         75.30 cm/s LVOT Vmean:        52.000 cm/s LVOT VTI:          0.116 m LVOT/AV VTI ratio: 0.26 AI PHT:            439 msec  AORTA  Ao Root diam: 3.60 cm MITRAL VALVE               TRICUSPID VALVE MV Area (PHT): 5.62 cm    TV Peak grad:   47.6 mmHg MV Decel Time: 135 msec    TV Vmax:        3.45 m/s MV E velocity: 87.30 cm/s  TR Peak grad:   42.5 mmHg MV A velocity: 95.40 cm/s  TR Vmax:        326.00 cm/s MV E/A ratio:  0.92                            SHUNTS                            Systemic VTI:  0.12 m                            Systemic Diam: 2.40 cm Jenkins Rouge MD Electronically signed by Jenkins Rouge MD Signature Date/Time: 08/25/2021/2:17:14 PM    Final      Assessment and Plan:   Acute combined systolic and diastolic CHF - new systolic with EF now 75-79%.  Has rec'd lasix 40 mg IV X 3 and is neg 2,805 wt if correct down from 105.2 Kg to 101.5 Kg.   K+ Keep 4.0 and Mg+ 1.8 to 2.0, Cr is stable.   PAF with hx of PAF rate controlled usually, in July was in [place] for visit.   On eliquis as outpt   has not missed any eliquis.  Did not do well on tikosyn due to prolonged Qtc, with lung issues difficult to use BB or amiodarone.  Now with decrease in EF  dilt is an issue. Though drop in EF could be the RVR.  In combination with his CAD.   CAD significant in 2 vessels treating medically with no angina until now and may be due to a fib and demand ischemia.  Hs troponin minimally elevated.  COPD and asbestosis of lung on home 02. Ascending aortic aneurysm, last CT in 08/2020 and 4.4 cm  stable dialted main pulmonary artery at 4.5 cm.  HLD on statin continue   Risk Assessment/Risk Scores:     HEAR Score (for undifferentiated chest pain):  HEAR Score: 6  New York Heart Association (NYHA) Functional Class NYHA Class III  CHA2DS2-VASc Score = 5   This indicates a 7.2% annual risk of stroke. The patient's score is based upon: CHF History: 1 HTN History: 1 Diabetes History: 0 Stroke  History: 0 Vascular Disease History: 1 Age Score: 2 Gender Score: 0        For questions or updates, please contact [place] Please  consult [email or web address] for contact info under    Signed, Cecilie Kicks, NP  08/26/2021 11:11 AM

## 2021-08-26 NOTE — Progress Notes (Signed)
PROGRESS NOTE    [name]  RUE:[id number] DOB: May 08, 1936 DOA: 08/24/2021 PCP: [name], MD   Brief Narrative:  [name] is a 85 y.o. male with medical history significant for chronic diastolic heart failure, PAF anticoagulated on Eliquis, chronic hypoxic respiratory failure on continuous 3 L nasal cannula, hyperlipidemia, ILD, who is admitted to [facility] on 08/24/2021 with acute on chronic diastolic heart failure after presenting from home to [facility] ED complaining of shortness of breath. echocardiogram in June 2020 notable for LVEF 60 to 65%,  Patient reports good compliance with home diuretic regimen which consists of torsemide 20 mg p.o. daily, without any recent dose modifications to this medication.  He also has a known history of coronary artery disease with left coronary angiography in February 2021 showing obstructive CAD that was not amenable to revascularization intervention, with cardiology subsequently recommending medical management.   Vital signs in the ED were notable for the following: Afebrile; presenting heart rates in the 130s associated with atrial fibrillation with RVR, which decreased into the 90s to low 100s following labetalol 5 mg IV x1 and initiation of IV diuresis, as further detailed below.  With this improvement in rate control patient reports corresponding complete resolution of his chest discomfort.  Chest x-ray showed increased interstitial markings suggestive of edema, with atypical infection felt to be less likely.  Additionally, chest x-ray showed no evidence of effusion or pneumothorax.   Assessment & Plan:   Active Problems:   Acute combined systolic and diastolic heart failure (HCC)   Atrial fibrillation with RVR (HCC)   Chronic respiratory failure with hypoxia (HCC)   Hypercholesterolemia   Atypical chest pain   Elevated troponin   Acute on chronic diastolic CHF (congestive heart failure) (HCC)   Pressure injury of skin  Acute on  chronic combined systolic and diastolic congestive heart failure/chronic hypoxic respiratory failure: He feels better.  Still on 4 L of oxygen which is his baseline.  Has had almost 2 L out since admission.  Updated echo shows reduced ejection fraction to 45 to 50%, dropped from 60% previously in 2020.  Consulted cardiology, they have transitioned patient to torsemide oral and increase the dose to 40 mg and they are considering adding Entresto.  Paroxysmal atrial fibrillation: Presented with RVR, rates well controlled after IV diuresis.  He has now been started on metoprolol 25 mg twice daily.  Continue Eliquis.  Prior history of CAD with chest pain: His chest pain resolved in the ED and has not recurred.  Troponins only slightly elevated.  Likely demand ischemia.  Echo as above.  Defer to cardiology for further management.  Hyperlipidemia: Continue atorvastatin.  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  DVT prophylaxis: SCDs Start: 08/24/21 2048   Code Status: DNR  Family Communication: Patient's wife present at bedside, plan of care discussed with her and the patient both and verbalized understanding.    Status is: Inpatient  Remains inpatient appropriate because: Will be discharged when cleared by cardiology.  Estimated body mass index is 27.96 kg/m as calculated from the following:   Height as of this encounter: _0  (1.905 m).   Weight as of this encounter: 101.5 kg.  Pressure Injury 08/14/21 Buttocks Medial Stage 1 -  Intact skin with non-blanchable redness of a localized area usually over a bony prominence. (Active)  08/14/21 2300  Location: Buttocks  Location Orientation: Medial  Staging: Stage 1 -  Intact skin with non-blanchable redness  of a localized area usually over a bony prominence.  Wound Description (Comments):   Present on Admission: Yes    Nutritional Assessment: Body mass index is 27.96 kg/m.Marland Kitchen Seen by dietician.  I  agree with the assessment and plan as outlined below: Nutrition Status:   Skin Assessment: I have examined the patient's skin and I agree with the wound assessment as performed by the wound care RN as outlined below: Pressure Injury 08/14/21 Buttocks Medial Stage 1 -  Intact skin with non-blanchable redness of a localized area usually over a bony prominence. (Active)  08/14/21 2300  Location: Buttocks  Location Orientation: Medial  Staging: Stage 1 -  Intact skin with non-blanchable redness of a localized area usually over a bony prominence.  Wound Description (Comments):   Present on Admission: Yes    Consultants:  None  Procedures:  None  Antimicrobials:  Anti-infectives (From admission, onward)    None          Subjective:  Patient seen and examined.  Wife at the bedside.  He states that he is feeling better.  His breathing is back to baseline.  No other complaint.  Objective: Vitals:   08/26/21 0027 08/26/21 0441 08/26/21 0826 08/26/21 1207  BP: 119/87 123/87 129/85 133/80  Pulse: 96 (!) 106 78 (!) 54  Resp: _0 Temp: 98.1 F (36.7 C) 97.7 F (36.5 C) 98.2 F (36.8 C) (!) 97.5 F (36.4 C)  TempSrc: Oral Oral Oral Oral  SpO2: 90% 90% 91% (!) 85%  Weight: 101.5 kg     Height:        Intake/Output Summary (Last 24 hours) at 08/26/2021 1323 Last data filed at 08/26/2021 1047 Gross per 24 hour  Intake 240 ml  Output 2425 ml  Net -2185 ml    Filed Weights   08/25/21 0100 08/26/21 0027  Weight: 105.2 kg 101.5 kg    Examination:  General exam: Appears calm and comfortable  Respiratory system: Coarse breath sounds with Rales bilaterally, chronic due to ILD. Respiratory effort normal. Cardiovascular system: S1 & S2 heard, RRR. No JVD, murmurs, rubs, gallops or clicks.  +1 pitting edema bilateral lower extremity. Gastrointestinal system: Abdomen is nondistended, soft and nontender. No organomegaly or masses felt. Normal bowel sounds  heard. Central nervous system: Alert and oriented. No focal neurological deficits. Extremities: Symmetric 5 x 5 power. Skin: No rashes, lesions or ulcers.  Psychiatry: Judgement and insight appear normal. Mood & affect appropriate.    Data Reviewed: I have personally reviewed following labs and imaging studies  CBC: Recent Labs  Lab 08/24/21 1840 08/25/21 0328  WBC 9.7 11.2*  NEUTROABS 7.0  --   HGB 12.0* 10.9*  HCT 39.9 35.5*  MCV 100.8* 98.3  PLT 242 034    Basic Metabolic Panel: Recent Labs  Lab 08/24/21 1840 08/25/21 0328 08/26/21 0811  NA 137 140 138  K 3.8 3.3* 3.6  CL 96* 98 91*  CO2 31 33* 30  GLUCOSE 128* 81 103*  BUN 33* 32* 26*  CREATININE 1.22 1.14 0.95  CALCIUM 8.9 8.7* 8.6*  MG 1.8 1.7  --     GFR: Estimated Creatinine Clearance: 76.1 mL/min (by C-G formula based on SCr of 0.95 mg/dL). Liver Function Tests: Recent Labs  Lab 08/24/21 1840 08/25/21 0328  AST 17 14*  [name] 9 11  [name] 60 50  [name] 0.7 0.8  [name] 7.6 6.8  ALBUMIN 3.1* 2.8*    No results for input(s): LIPASE, AMYLASE  in the last 168 hours. No results for input(s): AMMONIA in the last 168 hours. Coagulation Profile: No results for input(s): INR, [name], [name], [name], TROPONINI in the last 168 hours. BNP (last 3 results) No results for input(s): PROBNP in the last 8760 hours. HbA1C: No results for input(s): HGBA1C in the last 72 hours. CBG: No results for input(s): GLUCAP in the last 168 hours. Lipid Profile: No results for input(s): CHOL, HDL, LDLCALC, TRIG, CHOLHDL, LDLDIRECT in the last 72 hours. Thyroid Function Tests: Recent Labs    08/24/21 1841  TSH 2.779    Anemia Panel: No results for input(s): VITAMINB12, FOLATE, FERRITIN, [name], IRON, RETICCTPCT in the last 72 hours. Sepsis Labs: Recent Labs  Lab 08/25/21 0328  PROCALCITON <0.10     Recent Results (from the past 240  hour(s))  Resp Panel by RT-PCR (Flu A&B, Covid) Nasopharyngeal Swab     Status: None   Collection Time: 08/24/21  8:45 PM   Specimen: Nasopharyngeal Swab; Nasopharyngeal(NP) swabs in vial transport medium  Result Value Ref Range Status   SARS Coronavirus 2 by RT PCR NEGATIVE NEGATIVE Final    Comment: (NOTE) SARS-CoV-2 target nucleic acids are NOT DETECTED.  The SARS-CoV-2 RNA is generally detectable in upper respiratory specimens during the acute phase of infection. The lowest concentration of SARS-CoV-2 viral copies this assay can detect is 138 copies/mL. A negative result does not preclude SARS-Cov-2 infection and should not be used as the sole basis for treatment or other patient management decisions. A negative result may occur with  improper specimen collection/handling, submission of specimen other than nasopharyngeal swab, presence of viral mutation(s) within the areas targeted by this assay, and inadequate number of viral copies(<138 copies/mL). A negative result must be combined with clinical observations, patient history, and epidemiological information. The expected result is Negative.  Fact Sheet for Patients:  EntrepreneurPulse.com.au  Fact Sheet for Healthcare Providers:  IncredibleEmployment.be  This test is no t yet approved or cleared by the [place] FDA and  has been authorized for detection and/or diagnosis of SARS-CoV-2 by FDA under an Emergency Use Authorization (EUA). This EUA will remain  in effect (meaning this test can be used) for the duration of the COVID-19 declaration under Section 564(b)(1) of the Act, 21 U.S.C.section 360bbb-3(b)(1), unless the authorization is terminated  or revoked sooner.       Influenza A by PCR NEGATIVE NEGATIVE Final   Influenza B by PCR NEGATIVE NEGATIVE Final    Comment: (NOTE) The Xpert Xpress SARS-CoV-2/FLU/RSV plus assay is intended as an aid in the diagnosis of influenza from  Nasopharyngeal swab specimens and should not be used as a sole basis for treatment. Nasal washings and aspirates are unacceptable for Xpert Xpress SARS-CoV-2/FLU/RSV testing.  Fact Sheet for Patients: EntrepreneurPulse.com.au  Fact Sheet for Healthcare Providers: IncredibleEmployment.be  This test is not yet approved or cleared by the [place] FDA and has been authorized for detection and/or diagnosis of SARS-CoV-2 by FDA under an Emergency Use Authorization (EUA). This EUA will remain in effect (meaning this test can be used) for the duration of the COVID-19 declaration under Section 564(b)(1) of the Act, 21 U.S.C. section 360bbb-3(b)(1), unless the authorization is terminated or revoked.  Performed at [facility], [place] [address], [place], [place] [address]        Radiology Studies: DG Chest Portable 1 View  Result Date: 08/24/2021 CLINICAL DATA:  Chest pressure.  Shortness of breath. EXAM: PORTABLE CHEST 1 VIEW COMPARISON:  Multiple chest x-rays since 2013. FINDINGS: No pneumothorax. Known pleural plaques. Diffuse increased interstitial opacities, mildly increased in the interval. Stable cardiomegaly. Prominent tortuous thoracic aorta is poorly evaluated due to the portable technique but similar to previous studies. No other changes. IMPRESSION: 1. Increased interstitial markings in the lungs suggests edema versus atypical infection. Recommend clinical correlation. 2. Prominent tortuous thoracic aorta is similar to previous studies but poorly evaluated given today's technique. 3. Known calcified pleural plaques. 4. Known emphysematous changes in the lungs. Electronically Signed   By: Dorise Bullion III M.D.   On: 08/24/2021 19:10   ECHOCARDIOGRAM COMPLETE  Result Date: 08/25/2021    ECHOCARDIOGRAM REPORT   Patient Name:   [name] Date of Exam: 08/25/2021 Medical Rec #:  [id number]     Height:       75.0 in Accession #:     [id number]    Weight:       231.9 lb Date of Birth:  07/22/1936     BSA:          2.337 m Patient Age:    82 years      BP:           132/101 mmHg Patient Gender: M             HR:           81 bpm. Exam Location:  Inpatient Procedure: 2D Echo, Cardiac Doppler and Color Doppler Indications:    A fib  History:        Patient has prior history of Echocardiogram examinations, most                 recent 03/24/2019. Signs/Symptoms:Murmur; Risk                 Factors:Dyslipidemia and Hypertension.  Sonographer:    Melissa Morford RDCS (AE, PE) Referring Phys: [id number] [place]  1. Inferior basal hypokinesis abnormal septal motion . Left ventricular ejection fraction, by estimation, is 45 to 50%. The left ventricle has mildly decreased function. The left ventricle has no regional wall motion abnormalities. There is mild left ventricular hypertrophy. Left ventricular diastolic parameters were normal.  2. Right ventricular systolic function is moderately reduced. The right ventricular size is moderately enlarged. There is moderately elevated pulmonary artery systolic pressure.  3. Left atrial size was moderately dilated.  4. Right atrial size was moderately dilated.  5. The mitral valve is degenerative. Mild to moderate mitral valve regurgitation. No evidence of mitral stenosis. Moderate mitral annular calcification.  6. The aortic valve is calcified. Aortic valve regurgitation is mild. Mild to moderate aortic valve stenosis.  7. The inferior vena cava is normal in size with greater than 50% respiratory variability, suggesting right atrial pressure of 3 mmHg. FINDINGS  Left Ventricle: Inferior basal hypokinesis abnormal septal motion. Left ventricular ejection fraction, by estimation, is 45 to 50%. The left ventricle has mildly decreased function. The left ventricle has no regional wall motion abnormalities. The left ventricular internal cavity size was normal in size. There is mild left ventricular  hypertrophy. Left ventricular diastolic parameters were normal. Right Ventricle: The right ventricular size is moderately enlarged. No increase in right ventricular wall thickness. Right ventricular systolic function is moderately reduced. There is moderately elevated pulmonary artery systolic pressure. The tricuspid  regurgitant velocity is 3.26 m/s, and with an assumed right atrial pressure of 3 mmHg, the estimated right  ventricular systolic pressure is 69.6 mmHg. Left Atrium: Left atrial size was moderately dilated. Right Atrium: Right atrial size was moderately dilated. Pericardium: There is no evidence of pericardial effusion. Mitral Valve: The mitral valve is degenerative in appearance. There is mild thickening of the mitral valve leaflet(s). There is mild calcification of the mitral valve leaflet(s). Moderate mitral annular calcification. Mild to moderate mitral valve regurgitation. No evidence of mitral valve stenosis. Tricuspid Valve: The tricuspid valve is normal in structure. Tricuspid valve regurgitation is mild . No evidence of tricuspid stenosis. Aortic Valve: The aortic valve is calcified. Aortic valve regurgitation is mild. Aortic regurgitation PHT measures 439 msec. Mild to moderate aortic stenosis is present. Aortic valve mean gradient measures 14.2 mmHg. Aortic valve peak gradient measures 23.2 mmHg. Aortic valve area, by VTI measures 1.17 cm. Pulmonic Valve: The pulmonic valve was normal in structure. Pulmonic valve regurgitation is not visualized. No evidence of pulmonic stenosis. Aorta: The aortic root is normal in size and structure. Venous: The inferior vena cava is normal in size with greater than 50% respiratory variability, suggesting right atrial pressure of 3 mmHg. IAS/Shunts: No atrial level shunt detected by color flow Doppler.  LEFT VENTRICLE PLAX 2D LVIDd:         4.90 cm   Diastology LVIDs:         4.30 cm   LV e' medial:    5.66 cm/s LV PW:         1.10 cm   LV E/e' medial:  15.4  LV IVS:        1.20 cm   LV e' lateral:   9.57 cm/s LVOT diam:     2.40 cm   LV E/e' lateral: 9.1 LV SV:         52 LV SV Index:   22 LVOT Area:     4.52 cm  RIGHT VENTRICLE RV S prime:     9.46 cm/s TAPSE (M-mode): 1.9 cm LEFT ATRIUM             Index        RIGHT ATRIUM           Index LA diam:        4.30 cm 1.84 cm/m   RA Area:     39.40 cm LA Vol (A2C):   67.6 ml 28.93 ml/m  RA Volume:   151.00 ml 64.61 ml/m LA Vol (A4C):   63.9 ml 27.34 ml/m LA Biplane Vol: 72.3 ml 30.94 ml/m  AORTIC VALVE AV Area (Vmax):    1.41 cm AV Area (Vmean):   1.34 cm AV Area (VTI):     1.17 cm AV Vmax:           240.80 cm/s AV Vmean:          175.400 cm/s AV VTI:            0.446 m AV Peak Grad:      23.2 mmHg AV Mean Grad:      14.2 mmHg LVOT Vmax:         75.30 cm/s LVOT Vmean:        52.000 cm/s LVOT VTI:          0.116 m LVOT/AV VTI ratio: 0.26 AI PHT:            439 msec  AORTA Ao Root diam: 3.60 cm MITRAL VALVE               TRICUSPID VALVE MV Area (  PHT): 5.62 cm    TV Peak grad:   47.6 mmHg MV Decel Time: 135 msec    TV Vmax:        3.45 m/s MV E velocity: 87.30 cm/s  TR Peak grad:   42.5 mmHg MV A velocity: 95.40 cm/s  TR Vmax:        326.00 cm/s MV E/A ratio:  0.92                            SHUNTS                            Systemic VTI:  0.12 m                            Systemic Diam: 2.40 cm Jenkins Rouge MD Electronically signed by Jenkins Rouge MD Signature Date/Time: 08/25/2021/2:17:14 PM    Final     Scheduled Meds:  apixaban  5 mg Oral BID   atorvastatin  40 mg Oral q1800   metoprolol tartrate  25 mg Oral BID   potassium chloride SA  20 mEq Oral Daily   [START ON 08/27/2021] torsemide  40 mg Oral Daily   Continuous Infusions:   LOS: 2 days   Time spent: 35 minutes   Darliss Cheney, MD [facility]  08/26/2021, 1:23 PM  Please page via Amion and do not message via secure chat for anything urgent. Secure chat can be used for anything non urgent.  How to contact the [facility] Attending or  Consulting provider [place] or covering provider during after hours [place], for this patient?  Check the care team in [facility] and look for a) attending/consulting [facility] provider listed and b) the [facility] team listed. Page or secure chat [phone]. Log into [email or web address] and use 's universal password to access. If you do not have the password, please contact the hospital operator. Locate the [facility] provider you are looking for under [facility] and page to a number that you can be directly reached. If you still have difficulty reaching the provider, please page the [facility] (Director on Call) for the Hospitalists listed on amion for assistance.

## 2021-08-27 DIAGNOSIS — I471 Supraventricular tachycardia: Secondary | ICD-10-CM

## 2021-08-27 DIAGNOSIS — I4719 Other supraventricular tachycardia: Secondary | ICD-10-CM

## 2021-08-27 LAB — GLUCOSE, CAPILLARY: Glucose-Capillary: 129 mg/dL — ABNORMAL HIGH (ref 70–99)

## 2021-08-27 LAB — BASIC METABOLIC PANEL
Anion gap: 9 (ref 5–15)
BUN: 24 mg/dL — ABNORMAL HIGH (ref 8–23)
CO2: 32 mmol/L (ref 22–32)
Calcium: 8.5 mg/dL — ABNORMAL LOW (ref 8.9–10.3)
Chloride: 95 mmol/L — ABNORMAL LOW (ref 98–111)
Creatinine, Ser: 0.88 mg/dL (ref 0.61–1.24)
GFR, Estimated: >60 mL/min (ref >60)
Glucose, Bld: 90 mg/dL (ref 70–99)
Potassium: 3.5 mmol/L (ref 3.5–5.1)
Sodium: 136 mmol/L (ref 135–145)

## 2021-08-27 MED ORDER — METOPROLOL TARTRATE 50 MG PO TABS: 50.0000 mg | ORAL_TABLET | Freq: Two times a day (BID) | ORAL | Status: DC

## 2021-08-27 MED ORDER — POLYETHYLENE GLYCOL 3350 17 G PO PACK
17.0000 g | PACK | Freq: Every day | ORAL | Status: DC | PRN
  Administered 2021-08-28: 08:00:00 17 g via ORAL
  Filled 2021-08-27: qty 1

## 2021-08-27 MED ORDER — TORSEMIDE 20 MG PO TABS
40.0000 mg | ORAL_TABLET | Freq: Every day | ORAL | Status: DC
  Administered 2021-08-28: 09:00:00 40 mg via ORAL
  Filled 2021-08-27: qty 2

## 2021-08-27 MED ORDER — METOPROLOL SUCCINATE ER 100 MG PO TB24
100.0000 mg | ORAL_TABLET | Freq: Every day | ORAL | Status: DC
  Administered 2021-08-28: 08:00:00 100 mg via ORAL
  Filled 2021-08-27: qty 1

## 2021-08-27 MED ORDER — METOPROLOL TARTRATE 50 MG PO TABS
50.0000 mg | ORAL_TABLET | Freq: Two times a day (BID) | ORAL | Status: AC
  Administered 2021-08-27: 22:00:00 50 mg via ORAL
  Filled 2021-08-27: qty 1

## 2021-08-27 MED ORDER — TORSEMIDE 20 MG PO TABS
20.0000 mg | ORAL_TABLET | Freq: Every day | ORAL | Status: DC
  Administered 2021-08-27: 11:00:00 20 mg via ORAL
  Filled 2021-08-27: qty 1

## 2021-08-27 NOTE — Progress Notes (Signed)
Progress Note  Patient Name: [name] Date of Encounter: 08/27/2021  [place] Cardiologist: Berniece Salines, DO   Subjective   Feeling better.  Walking limited by knee pain.  Reports previous intolerance to Entresto due to hypotension.  Inpatient Medications    Scheduled Meds:  apixaban  5 mg Oral BID   atorvastatin  40 mg Oral q1800   metoprolol tartrate  25 mg Oral BID   potassium chloride SA  20 mEq Oral Daily   sacubitril-valsartan  1 tablet Oral BID   torsemide  40 mg Oral Daily   Continuous Infusions:  PRN Meds: acetaminophen **OR** acetaminophen   Vital Signs    Vitals:   08/26/21 2200 08/27/21 0000 08/27/21 0400 08/27/21 0721  BP:  94/72 102/71 107/74  Pulse:  86 87 92  Resp:  18 17   Temp:  98.2 F (36.8 C) 98.2 F (36.8 C) 98.4 F (36.9 C)  TempSrc:  Oral Oral Oral  SpO2: 94% 91% 96% 90%  Weight:   101.4 kg   Height:        Intake/Output Summary (Last 24 hours) at 08/27/2021 1015 Last data filed at 08/27/2021 0900 Gross per 24 hour  Intake 600 ml  Output 925 ml  Net -325 ml   Last 3 Weights 08/27/2021 08/26/2021 08/25/2021  Weight (lbs) 223 lb 9.5 oz 223 lb 11.2 oz 231 lb 14.8 oz  Weight (kg) 101.42 kg 101.47 kg 105.2 kg      Telemetry    MAT.  Wandering atrial pacemaker.  Rate 80s to 120s.  Heart rate increases to the 120s with talking and comes back to the 90s with rest.- Personally Reviewed  ECG    N/AA- Personally Reviewed  Physical Exam   VS:  BP 115/76   Pulse 92   Temp 98.4 F (36.9 C) (Oral)   Resp 17   Ht _0  (1.905 m)   Wt 101.4 kg   SpO2 92%   BMI 27.95 kg/m  , BMI Body mass index is 27.95 kg/m. GENERAL: Chronically ill-appearing HEENT: Pupils equal round and reactive, fundi not visualized, oral mucosa unremarkable NECK:  No jugular venous distention, waveform within normal limits, carotid upstroke brisk and symmetric, no bruits, no thyromegaly LUNGS: Dry crackles HEART: Irregularly irregular.Marland Kitchen  PMI not  displaced or sustained,S1 and S2 within normal limits, no S3, no S4, no clicks, no rubs, no murmurs ABD:  Flat, positive bowel sounds normal in frequency in pitch, no bruits, no rebound, no guarding, no midline pulsatile mass, no hepatomegaly, no splenomegaly EXT:  2 plus pulses throughout, 1+ LE edema, no cyanosis no clubbing SKIN: Anterior tibia erythma NEURO:  Cranial nerves II through XII grossly intact, motor grossly intact throughout PSYCH:  Cognitively intact, oriented to person place and time   Labs    High Sensitivity Troponin:   Recent Labs  Lab 08/24/21 1840 08/24/21 2123 08/25/21 0631 08/26/21 0811  TROPONINIHS 19* 63* 91* 40*     Chemistry Recent Labs  Lab 08/24/21 1840 08/25/21 0328 08/26/21 0811 08/27/21 0248  NA 137 140 138 136  K 3.8 3.3* 3.6 3.5  CL 96* 98 91* 95*  CO2 31 33* 30 32  GLUCOSE 128* 81 103* 90  BUN 33* 32* 26* 24*  CREATININE 1.22 1.14 0.95 0.88  CALCIUM 8.9 8.7* 8.6* 8.5*  MG 1.8 1.7  --   --   [name] 7.6 6.8  --   --   ALBUMIN 3.1* 2.8*  --   --  AST 17 14*  --   --   [name] 9 11  --   --   [name] 60 50  --   --   [name] 0.7 0.8  --   --   GFRNONAA 59* >60 >60 >60  ANIONGAP 10 9 17* 9    Lipids No results for input(s): CHOL, TRIG, HDL, LABVLDL, LDLCALC, CHOLHDL in the last 168 hours.  Hematology Recent Labs  Lab 08/24/21 1840 08/25/21 0328  WBC 9.7 11.2*  RBC 3.96* 3.61*  HGB 12.0* 10.9*  HCT 39.9 35.5*  MCV 100.8* 98.3  MCH 30.3 30.2  MCHC 30.1 30.7  RDW 15.5 15.3  PLT 242 220   Thyroid  Recent Labs  Lab 08/24/21 1841  TSH 2.779    BNP Recent Labs  Lab 08/24/21 1842  BNP 778.6*    DDimer No results for input(s): DDIMER in the last 168 hours.   Radiology    ECHOCARDIOGRAM COMPLETE  Result Date: 08/25/2021    ECHOCARDIOGRAM REPORT   Patient Name:   [name] Date of Exam: 08/25/2021 Medical Rec #:  [id number]     Height:       75.0 in Accession #:    [id number]    Weight:       231.9 lb Date of Birth:   [birth year]     BSA:          2.337 m Patient Age:    85 years      BP:           132/101 mmHg Patient Gender: M             HR:           81 bpm. Exam Location:  Inpatient Procedure: 2D Echo, Cardiac Doppler and Color Doppler Indications:    A fib  History:        Patient has prior history of Echocardiogram examinations, most                 recent 03/24/2019. Signs/Symptoms:Murmur; Risk                 Factors:Dyslipidemia and Hypertension.  Sonographer:    Melissa Morford RDCS (AE, PE) Referring Phys: [id number] [place]  1. Inferior basal hypokinesis abnormal septal motion . Left ventricular ejection fraction, by estimation, is 45 to 50%. The left ventricle has mildly decreased function. The left ventricle has no regional wall motion abnormalities. There is mild left ventricular hypertrophy. Left ventricular diastolic parameters were normal.  2. Right ventricular systolic function is moderately reduced. The right ventricular size is moderately enlarged. There is moderately elevated pulmonary artery systolic pressure.  3. Left atrial size was moderately dilated.  4. Right atrial size was moderately dilated.  5. The mitral valve is degenerative. Mild to moderate mitral valve regurgitation. No evidence of mitral stenosis. Moderate mitral annular calcification.  6. The aortic valve is calcified. Aortic valve regurgitation is mild. Mild to moderate aortic valve stenosis.  7. The inferior vena cava is normal in size with greater than 50% respiratory variability, suggesting right atrial pressure of 3 mmHg. FINDINGS  Left Ventricle: Inferior basal hypokinesis abnormal septal motion. Left ventricular ejection fraction, by estimation, is 45 to 50%. The left ventricle has mildly decreased function. The left ventricle has no regional wall motion abnormalities. The left ventricular internal cavity size was normal in size. There is mild left ventricular hypertrophy. Left ventricular diastolic parameters were  normal. Right Ventricle: The  right ventricular size is moderately enlarged. No increase in right ventricular wall thickness. Right ventricular systolic function is moderately reduced. There is moderately elevated pulmonary artery systolic pressure. The tricuspid  regurgitant velocity is 3.26 m/s, and with an assumed right atrial pressure of 3 mmHg, the estimated right ventricular systolic pressure is 40.3 mmHg. Left Atrium: Left atrial size was moderately dilated. Right Atrium: Right atrial size was moderately dilated. Pericardium: There is no evidence of pericardial effusion. Mitral Valve: The mitral valve is degenerative in appearance. There is mild thickening of the mitral valve leaflet(s). There is mild calcification of the mitral valve leaflet(s). Moderate mitral annular calcification. Mild to moderate mitral valve regurgitation. No evidence of mitral valve stenosis. Tricuspid Valve: The tricuspid valve is normal in structure. Tricuspid valve regurgitation is mild . No evidence of tricuspid stenosis. Aortic Valve: The aortic valve is calcified. Aortic valve regurgitation is mild. Aortic regurgitation PHT measures 439 msec. Mild to moderate aortic stenosis is present. Aortic valve mean gradient measures 14.2 mmHg. Aortic valve peak gradient measures 23.2 mmHg. Aortic valve area, by VTI measures 1.17 cm. Pulmonic Valve: The pulmonic valve was normal in structure. Pulmonic valve regurgitation is not visualized. No evidence of pulmonic stenosis. Aorta: The aortic root is normal in size and structure. Venous: The inferior vena cava is normal in size with greater than 50% respiratory variability, suggesting right atrial pressure of 3 mmHg. IAS/Shunts: No atrial level shunt detected by color flow Doppler.  LEFT VENTRICLE PLAX 2D LVIDd:         4.90 cm   Diastology LVIDs:         4.30 cm   LV e' medial:    5.66 cm/s LV PW:         1.10 cm   LV E/e' medial:  15.4 LV IVS:        1.20 cm   LV e' lateral:   9.57 cm/s LVOT  diam:     2.40 cm   LV E/e' lateral: 9.1 LV SV:         52 LV SV Index:   22 LVOT Area:     4.52 cm  RIGHT VENTRICLE RV S prime:     9.46 cm/s TAPSE (M-mode): 1.9 cm LEFT ATRIUM             Index        RIGHT ATRIUM           Index LA diam:        4.30 cm 1.84 cm/m   RA Area:     39.40 cm LA Vol (A2C):   67.6 ml 28.93 ml/m  RA Volume:   151.00 ml 64.61 ml/m LA Vol (A4C):   63.9 ml 27.34 ml/m LA Biplane Vol: 72.3 ml 30.94 ml/m  AORTIC VALVE AV Area (Vmax):    1.41 cm AV Area (Vmean):   1.34 cm AV Area (VTI):     1.17 cm AV Vmax:           240.80 cm/s AV Vmean:          175.400 cm/s AV VTI:            0.446 m AV Peak Grad:      23.2 mmHg AV Mean Grad:      14.2 mmHg LVOT Vmax:         75.30 cm/s LVOT Vmean:        52.000 cm/s LVOT VTI:  0.116 m LVOT/AV VTI ratio: 0.26 AI PHT:            439 msec  AORTA Ao Root diam: 3.60 cm MITRAL VALVE               TRICUSPID VALVE MV Area (PHT): 5.62 cm    TV Peak grad:   47.6 mmHg MV Decel Time: 135 msec    TV Vmax:        3.45 m/s MV E velocity: 87.30 cm/s  TR Peak grad:   42.5 mmHg MV A velocity: 95.40 cm/s  TR Vmax:        326.00 cm/s MV E/A ratio:  0.92                            SHUNTS                            Systemic VTI:  0.12 m                            Systemic Diam: 2.40 cm Jenkins Rouge MD Electronically signed by Jenkins Rouge MD Signature Date/Time: 08/25/2021/2:17:14 PM    Final     Cardiac Studies   Cardiac cath Rt and Lt 12/09/19 Prox Cx lesion is 95% stenosed. Prox RCA lesion is 90% stenosed. Mid RCA lesion is 75% stenosed. LV end diastolic pressure is mildly elevated. Hemodynamic findings consistent with moderate pulmonary hypertension.   1. Severe 2 vessel obstructive CAD. This involves the proximal LCx and a Co dominant RCA.  2. LV filling pressures are mildly elevated 17-18 mm Hg 3. Moderate pulmonary HTN with mean PAP 39 mm Hg 4. Chronic respiratory failure with pCO2 of 67 and bicarb of 40. 5. Cardiac index 2.21.   Echo  08/25/21: 1. Inferior basal hypokinesis abnormal septal motion . Left ventricular  ejection fraction, by estimation, is 45 to 50%. The left ventricle has  mildly decreased function. The left ventricle has no regional wall motion  abnormalities. There is mild left  ventricular hypertrophy. Left ventricular diastolic parameters were  normal.   2. Right ventricular systolic function is moderately reduced. The right  ventricular size is moderately enlarged. There is moderately elevated  pulmonary artery systolic pressure.   3. Left atrial size was moderately dilated.   4. Right atrial size was moderately dilated.   5. The mitral valve is degenerative. Mild to moderate mitral valve  regurgitation. No evidence of mitral stenosis. Moderate mitral annular  calcification.   6. The aortic valve is calcified. Aortic valve regurgitation is mild.  Mild to moderate aortic valve stenosis.   7. The inferior vena cava is normal in size with greater than 50%  respiratory variability, suggesting right atrial pressure of 3 mmHg.   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 fib with RVR, acute systolic heart failure and chest pain.    Assessment & Plan    #Acute systolic diastolic heart failure: LVEF this admission 45 to 50%.  It was previously normal.  He is currently feeling much better and feels that his breathing is near baseline.  He is net -2.8 L.  He feels that he is getting dry and that he is at his baseline.  We attempted to add Entresto, but he became hypotensive.  He had this issue as an outpatient, therefore we will discontinue it.  Increase metoprolol to 50 mg for better heart rate control.  Increase his home torsemide to 40 mg.  Would attempt to add a low-dose ARB as an outpatient if blood pressure and renal function permit.    #PAF: #MAT: He notes that he gets very short of breath with exertion and his heart starts racing.  While watching his heart monitor in the room I noted that he becomes tachycardic to the 120s when talking and this goes back down to the 80s or 90s at rest.  I think most of this is driven by his poor lung function.  He has been consistently anticoagulated.  He did not tolerate Tikosyn in the past due to QT prolongation.  Currently he is in MAT.  His recorded heart rates are bradycardic.  But I suspect these are checked with a Dinamap and inaccurate.  On telemetry he is consistently tachycardic in MAT.  Metoprolol was added at 25 mg twice daily.  Plan to switch to 100 mg of metoprolol succinate starting tomorrow.   #CAD, native: #Elevated troponin: #Hyperlipidemia: The decision was made not to intervene on his coronary disease unless he develops angina.  It is possible that his newly reduced LVEF is associated with the 95% proximal left circumflex, 90% proximal RCA, and 75% mid RCA disease.  He does have focal inferior basal hypokinesis.  I suspect that the abnormal septal motion is more related to his elevated pulmonary pressures.  Troponin mildly elevated to 40.  Repeat cardiac cath could be considered as an outpatient.  Adding metoprolol as above.  Continue atorvastatin.  He is not on aspirin given that he is on Eliquis.  [facility] will sign off.   Medication Recommendations: Switch metoprolol to 100 mg of succinate.  Stop Entresto due to hypotension Other recommendations (labs, testing, etc): Consider adding an ARB as an outpatient if blood pressure permits Follow up as an outpatient: We will arrange.  For questions or updates, please contact [facility] Please consult [email or web address] for contact info under        Signed, Skeet Latch, MD  08/27/2021, 10:15 AM

## 2021-08-27 NOTE — Progress Notes (Addendum)
NT was making rounds on the patient and patient stated that he felt clammy and flushed; RN was notified by NT. VS obtained and stable. CBG obtained and stable. RN spoke to unit pharmacist to consider which med that could cause the symptoms. Patient stated to RN "I believe I know what is wrong with me, I took too much tylenol". Patient stated that he took two pills of the tylenol given by his wife after RN gave him a prn tylenol (2x32m) dose. Patient states that "he feels better, less flushness and less tingly feeling". RN educated both the patient and patient's wife on the importance of only taking medications from the unit's staff to prevent any reactions that could arise from taking a home medication not cleared by our pharmacy. RN spoke to the patient's wife to return the home medication of tylenol back to their home. Patient and patient's wife verbalize understanding and offer their regrets. RN will continue to monitor the patient for any changes.   Paged Pahwani MD to notify.

## 2021-08-27 NOTE — Progress Notes (Signed)
Patient requested if he could have prn order of PO miralax for mild constipation. Paged Pahwani to notify.

## 2021-08-27 NOTE — Progress Notes (Signed)
Mobility Specialist Progress Note:   08/27/21 1042  Mobility  Activity Ambulated in room  Level of Assistance Moderate assist, patient does 50-74%  Assistive Device Four wheel walker  Distance Ambulated (ft) 30 ft  Mobility Ambulated with assistance in room  Mobility Response Tolerated fair  Mobility performed by Mobility specialist  $Mobility charge 1 Mobility   Pt received in bed willing to participate in mobility.Mod A to stand contact guard throughout ambulation. Knee pain limited ambulation. Returned to bed with call bell in reach and all needs met.  [facility] Public librarian Phone [phone] Secondary Phone [phone]

## 2021-08-27 NOTE — Progress Notes (Signed)
PROGRESS NOTE    [name]  WPY:[id number] DOB: [birth year] DOA: 08/24/2021 PCP: [name], MD   Brief Narrative:  [name] is a 85 y.o. male with medical history significant for chronic diastolic heart failure, PAF anticoagulated on Eliquis, chronic hypoxic respiratory failure on continuous 3 L nasal cannula, hyperlipidemia, ILD, who is admitted to [facility] on 08/24/2021 with acute on chronic diastolic heart failure after presenting from home to [facility] ED complaining of shortness of breath. echocardiogram in June 2020 notable for LVEF 60 to 65%,  Patient reports good compliance with home diuretic regimen which consists of torsemide 20 mg p.o. daily, without any recent dose modifications to this medication.  He also has a known history of coronary artery disease with left coronary angiography in February 2021 showing obstructive CAD that was not amenable to revascularization intervention, with cardiology subsequently recommending medical management.   Vital signs in the ED were notable for the following: Afebrile; presenting heart rates in the 130s associated with atrial fibrillation with RVR, which decreased into the 90s to low 100s following labetalol 5 mg IV x1 and initiation of IV diuresis, as further detailed below.  With this improvement in rate control patient reports corresponding complete resolution of his chest discomfort.  Chest x-ray showed increased interstitial markings suggestive of edema, with atypical infection felt to be less likely.  Additionally, chest x-ray showed no evidence of effusion or pneumothorax.   Assessment & Plan:   Active Problems:   Acute combined systolic and diastolic heart failure (HCC)   Atrial fibrillation with RVR (HCC)   Chronic respiratory failure with hypoxia (HCC)   Hypercholesterolemia   Atypical chest pain   Elevated troponin   Acute on chronic diastolic CHF (congestive heart failure) (HCC)   Pressure injury of skin  Acute on  chronic combined systolic and diastolic congestive heart failure/chronic hypoxic respiratory failure: He feels better.  Still on 4 L of oxygen which is his baseline.  Has had almost 3 L out since admission.  Updated echo shows reduced ejection fraction to 45 to 50%, dropped from 60% previously in 2020.  Consulted cardiology, they have transitioned patient to torsemide oral and increase the dose to 40 mg and recommended starting on Entresto which I did as well.  They are considering outpatient cardiac cath.  Paroxysmal atrial fibrillation/MAT: Presented with RVR, rates well controlled after IV diuresis.  Per cardiology note, patient appears to be having MAT on monitor.  This morning during my evaluation, patient's heart rate went to 140s with chest talking and at rest, they were hovering around 115.  He was started on metoprolol yesterday.  Blood pressure borderline low.  Wonder if his metoprolol will need to be increased but not sure if blood pressure will allow.  Will defer to cardiology.  Prior history of CAD with chest pain: His chest pain resolved in the ED and has not recurred.  Troponins only slightly elevated.  Likely demand ischemia.  Echo as above.  Plans for outpatient cardiac cath.  Defer to cardiology for further management.  Hyperlipidemia: Continue atorvastatin.  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  DVT prophylaxis: SCDs Start: 08/24/21 2048   Code Status: DNR  Family Communication: Patient's wife present at bedside, plan of care discussed with her and the patient both and verbalized understanding.    Status is: Inpatient  Remains inpatient appropriate because: Will be discharged when cleared by cardiology.  Estimated body mass index is 27.95  kg/m as calculated from the following:   Height as of this encounter: 6' 3" (1.905 m).   Weight as of this encounter: 101.4 kg.  Pressure Injury 08/14/21 Buttocks Medial Stage 1 -  Intact skin  with non-blanchable redness of a localized area usually over a bony prominence. (Active)  08/14/21 2300  Location: Buttocks  Location Orientation: Medial  Staging: Stage 1 -  Intact skin with non-blanchable redness of a localized area usually over a bony prominence.  Wound Description (Comments):   Present on Admission: Yes    Nutritional Assessment: Body mass index is 27.95 kg/m.Marland Kitchen Seen by dietician.  I agree with the assessment and plan as outlined below: Nutrition Status:   Skin Assessment: I have examined the patient's skin and I agree with the wound assessment as performed by the wound care RN as outlined below: Pressure Injury 08/14/21 Buttocks Medial Stage 1 -  Intact skin with non-blanchable redness of a localized area usually over a bony prominence. (Active)  08/14/21 2300  Location: Buttocks  Location Orientation: Medial  Staging: Stage 1 -  Intact skin with non-blanchable redness of a localized area usually over a bony prominence.  Wound Description (Comments):   Present on Admission: Yes    Consultants:  None  Procedures:  None  Antimicrobials:  Anti-infectives (From admission, onward)    None          Subjective:  Patient seen and examined.  Wife at the bedside.  Patient has no complaints.  Objective: Vitals:   08/26/21 2200 08/27/21 0000 08/27/21 0400 08/27/21 0721  BP:  94/72 102/71 107/74  Pulse:  86 87 92  Resp:  18 17   Temp:  98.2 F (36.8 C) 98.2 F (36.8 C) 98.4 F (36.9 C)  TempSrc:  Oral Oral Oral  SpO2: 94% 91% 96% 90%  Weight:   101.4 kg   Height:        Intake/Output Summary (Last 24 hours) at 08/27/2021 1028 Last data filed at 08/27/2021 0900 Gross per 24 hour  Intake 600 ml  Output 925 ml  Net -325 ml    Filed Weights   08/25/21 0100 08/26/21 0027 08/27/21 0400  Weight: 105.2 kg 101.5 kg 101.4 kg    Examination:  General exam: Appears calm and comfortable  Respiratory system: Rhonchi bilaterally, reduced compared  to yesterday. Respiratory effort normal. Cardiovascular system: S1 & S2 heard, irregularly irregular rate and rhythm. No JVD, murmurs, rubs, gallops or clicks. No pedal edema. Gastrointestinal system: Abdomen is nondistended, soft and nontender. No organomegaly or masses felt. Normal bowel sounds heard. Central nervous system: Alert and oriented. No focal neurological deficits. Extremities: Symmetric 5 x 5 power. Skin: No rashes, lesions or ulcers.  Psychiatry: Judgement and insight appear normal. Mood & affect appropriate.    Data Reviewed: I have personally reviewed following labs and imaging studies  CBC: Recent Labs  Lab 08/24/21 1840 08/25/21 0328  WBC 9.7 11.2*  NEUTROABS 7.0  --   HGB 12.0* 10.9*  HCT 39.9 35.5*  MCV 100.8* 98.3  PLT 242 469    Basic Metabolic Panel: Recent Labs  Lab 08/24/21 1840 08/25/21 0328 08/26/21 0811 08/27/21 0248  NA 137 140 138 136  K 3.8 3.3* 3.6 3.5  CL 96* 98 91* 95*  CO2 31 33* 30 32  GLUCOSE 128* 81 103* 90  BUN 33* 32* 26* 24*  CREATININE 1.22 1.14 0.95 0.88  CALCIUM 8.9 8.7* 8.6* 8.5*  MG 1.8 1.7  --   --  GFR: Estimated Creatinine Clearance: 82.1 mL/min (by C-G formula based on SCr of 0.88 mg/dL). Liver Function Tests: Recent Labs  Lab 08/24/21 1840 08/25/21 0328  AST 17 14*  [name] 9 11  [name] 60 50  [name] 0.7 0.8  [name] 7.6 6.8  ALBUMIN 3.1* 2.8*    No results for input(s): LIPASE, [name], [name], [name], [name], TROPONINI in the last 168 hours. BNP (last 3 results) No results for input(s): PROBNP in the last 8760 hours. HbA1C: No results for input(s): HGBA1C in the last 72 hours. CBG: No results for input(s): GLUCAP in the last 168 hours. Lipid Profile: No results for input(s): CHOL, HDL, LDLCALC,  TRIG, CHOLHDL, LDLDIRECT in the last 72 hours. Thyroid Function Tests: Recent Labs    08/24/21 1841  TSH 2.779    Anemia Panel: No results for input(s): VITAMINB12, FOLATE, FERRITIN, [name], IRON, RETICCTPCT in the last 72 hours. Sepsis Labs: Recent Labs  Lab 08/25/21 0328  PROCALCITON <0.10     Recent Results (from the past 240 hour(s))  Resp Panel by RT-PCR (Flu A&B, Covid) Nasopharyngeal Swab     Status: None   Collection Time: 08/24/21  8:45 PM   Specimen: Nasopharyngeal Swab; Nasopharyngeal(NP) swabs in vial transport medium  Result Value Ref Range Status   SARS Coronavirus 2 by RT PCR NEGATIVE NEGATIVE Final    Comment: (NOTE) SARS-CoV-2 target nucleic acids are NOT DETECTED.  The SARS-CoV-2 RNA is generally detectable in upper respiratory specimens during the acute phase of infection. The lowest concentration of SARS-CoV-2 viral copies this assay can detect is 138 copies/mL. A negative result does not preclude SARS-Cov-2 infection and should not be used as the sole basis for treatment or other patient management decisions. A negative result may occur with  improper specimen collection/handling, submission of specimen other than nasopharyngeal swab, presence of viral mutation(s) within the areas targeted by this assay, and inadequate number of viral copies(<138 copies/mL). A negative result must be combined with clinical observations, patient history, and epidemiological information. The expected result is Negative.  Fact Sheet for Patients:  EntrepreneurPulse.com.au  Fact Sheet for Healthcare Providers:  IncredibleEmployment.be  This test is no t yet approved or cleared by the [place] FDA and  has been authorized for detection and/or diagnosis of SARS-CoV-2 by FDA under an Emergency Use Authorization (EUA). This EUA will remain  in effect (meaning this test can be used) for the duration of the COVID-19 declaration under  Section 564(b)(1) of the Act, 21 U.S.C.section 360bbb-3(b)(1), unless the authorization is terminated  or revoked sooner.       Influenza A by PCR NEGATIVE NEGATIVE Final   Influenza B by PCR NEGATIVE NEGATIVE Final    Comment: (NOTE) The Xpert Xpress SARS-CoV-2/FLU/RSV plus assay is intended as an aid in the diagnosis of influenza from Nasopharyngeal swab specimens and should not be used as a sole basis for treatment. Nasal washings and aspirates are unacceptable for Xpert Xpress SARS-CoV-2/FLU/RSV testing.  Fact Sheet for Patients: EntrepreneurPulse.com.au  Fact Sheet for Healthcare Providers: IncredibleEmployment.be  This test is not yet approved or cleared by the [place] FDA and has been authorized for detection and/or diagnosis of SARS-CoV-2 by FDA under an Emergency Use Authorization (EUA). This EUA will remain in effect (meaning this test can be used) for the  duration of the COVID-19 declaration under Section 564(b)(1) of the Act, 21 U.S.C. section 360bbb-3(b)(1), unless the authorization is terminated or revoked.  Performed at [facility], [place] [address], [place],  [address]        Radiology Studies: ECHOCARDIOGRAM COMPLETE  Result Date: 08/25/2021    ECHOCARDIOGRAM REPORT   Patient Name:   XZAVIER SWINGER Date of Exam: 08/25/2021 Medical Rec #:  [id number]     Height:       75.0 in Accession #:    [id number]    Weight:       231.9 lb Date of Birth:  1936-09-25     BSA:          2.337 m Patient Age:    13 years      BP:           132/101 mmHg Patient Gender: M             HR:           81 bpm. Exam Location:  Inpatient Procedure: 2D Echo, Cardiac Doppler and Color Doppler Indications:    A fib  History:        Patient has prior history of Echocardiogram examinations, most                 recent 03/24/2019. Signs/Symptoms:Murmur; Risk                 Factors:Dyslipidemia and Hypertension.  Sonographer:    Melissa  Morford RDCS (AE, PE) Referring Phys: [id number] [place]  1. Inferior basal hypokinesis abnormal septal motion . Left ventricular ejection fraction, by estimation, is 45 to 50%. The left ventricle has mildly decreased function. The left ventricle has no regional wall motion abnormalities. There is mild left ventricular hypertrophy. Left ventricular diastolic parameters were normal.  2. Right ventricular systolic function is moderately reduced. The right ventricular size is moderately enlarged. There is moderately elevated pulmonary artery systolic pressure.  3. Left atrial size was moderately dilated.  4. Right atrial size was moderately dilated.  5. The mitral valve is degenerative. Mild to moderate mitral valve regurgitation. No evidence of mitral stenosis. Moderate mitral annular calcification.  6. The aortic valve is calcified. Aortic valve regurgitation is mild. Mild to moderate aortic valve stenosis.  7. The inferior vena cava is normal in size with greater than 50% respiratory variability, suggesting right atrial pressure of 3 mmHg. FINDINGS  Left Ventricle: Inferior basal hypokinesis abnormal septal motion. Left ventricular ejection fraction, by estimation, is 45 to 50%. The left ventricle has mildly decreased function. The left ventricle has no regional wall motion abnormalities. The left ventricular internal cavity size was normal in size. There is mild left ventricular hypertrophy. Left ventricular diastolic parameters were normal. Right Ventricle: The right ventricular size is moderately enlarged. No increase in right ventricular wall thickness. Right ventricular systolic function is moderately reduced. There is moderately elevated pulmonary artery systolic pressure. The tricuspid  regurgitant velocity is 3.26 m/s, and with an assumed right atrial pressure of 3 mmHg, the estimated right ventricular systolic pressure is 32.6 mmHg. Left Atrium: Left atrial size was moderately dilated.  Right Atrium: Right atrial size was moderately dilated. Pericardium: There is no evidence of pericardial effusion. Mitral Valve: The mitral valve is degenerative in appearance. There is mild thickening of the mitral valve leaflet(s). There is mild calcification of the mitral valve leaflet(s). Moderate mitral annular calcification. Mild to moderate mitral valve regurgitation. No  evidence of mitral valve stenosis. Tricuspid Valve: The tricuspid valve is normal in structure. Tricuspid valve regurgitation is mild . No evidence of tricuspid stenosis. Aortic Valve: The aortic valve is calcified. Aortic valve regurgitation is mild. Aortic regurgitation PHT measures 439 msec. Mild to moderate aortic stenosis is present. Aortic valve mean gradient measures 14.2 mmHg. Aortic valve peak gradient measures 23.2 mmHg. Aortic valve area, by VTI measures 1.17 cm. Pulmonic Valve: The pulmonic valve was normal in structure. Pulmonic valve regurgitation is not visualized. No evidence of pulmonic stenosis. Aorta: The aortic root is normal in size and structure. Venous: The inferior vena cava is normal in size with greater than 50% respiratory variability, suggesting right atrial pressure of 3 mmHg. IAS/Shunts: No atrial level shunt detected by color flow Doppler.  LEFT VENTRICLE PLAX 2D LVIDd:         4.90 cm   Diastology LVIDs:         4.30 cm   LV e' medial:    5.66 cm/s LV PW:         1.10 cm   LV E/e' medial:  15.4 LV IVS:        1.20 cm   LV e' lateral:   9.57 cm/s LVOT diam:     2.40 cm   LV E/e' lateral: 9.1 LV SV:         52 LV SV Index:   22 LVOT Area:     4.52 cm  RIGHT VENTRICLE RV S prime:     9.46 cm/s TAPSE (M-mode): 1.9 cm LEFT ATRIUM             Index        RIGHT ATRIUM           Index LA diam:        4.30 cm 1.84 cm/m   RA Area:     39.40 cm LA Vol (A2C):   67.6 ml 28.93 ml/m  RA Volume:   151.00 ml 64.61 ml/m LA Vol (A4C):   63.9 ml 27.34 ml/m LA Biplane Vol: 72.3 ml 30.94 ml/m  AORTIC VALVE AV Area (Vmax):     1.41 cm AV Area (Vmean):   1.34 cm AV Area (VTI):     1.17 cm AV Vmax:           240.80 cm/s AV Vmean:          175.400 cm/s AV VTI:            0.446 m AV Peak Grad:      23.2 mmHg AV Mean Grad:      14.2 mmHg LVOT Vmax:         75.30 cm/s LVOT Vmean:        52.000 cm/s LVOT VTI:          0.116 m LVOT/AV VTI ratio: 0.26 AI PHT:            439 msec  AORTA Ao Root diam: 3.60 cm MITRAL VALVE               TRICUSPID VALVE MV Area (PHT): 5.62 cm    TV Peak grad:   47.6 mmHg MV Decel Time: 135 msec    TV Vmax:        3.45 m/s MV E velocity: 87.30 cm/s  TR Peak grad:   42.5 mmHg MV A velocity: 95.40 cm/s  TR Vmax:        326.00 cm/s MV E/A ratio:  0.92  SHUNTS                            Systemic VTI:  0.12 m                            Systemic Diam: 2.40 cm Jenkins Rouge MD Electronically signed by Jenkins Rouge MD Signature Date/Time: 08/25/2021/2:17:14 PM    Final     Scheduled Meds:  apixaban  5 mg Oral BID   atorvastatin  40 mg Oral q1800   metoprolol tartrate  25 mg Oral BID   potassium chloride SA  20 mEq Oral Daily   sacubitril-valsartan  1 tablet Oral BID   torsemide  20 mg Oral Daily   Continuous Infusions:   LOS: 3 days   Time spent: 27 minutes   Darliss Cheney, MD [facility]  08/27/2021, 10:28 AM  Please page via [facility] and do not message via secure chat for anything urgent. Secure chat can be used for anything non urgent.  How to contact the [facility] Attending or Consulting provider [place] or covering provider during after hours [place], for this patient?  Check the care team in [facility] and look for a) attending/consulting [facility] provider listed and b) the [facility] team listed. Page or secure chat [phone]. Log into [email or web address] and use [place]'s universal password to access. If you do not have the password, please contact the hospital operator. Locate the [facility] provider you are looking for under [facility] and page to a number that you can be directly  reached. If you still have difficulty reaching the provider, please page the [facility] (Director on Call) for the Hospitalists listed on amion for assistance.

## 2021-08-27 NOTE — Progress Notes (Signed)
Wife concerned about entresto dosage making the patient's hypotensive. Patient was talking entresto a year ago but the patient's cardiologist d/c the medication due to symptomatic hypotension. Oval Linsey MD is on the unit rounding and made aware.

## 2021-08-28 ENCOUNTER — Other Ambulatory Visit (HOSPITAL_COMMUNITY): Payer: Self-pay

## 2021-08-28 LAB — BASIC METABOLIC PANEL
Anion gap: 9 (ref 5–15)
BUN: 26 mg/dL — ABNORMAL HIGH (ref 8–23)
CO2: 33 mmol/L — ABNORMAL HIGH (ref 22–32)
Calcium: 8.2 mg/dL — ABNORMAL LOW (ref 8.9–10.3)
Chloride: 90 mmol/L — ABNORMAL LOW (ref 98–111)
Creatinine, Ser: 0.87 mg/dL (ref 0.61–1.24)
GFR, Estimated: >60 mL/min (ref >60)
Glucose, Bld: 90 mg/dL (ref 70–99)
Potassium: 3.6 mmol/L (ref 3.5–5.1)
Sodium: 132 mmol/L — ABNORMAL LOW (ref 135–145)

## 2021-08-28 MED ORDER — TORSEMIDE 20 MG PO TABS
40.0000 mg | ORAL_TABLET | Freq: Every day | ORAL | 0 refills | Status: DC
  Filled 2021-08-28: qty 60, 30d supply, fill #0

## 2021-08-28 MED ORDER — METOPROLOL SUCCINATE ER 100 MG PO TB24
100.0000 mg | ORAL_TABLET | Freq: Every day | ORAL | 0 refills | Status: DC
  Filled 2021-08-28: qty 30, 30d supply, fill #0

## 2021-08-28 NOTE — Care Management Important Message (Signed)
Important Message  Patient Details  Name: [name] MRN: [id number] Date of Birth: 04-11-1936   Medicare Important Message Given:  Yes     [name] 08/28/2021, 9:40 AM

## 2021-08-28 NOTE — Progress Notes (Signed)
Went over discharge instructions with the patient and patient's wife at the bedside. Patient verbalize understanding of medication changes and follow up appointments with cardiology an PCP. Patient's wife has patient's wallet, discharge packet and medications. Patient's PIV and tele monitor has been removed. CCMD has been notified. Transportation is at the bedside.

## 2021-08-28 NOTE — Discharge Summary (Signed)
Physician Discharge Summary  [name] BEM:[id number] DOB: Sep 21, 1936 DOA: 08/24/2021  PCP: [name], MD  Admit date: 08/24/2021 Discharge date: 08/28/2021 30 Day Unplanned Readmission Risk Score    Flowsheet Row ED to Hosp-Admission (Current) from 08/24/2021 in [facility]  30 Day Unplanned Readmission Risk Score (%) 14.67 Filed at 08/28/2021 0400       This score is the patient's risk of an unplanned readmission within 30 days of being discharged (0 -100%). The score is based on dignosis, age, lab data, medications, orders, and past utilization.   Low:  0-14.9   Medium: 15-21.9   High: 22-29.9   Extreme: 30 and above          Admitted From: Home Disposition: Home  Recommendations for Outpatient Follow-up:  Follow up with PCP in 1-2 weeks Please obtain BMP/CBC in one week Follow-up with your primary cardiologist within 1 to 2 weeks Please follow up with your PCP on the following pending results: Unresulted Labs (From admission, onward)    None         Home Health: None Equipment/Devices: None  Discharge Condition: Stable CODE STATUS: DNR Diet recommendation: Low-sodium  Subjective: Seen and examined.  Wife at the bedside.  He feels well.  No shortness of breath.  At 3 to 4 L oxygen which is his baseline.  He is excited to go home today.  Brief/Interim Summary: [name] is a 85 y.o. male with medical history significant for chronic diastolic heart failure, PAF anticoagulated on Eliquis, chronic hypoxic respiratory failure on continuous 3-4 L nasal cannula, hyperlipidemia, ILD, who is admitted to [facility] on 08/24/2021 with acute on chronic diastolic heart failure after presenting from home to [facility] ED complaining of shortness of breath.   Vital signs in the ED were notable for the following: Afebrile; presenting heart rates in the 130s associated with atrial fibrillation with RVR, which decreased into the 90s to low 100s  following labetalol 5 mg IV x1 and initiation of IV diuresis, as further detailed below.  With this improvement in rate control patient reports corresponding complete resolution of his chest discomfort.  Chest x-ray showed increased interstitial markings suggestive of edema, with atypical infection felt to be less likely.  Additionally, chest x-ray showed no evidence of effusion or pneumothorax.  Further hospitalization course as below.   Acute on chronic combined systolic and diastolic congestive heart failure/chronic hypoxic respiratory failure: He feels better.  He remains on 4 L of oxygen during the whole hospitalization which is his baseline.  Has had almost 3 L out since admission.  Updated echo shows reduced ejection fraction to 45 to 50%, dropped from 60% previously in 2020.  Consulted cardiology, they have transitioned patient to torsemide oral and increase the dose to 40 mg and also started him on Entresto but this was discontinued due to low blood pressure and patient preference.  Cardiology has cleared the patient for discharge.   Paroxysmal atrial fibrillation/MAT: Presented with RVR, rates well controlled after IV diuresis.  Per cardiology note, patient appears to be having MAT on monitor.  During this hospitalization, patient was started on beta-blocker, initially Lopressor which was subsequently transitioned to Toprol-XL and he is currently on 100 mg, cardiology has recommended to discharge on this and they have cleared for discharge.  Patient's heart rate is very well controlled in 80s right now.    Prior history of CAD with chest pain: His chest pain resolved in the  ED and has not recurred. Troponins only slightly elevated.  Likely demand ischemia.  Echo as above.  Cardiology recommended to consider outpatient cardiac cath.  Hyperlipidemia: Continue atorvastatin.   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  Discharge Diagnoses:  Active  Problems:   Acute combined systolic and diastolic heart failure (HCC)   Atrial fibrillation with RVR (HCC)   Chronic respiratory failure with hypoxia (HCC)   Hypercholesterolemia   Atypical chest pain   Elevated troponin   Acute on chronic diastolic CHF (congestive heart failure) (HCC)   Pressure injury of skin   Multifocal atrial tachycardia ([place])    Discharge Instructions   Allergies as of 08/28/2021       Reactions   Fosinopril Other (See Comments)   Other reaction(s): Cough   Lasix [furosemide] Diarrhea, Other (See Comments)   Additionally, this made the patient become dehydrated (eventually)   Terbinafine Hcl Other (See Comments)        Medication List     TAKE these medications    acetaminophen 500 MG tablet Commonly known as: TYLENOL Take 1,000 mg by mouth every 6 (six) hours as needed for moderate pain or headache.   apixaban 5 MG Tabs tablet Commonly known as: ELIQUIS Take 1 tablet (5 mg total) by mouth 2 (two) times daily.   atorvastatin 40 MG tablet Commonly known as: LIPITOR Take 1 tablet (40 mg total) by mouth daily at 6 PM.   famotidine 20 MG tablet Commonly known as: PEPCID Take 20 mg by mouth daily as needed for heartburn or indigestion.   ferrous sulfate 324 MG Tbec Take 324 mg by mouth every Monday, Wednesday, and Friday.   fluticasone 50 MCG/ACT nasal spray Commonly known as: FLONASE Place 2 sprays into both nostrils daily as needed for allergies (for seasonal allergies).   ICaps Caps Take 1 capsule by mouth daily with breakfast.   Krill Oil 500 MG Caps Take 500 mg by mouth daily with breakfast.   metolazone 2.5 MG tablet Commonly known as: ZAROXOLYN Take 1 tablet (2.5 mg total) by mouth daily. What changed:  when to take this reasons to take this   metoprolol succinate 100 MG 24 hr tablet Commonly known as: TOPROL-XL Take 1 tablet (100 mg total) by mouth daily. Take with or immediately following a meal.   potassium chloride  SA 20 MEQ tablet Commonly known as: KLOR-CON Take 20 mEq by mouth daily.   PRESCRIPTION MEDICATION Apply 1 application topically See admin instructions. Apply to both ankles 3 days in a row, then 1 day off continuously. Cream from Dermatologist   REFRESH LIQUIGEL OP Place 2 drops into the right eye daily as needed (irritated eyes).   sodium chloride 0.65 % Soln nasal spray Commonly known as: OCEAN Place 1 spray into both nostrils daily as needed for congestion.   torsemide 20 MG tablet Commonly known as: DEMADEX Take 2 tablets (40 mg total) by mouth daily. What changed: how much to take   UNABLE TO FIND Take 1 capsule by mouth daily. Med Name: Super Beta Prostate        Follow-up Information     Tobb, Kardie, DO Follow up.   Specialty: Cardiology Why: the office should call you Monday or Tuesday for follow up visit, if you have not heard by Wed call the office. Contact information: [address] [place] [address] [phone]         [name], MD. Go on 09/04/2021.  Specialty: Internal Medicine Why: _0 :00am Contact information: [address] [place] [address] [phone]                Allergies  Allergen Reactions   Fosinopril Other (See Comments)    Other reaction(s): Cough   Lasix [Furosemide] Diarrhea and Other (See Comments)    Additionally, this made the patient become dehydrated (eventually)   Terbinafine Hcl Other (See Comments)    Consultations: Cardiology   Procedures/Studies: DG Chest Portable 1 View  Result Date: 08/24/2021 CLINICAL DATA:  Chest pressure.  Shortness of breath. EXAM: PORTABLE CHEST 1 VIEW COMPARISON:  Multiple chest x-rays since 2013. FINDINGS: No pneumothorax. Known pleural plaques. Diffuse increased interstitial opacities, mildly increased in the interval. Stable cardiomegaly. Prominent tortuous thoracic aorta is poorly evaluated due to the portable technique but similar to  previous studies. No other changes. IMPRESSION: 1. Increased interstitial markings in the lungs suggests edema versus atypical infection. Recommend clinical correlation. 2. Prominent tortuous thoracic aorta is similar to previous studies but poorly evaluated given today's technique. 3. Known calcified pleural plaques. 4. Known emphysematous changes in the lungs. Electronically Signed   By: Dorise Bullion III M.D.   On: 08/24/2021 19:10   ECHOCARDIOGRAM COMPLETE  Result Date: 08/25/2021    ECHOCARDIOGRAM REPORT   Patient Name:   [name] Date of Exam: 08/25/2021 Medical Rec #:  [id number]     Height:       75.0 in Accession #:    [id number]    Weight:       231.9 lb Date of Birth:  Aug 30, 1936     BSA:          2.337 m Patient Age:    30 years      BP:           132/101 mmHg Patient Gender: M             HR:           81 bpm. Exam Location:  Inpatient Procedure: 2D Echo, Cardiac Doppler and Color Doppler Indications:    A fib  History:        Patient has prior history of Echocardiogram examinations, most                 recent 03/24/2019. Signs/Symptoms:Murmur; Risk                 Factors:Dyslipidemia and Hypertension.  Sonographer:    Melissa Morford RDCS (AE, PE) Referring Phys: [id number] [place]  1. Inferior basal hypokinesis abnormal septal motion . Left ventricular ejection fraction, by estimation, is 45 to 50%. The left ventricle has mildly decreased function. The left ventricle has no regional wall motion abnormalities. There is mild left ventricular hypertrophy. Left ventricular diastolic parameters were normal.  2. Right ventricular systolic function is moderately reduced. The right ventricular size is moderately enlarged. There is moderately elevated pulmonary artery systolic pressure.  3. Left atrial size was moderately dilated.  4. Right atrial size was moderately dilated.  5. The mitral valve is degenerative. Mild to moderate mitral valve regurgitation. No evidence of mitral  stenosis. Moderate mitral annular calcification.  6. The aortic valve is calcified. Aortic valve regurgitation is mild. Mild to moderate aortic valve stenosis.  7. The inferior vena cava is normal in size with greater than 50% respiratory variability, suggesting right atrial pressure of 3 mmHg. FINDINGS  Left Ventricle: Inferior basal hypokinesis abnormal septal motion. Left ventricular  ejection fraction, by estimation, is 45 to 50%. The left ventricle has mildly decreased function. The left ventricle has no regional wall motion abnormalities. The left ventricular internal cavity size was normal in size. There is mild left ventricular hypertrophy. Left ventricular diastolic parameters were normal. Right Ventricle: The right ventricular size is moderately enlarged. No increase in right ventricular wall thickness. Right ventricular systolic function is moderately reduced. There is moderately elevated pulmonary artery systolic pressure. The tricuspid  regurgitant velocity is 3.26 m/s, and with an assumed right atrial pressure of 3 mmHg, the estimated right ventricular systolic pressure is 65.9 mmHg. Left Atrium: Left atrial size was moderately dilated. Right Atrium: Right atrial size was moderately dilated. Pericardium: There is no evidence of pericardial effusion. Mitral Valve: The mitral valve is degenerative in appearance. There is mild thickening of the mitral valve leaflet(s). There is mild calcification of the mitral valve leaflet(s). Moderate mitral annular calcification. Mild to moderate mitral valve regurgitation. No evidence of mitral valve stenosis. Tricuspid Valve: The tricuspid valve is normal in structure. Tricuspid valve regurgitation is mild . No evidence of tricuspid stenosis. Aortic Valve: The aortic valve is calcified. Aortic valve regurgitation is mild. Aortic regurgitation PHT measures 439 msec. Mild to moderate aortic stenosis is present. Aortic valve mean gradient measures 14.2 mmHg. Aortic valve  peak gradient measures 23.2 mmHg. Aortic valve area, by VTI measures 1.17 cm. Pulmonic Valve: The pulmonic valve was normal in structure. Pulmonic valve regurgitation is not visualized. No evidence of pulmonic stenosis. Aorta: The aortic root is normal in size and structure. Venous: The inferior vena cava is normal in size with greater than 50% respiratory variability, suggesting right atrial pressure of 3 mmHg. IAS/Shunts: No atrial level shunt detected by color flow Doppler.  LEFT VENTRICLE PLAX 2D LVIDd:         4.90 cm   Diastology LVIDs:         4.30 cm   LV e' medial:    5.66 cm/s LV PW:         1.10 cm   LV E/e' medial:  15.4 LV IVS:        1.20 cm   LV e' lateral:   9.57 cm/s LVOT diam:     2.40 cm   LV E/e' lateral: 9.1 LV SV:         52 LV SV Index:   22 LVOT Area:     4.52 cm  RIGHT VENTRICLE RV S prime:     9.46 cm/s TAPSE (M-mode): 1.9 cm LEFT ATRIUM             Index        RIGHT ATRIUM           Index LA diam:        4.30 cm 1.84 cm/m   RA Area:     39.40 cm LA Vol (A2C):   67.6 ml 28.93 ml/m  RA Volume:   151.00 ml 64.61 ml/m LA Vol (A4C):   63.9 ml 27.34 ml/m LA Biplane Vol: 72.3 ml 30.94 ml/m  AORTIC VALVE AV Area (Vmax):    1.41 cm AV Area (Vmean):   1.34 cm AV Area (VTI):     1.17 cm AV Vmax:           240.80 cm/s AV Vmean:          175.400 cm/s AV VTI:            0.446 m AV Peak Grad:  23.2 mmHg AV Mean Grad:      14.2 mmHg LVOT Vmax:         75.30 cm/s LVOT Vmean:        52.000 cm/s LVOT VTI:          0.116 m LVOT/AV VTI ratio: 0.26 AI PHT:            439 msec  AORTA Ao Root diam: 3.60 cm MITRAL VALVE               TRICUSPID VALVE MV Area (PHT): 5.62 cm    TV Peak grad:   47.6 mmHg MV Decel Time: 135 msec    TV Vmax:        3.45 m/s MV E velocity: 87.30 cm/s  TR Peak grad:   42.5 mmHg MV A velocity: 95.40 cm/s  TR Vmax:        326.00 cm/s MV E/A ratio:  0.92                            SHUNTS                            Systemic VTI:  0.12 m                            Systemic  Diam: 2.40 cm Jenkins Rouge MD Electronically signed by Jenkins Rouge MD Signature Date/Time: 08/25/2021/2:17:14 PM    Final      Discharge Exam: Vitals:   08/28/21 0714 08/28/21 0820  BP: 117/89 103/72  Pulse: 92   Resp:    Temp: 98.4 F (36.9 C)   SpO2: 93% 92%   Vitals:   08/28/21 0000 08/28/21 0400 08/28/21 0714 08/28/21 0820  BP: 119/88 127/88 117/89 103/72  Pulse: (!) 111 87 92   Resp: 17 17    Temp: 98.5 F (36.9 C) 98 F (36.7 C) 98.4 F (36.9 C)   TempSrc: Oral Oral Oral   SpO2: 91% 90% 93% 92%  Weight:  100.6 kg    Height:        General: Pt is alert, awake, not in acute distress Cardiovascular: Irregularly irregular rate and rhythm, S1/S2 +, no rubs, no gallops Respiratory: Coarse breath sounds bilaterally with rhonchi due to known history of asbestosis. Abdominal: Soft, NT, ND, bowel sounds + Extremities: Trace pitting edema bilateral lower extremity, no cyanosis    The results of significant diagnostics from this hospitalization (including imaging, microbiology, ancillary and laboratory) are listed below for reference.     Microbiology: Recent Results (from the past 240 hour(s))  Resp Panel by RT-PCR (Flu A&B, Covid) Nasopharyngeal Swab     Status: None   Collection Time: 08/24/21  8:45 PM   Specimen: Nasopharyngeal Swab; Nasopharyngeal(NP) swabs in vial transport medium  Result Value Ref Range Status   SARS Coronavirus 2 by RT PCR NEGATIVE NEGATIVE Final    Comment: (NOTE) SARS-CoV-2 target nucleic acids are NOT DETECTED.  The SARS-CoV-2 RNA is generally detectable in upper respiratory specimens during the acute phase of infection. The lowest concentration of SARS-CoV-2 viral copies this assay can detect is 138 copies/mL. A negative result does not preclude SARS-Cov-2 infection and should not be used as the sole basis for treatment or other patient management decisions. A negative result may occur with  improper specimen collection/handling,  submission of specimen other  than nasopharyngeal swab, presence of viral mutation(s) within the areas targeted by this assay, and inadequate number of viral copies(<138 copies/mL). A negative result must be combined with clinical observations, patient history, and epidemiological information. The expected result is Negative.  Fact Sheet for Patients:  EntrepreneurPulse.com.au  Fact Sheet for Healthcare Providers:  IncredibleEmployment.be  This test is no t yet approved or cleared by the [place] FDA and  has been authorized for detection and/or diagnosis of SARS-CoV-2 by FDA under an Emergency Use Authorization (EUA). This EUA will remain  in effect (meaning this test can be used) for the duration of the COVID-19 declaration under Section 564(b)(1) of the Act, 21 U.S.C.section 360bbb-3(b)(1), unless the authorization is terminated  or revoked sooner.       Influenza A by PCR NEGATIVE NEGATIVE Final   Influenza B by PCR NEGATIVE NEGATIVE Final    Comment: (NOTE) The Xpert Xpress SARS-CoV-2/FLU/RSV plus assay is intended as an aid in the diagnosis of influenza from Nasopharyngeal swab specimens and should not be used as a sole basis for treatment. Nasal washings and aspirates are unacceptable for Xpert Xpress SARS-CoV-2/FLU/RSV testing.  Fact Sheet for Patients: EntrepreneurPulse.com.au  Fact Sheet for Healthcare Providers: IncredibleEmployment.be  This test is not yet approved or cleared by the [place] FDA and has been authorized for detection and/or diagnosis of SARS-CoV-2 by FDA under an Emergency Use Authorization (EUA). This EUA will remain in effect (meaning this test can be used) for the duration of the COVID-19 declaration under Section 564(b)(1) of the Act, 21 U.S.C. section 360bbb-3(b)(1), unless the authorization is terminated or revoked.  Performed at [facility], [place] [address] [place], [place] [address]      Labs: BNP (last 3 results) Recent Labs    08/24/21 1842  BNP 631.4*   Basic Metabolic Panel: Recent Labs  Lab 08/24/21 1840 08/25/21 0328 08/26/21 0811 08/27/21 0248 08/28/21 0308  NA 137 140 138 136 132*  K 3.8 3.3* 3.6 3.5 3.6  CL 96* 98 91* 95* 90*  CO2 31 33* 30 32 33*  GLUCOSE 128* 81 103* 90 90  BUN 33* 32* 26* 24* 26*  CREATININE 1.22 1.14 0.95 0.88 0.87  CALCIUM 8.9 8.7* 8.6* 8.5* 8.2*  MG 1.8 1.7  --   --   --    Liver Function Tests: Recent Labs  Lab 08/24/21 1840 08/25/21 0328  AST 17 14*  [name] 9 11  [name] 60 50  [name] 0.7 0.8  [name] 7.6 6.8  ALBUMIN 3.1* 2.8*   No results for input(s): LIPASE, AMYLASE in the last 168 hours. No results for input(s): AMMONIA in the last 168 hours. CBC: Recent Labs  Lab 08/24/21 1840 08/25/21 0328  WBC 9.7 11.2*  NEUTROABS 7.0  --   HGB 12.0* 10.9*  HCT 39.9 35.5*  MCV 100.8* 98.3  PLT 242 220   Cardiac Enzymes: No results for input(s): CKTOTAL, [name], [name], TROPONINI in the last 168 hours. BNP: Invalid input(s): POCBNP CBG: Recent Labs  Lab 08/27/21 1357  GLUCAP 129*   D-Dimer No results for input(s): DDIMER in the last 72 hours. Hgb A1c No results for input(s): HGBA1C in the last 72 hours. Lipid Profile No results for input(s): CHOL, HDL, LDLCALC, TRIG, CHOLHDL, LDLDIRECT in the last 72 hours. Thyroid function studies No results for input(s): TSH, [name], [name], THYROIDAB in the last 72 hours.  Invalid input(s): FREET3 Anemia work up No results for input(s): VITAMINB12, FOLATE, FERRITIN, [name], IRON,  RETICCTPCT in the last 72 hours. Urinalysis    Component Value Date/Time   COLORURINE YELLOW 08/25/2021 [place] 08/25/2021 0905   [name] 1.012 08/25/2021 0905   PHURINE 5.0 08/25/2021 0905   [name] NEGATIVE 08/25/2021 0905   [name] NEGATIVE 08/25/2021 0905   [name] NEGATIVE 08/25/2021 0905   [name] NEGATIVE 08/25/2021  0905   [name] NEGATIVE 08/25/2021 0905   NITRITE NEGATIVE 08/25/2021 0905   LEUKOCYTESUR NEGATIVE 08/25/2021 0905   Sepsis Labs Invalid input(s): PROCALCITONIN,  WBC,  LACTICIDVEN Microbiology Recent Results (from the past 240 hour(s))  Resp Panel by RT-PCR (Flu A&B, Covid) Nasopharyngeal Swab     Status: None   Collection Time: 08/24/21  8:45 PM   Specimen: Nasopharyngeal Swab; Nasopharyngeal(NP) swabs in vial transport medium  Result Value Ref Range Status   SARS Coronavirus 2 by RT PCR NEGATIVE NEGATIVE Final    Comment: (NOTE) SARS-CoV-2 target nucleic acids are NOT DETECTED.  The SARS-CoV-2 RNA is generally detectable in upper respiratory specimens during the acute phase of infection. The lowest concentration of SARS-CoV-2 viral copies this assay can detect is 138 copies/mL. A negative result does not preclude SARS-Cov-2 infection and should not be used as the sole basis for treatment or other patient management decisions. A negative result may occur with  improper specimen collection/handling, submission of specimen other than nasopharyngeal swab, presence of viral mutation(s) within the areas targeted by this assay, and inadequate number of viral copies(<138 copies/mL). A negative result must be combined with clinical observations, patient history, and epidemiological information. The expected result is Negative.  Fact Sheet for Patients:  EntrepreneurPulse.com.au  Fact Sheet for Healthcare Providers:  IncredibleEmployment.be  This test is no t yet approved or cleared by the [place] FDA and  has been authorized for detection and/or diagnosis of SARS-CoV-2 by FDA under an Emergency Use Authorization (EUA). This EUA will remain  in effect (meaning this test can be used) for the duration of the COVID-19 declaration under Section 564(b)(1) of the Act, 21 U.S.C.section 360bbb-3(b)(1), unless the authorization is terminated  or  revoked sooner.       Influenza A by PCR NEGATIVE NEGATIVE Final   Influenza B by PCR NEGATIVE NEGATIVE Final    Comment: (NOTE) The Xpert Xpress SARS-CoV-2/FLU/RSV plus assay is intended as an aid in the diagnosis of influenza from Nasopharyngeal swab specimens and should not be used as a sole basis for treatment. Nasal washings and aspirates are unacceptable for Xpert Xpress SARS-CoV-2/FLU/RSV testing.  Fact Sheet for Patients: EntrepreneurPulse.com.au  Fact Sheet for Healthcare Providers: IncredibleEmployment.be  This test is not yet approved or cleared by the [place] FDA and has been authorized for detection and/or diagnosis of SARS-CoV-2 by FDA under an Emergency Use Authorization (EUA). This EUA will remain in effect (meaning this test can be used) for the duration of the COVID-19 declaration under Section 564(b)(1) of the Act, 21 U.S.C. section 360bbb-3(b)(1), unless the authorization is terminated or revoked.  Performed at [facility], [place] [address], [place], [place] [address]      Time coordinating discharge: Over 30 minutes  SIGNED:   Darliss Cheney, MD  [facility] 08/28/2021, 10:52 AM  If 7PM-7AM, please contact night-coverage [email or web address]

## 2021-08-28 NOTE — Plan of Care (Signed)

## 2021-08-28 NOTE — Discharge Instructions (Signed)

## 2021-09-05 DIAGNOSIS — M1711 Unilateral primary osteoarthritis, right knee: Secondary | ICD-10-CM | POA: Diagnosis not present

## 2021-09-07 DIAGNOSIS — I712 Thoracic aortic aneurysm, without rupture, unspecified: Secondary | ICD-10-CM | POA: Diagnosis not present

## 2021-09-07 DIAGNOSIS — I509 Heart failure, unspecified: Secondary | ICD-10-CM | POA: Diagnosis not present

## 2021-09-07 DIAGNOSIS — M25561 Pain in right knee: Secondary | ICD-10-CM | POA: Diagnosis not present

## 2021-09-07 DIAGNOSIS — J9690 Respiratory failure, unspecified, unspecified whether with hypoxia or hypercapnia: Secondary | ICD-10-CM | POA: Diagnosis not present

## 2021-09-07 DIAGNOSIS — I10 Essential (primary) hypertension: Secondary | ICD-10-CM | POA: Diagnosis not present

## 2021-09-07 DIAGNOSIS — J8410 Pulmonary fibrosis, unspecified: Secondary | ICD-10-CM | POA: Diagnosis not present

## 2021-09-07 DIAGNOSIS — J431 Panlobular emphysema: Secondary | ICD-10-CM | POA: Diagnosis not present

## 2021-09-15 DIAGNOSIS — K219 Gastro-esophageal reflux disease without esophagitis: Secondary | ICD-10-CM | POA: Diagnosis not present

## 2021-09-15 DIAGNOSIS — J431 Panlobular emphysema: Secondary | ICD-10-CM | POA: Diagnosis not present

## 2021-09-15 DIAGNOSIS — I509 Heart failure, unspecified: Secondary | ICD-10-CM | POA: Diagnosis not present

## 2021-09-15 DIAGNOSIS — I10 Essential (primary) hypertension: Secondary | ICD-10-CM | POA: Diagnosis not present

## 2021-09-15 DIAGNOSIS — M0579 Rheumatoid arthritis with rheumatoid factor of multiple sites without organ or systems involvement: Secondary | ICD-10-CM | POA: Diagnosis not present

## 2021-09-15 DIAGNOSIS — I272 Pulmonary hypertension, unspecified: Secondary | ICD-10-CM | POA: Diagnosis not present

## 2021-09-15 DIAGNOSIS — E7800 Pure hypercholesterolemia, unspecified: Secondary | ICD-10-CM | POA: Diagnosis not present

## 2021-09-15 DIAGNOSIS — I5022 Chronic systolic (congestive) heart failure: Secondary | ICD-10-CM | POA: Diagnosis not present

## 2021-09-19 DIAGNOSIS — I5043 Acute on chronic combined systolic (congestive) and diastolic (congestive) heart failure: Secondary | ICD-10-CM | POA: Insufficient documentation

## 2021-09-21 DIAGNOSIS — J9611 Chronic respiratory failure with hypoxia: Secondary | ICD-10-CM | POA: Diagnosis not present

## 2021-09-21 DIAGNOSIS — J8410 Pulmonary fibrosis, unspecified: Secondary | ICD-10-CM | POA: Diagnosis not present

## 2021-09-26 ENCOUNTER — Encounter: Payer: Self-pay | Admitting: Cardiology

## 2021-09-26 ENCOUNTER — Ambulatory Visit: Payer: Medicare Other | Admitting: Cardiology

## 2021-09-26 ENCOUNTER — Other Ambulatory Visit: Payer: Self-pay

## 2021-09-26 VITALS — BP 130/64 | HR 52 | Ht 75.0 in | Wt 228.0 lb

## 2021-09-26 DIAGNOSIS — I5041 Acute combined systolic (congestive) and diastolic (congestive) heart failure: Secondary | ICD-10-CM

## 2021-09-26 DIAGNOSIS — I480 Paroxysmal atrial fibrillation: Secondary | ICD-10-CM | POA: Diagnosis not present

## 2021-09-26 DIAGNOSIS — I2510 Atherosclerotic heart disease of native coronary artery without angina pectoris: Secondary | ICD-10-CM | POA: Diagnosis not present

## 2021-09-26 NOTE — Progress Notes (Signed)
Cardiology Office Note:    Date:  09/26/2021   ID:  [name], DOB 22-Nov-1935, MRN [id number]  PCP:  Jenean Lindau, MD  Cardiologist:  Jenean Lindau, MD   Referring MD: Jenean Lindau, MD    ASSESSMENT:    1. Acute combined systolic and diastolic heart failure ([place])   2. Coronary artery disease involving native coronary artery of native heart without angina pectoris   3. PAF (paroxysmal atrial fibrillation) (HCC)    PLAN:    In order of problems listed above:  Coronary artery disease: Secondary prevention stressed to the patient.  Importance of compliance with diet medication stressed any vocalized understanding. Congestive heart failure: Mild cardiomyopathy: I discussed findings with the patient at extensive length and he vocalized understanding and questions were answered to satisfaction.  Salt intake issues were discussed.  In view of the fact that he is on multiple medications he will have a Chem-7 today.  Congestive heart failure education was given to the patient.  Hospitalization record reviews was also done. Paroxysmal atrial fibrillation:I discussed with the patient atrial fibrillation, disease process. Management and therapy including rate and rhythm control, anticoagulation benefits and potential risks were discussed extensively with the patient. Patient had multiple questions which were answered to patient's satisfaction.  Patient's EKG has revealed sinus rhythm today. Mixed dyslipidemia and COPD: Stable at this time.  COPD managed by primary care.  On supplemental oxygen.  Lipids reviewed. Patient will be seen in follow-up appointment in 6 months or earlier if the patient has any concerns    Medication Adjustments/Labs and Tests Ordered: Current medicines are reviewed at length with the patient today.  Concerns regarding medicines are outlined above.  No orders of the defined types were placed in this encounter.  No orders of the defined types were placed  in this encounter.    No chief complaint on file.    History of Present Illness:    [name] is a 85 y.o. male.  Patient has past medical history of paroxysmal atrial fibrillation on anticoagulation.  He was admitted to the hospital with congestive heart failure and treated.  Patient is on diuretic therapy.  Its been a month now and the patient is doing well.  He has COPD.  He comes in in a wheelchair his 24-hour oxygen supplementation.  He has history of essential hypertension and dyslipidemia.  He presents much better and denies any symptoms.  At the time of my evaluation, the patient is alert awake oriented and in no distress.  Past Medical History:  Diagnosis Date   AAA (abdominal aortic aneurysm)    Acute combined systolic and diastolic heart failure (HCC)    Acute on chronic combined systolic and diastolic CHF (congestive heart failure) (HCC)    Acute on chronic diastolic CHF (congestive heart failure) ([place]) 08/25/2021   Acute on chronic systolic (congestive) heart failure ([place]) 08/24/2021   Allergic rhinitis    Aortic aneurysm without rupture ([place]) 04/26/2016   Atrial fibrillation with RVR ([place])    Atypical chest pain 08/25/2021   Benign prostatic hyperplasia 04/26/2016   CAD (coronary artery disease) 12/02/2019   Cardiomyopathy ([place]) 12/02/2019   CHF (congestive heart failure) ([place]) 12/02/2019   Chronic allergic rhinitis 04/26/2016   Chronic respiratory failure ([place]) 06/13/2016   Chronic respiratory failure with hypoxia (HCC)    Chronic systolic heart failure ([place]) 10/11/2020   Colon polyp    Congestive heart failure due to cardiomyopathy ([place]) 04/19/2021  Dyspnea 09/12/2016   Elevated troponin 08/25/2021   Emphysema of lung ([place]) 04/26/2016   Essential hypertension 04/26/2016   Gastroesophageal reflux disease 06/13/2016   Gout 04/19/2021   H/O asbestos exposure 04/26/2016   Hardening of the aorta (main artery of the heart) ([place]) 10/11/2020   Heart murmur  04/26/2016   Hypercholesterolemia 10/11/2020   Hypertension    ILD (interstitial lung disease) ([place]) 04/26/2016   Long term (current) use of anticoagulants 04/19/2021   Multifocal atrial tachycardia ([place])    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   Thrombophilia ([place]) 10/11/2020   Typical atrial flutter ([place]) 10/11/2020    Past Surgical History:  Procedure Laterality Date   CATARACT EXTRACTION, BILATERAL     COLONOSCOPY     HERNIA REPAIR     as child    REPLACEMENT TOTAL KNEE Left    RIGHT/LEFT HEART CATH AND CORONARY ANGIOGRAPHY N/A 11/16/2019   Procedure: RIGHT/LEFT HEART CATH AND CORONARY ANGIOGRAPHY;  Surgeon: Martinique, Peter M, MD;  Location: [facility];  Service: Cardiovascular;  Laterality: N/A;   TONSILLECTOMY      Current Medications: Current Meds  Medication Sig   acetaminophen (TYLENOL) 500 MG tablet Take 1,000 mg by mouth every 6 (six) hours as needed for moderate pain or headache.   apixaban (ELIQUIS) 5 MG TABS tablet Take 1 tablet (5 mg total) by mouth 2 (two) times daily.   atorvastatin (LIPITOR) 40 MG tablet Take 1 tablet (40 mg total) by mouth daily at 6 PM.   Carboxymethylcellulose Sodium (REFRESH LIQUIGEL OP) Place 2 drops into the right eye daily as needed (irritated eyes).   famotidine (PEPCID) 20 MG tablet Take 20 mg by mouth daily as needed for heartburn or indigestion.   ferrous sulfate 324 MG TBEC Take 324 mg by mouth every Monday, Wednesday, and Friday.   fluticasone (FLONASE) 50 MCG/ACT nasal spray Place 2 sprays into both nostrils daily as needed for allergies (for seasonal allergies).   Krill Oil 500 MG CAPS Take 500 mg by mouth daily with breakfast.   metolazone (ZAROXOLYN) 2.5 MG tablet Take 1 tablet (2.5 mg total) by mouth daily. (Patient taking differently: Take 2.5 mg by  mouth daily as needed (weight gain over 3 pounds in 1 day).)   metoprolol succinate (TOPROL-XL) 100 MG 24 hr tablet Take 1 tablet (100 mg total) by mouth daily. Take with or immediately following a meal.   Multiple Vitamins-Minerals (ICAPS) CAPS Take 1 capsule by mouth daily with breakfast.   potassium chloride SA (KLOR-CON) 20 MEQ tablet Take 20 mEq by mouth daily.   PRESCRIPTION MEDICATION Apply 1 application topically See admin instructions. Apply to both ankles 3 days in a row, then 1 day off continuously. Cream from Dermatologist   sodium chloride (OCEAN) 0.65 % SOLN nasal spray Place 1 spray into both nostrils daily as needed for  congestion.   torsemide (DEMADEX) 20 MG tablet Take 2 tablets (40 mg total) by mouth daily.   UNABLE TO FIND Take 1 capsule by mouth daily. Med Name: Super Beta Prostate     Allergies:   Fosinopril, Lasix [furosemide], and Terbinafine hcl   Social History   Socioeconomic History   Marital status: Married    Spouse name: Not on file   Number of children: 2   Years of education: Not on file   Highest education level: Not on file  Occupational History   Occupation: retired  Tobacco Use   Smoking status: Former    Packs/day: 1.00    Years: 10.00    Pack years: 10.00    Types: Cigarettes, Pipe, Cigars    Start date: 10/02/1955    Quit date: 10/01/1965    Years since quitting: 56.0   Smokeless tobacco: Former    Types: Chew  Substance and Sexual Activity   Alcohol use: No   Drug use: No   Sexual activity: Not on file  Other Topics Concern   Not on file  Social History Narrative   Originally from [place]. Has always lived in [place]. Served in [employer] and has traveled aboard ship extensively. 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 Has asbestos exposure through his work for 3.5 years from 1957-1961. 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 No mold or bird exposure. Enjoys golfing.    Social Determinants of Health   Financial Resource Strain: Not on file  Food Insecurity: Not on file  Transportation Needs: Not on file  Physical Activity: Not on file  Stress: Not on file  Social Connections: Not on file     Family History: The patient's family history includes Heart disease in his mother; Leukemia in his father. There is no history of Lung disease or Rheumatologic disease.  ROS:   Please see the history of present illness.    All other systems reviewed and are negative.  EKGs/Labs/Other Studies Reviewed:    The following studies were reviewed today: EKG reveals sinus rhythm and PACs and nonspecific ST-T  changes.  First-degree AV block seen.   Recent Labs: 08/24/2021: B Natriuretic Peptide 778.6; TSH 2.779 08/25/2021: [name] 11; Hemoglobin 10.9; Magnesium 1.7; Platelets 220 08/28/2021: BUN 26; Creatinine, Ser 0.87; Potassium 3.6; Sodium 132  Recent Lipid Panel    Component Value Date/Time   CHOL 103 04/05/2020 1637   TRIG 100 04/05/2020 1637   HDL 40 04/05/2020 1637   CHOLHDL 2.6 04/05/2020 1637   CHOLHDL 3.4 11/13/2019 1726   VLDL 11 11/13/2019 1726   LDLCALC 44 04/05/2020 1637    Physical Exam:    VS:  BP 130/64    Pulse (!) 52  Ht _0  (1.905 m)    Wt 228 lb (103.4 kg)    SpO2 95% Comment: 3 liters [place]   BMI 28.50 kg/m     Wt Readings from Last 3 Encounters:  09/26/21 228 lb (103.4 kg)  08/28/21 221 lb 12.5 oz (100.6 kg)  04/21/21 243 lb (110.2 kg)     GEN: Patient is in no acute distress HEENT: Normal NECK: No JVD; No carotid bruits LYMPHATICS: No lymphadenopathy CARDIAC: Hear sounds regular, 2/6 systolic murmur at the apex. RESPIRATORY:  Clear to auscultation without rales, wheezing or rhonchi  ABDOMEN: Soft, non-tender, non-distended MUSCULOSKELETAL:  No edema; No deformity  SKIN: Warm and dry NEUROLOGIC:  Alert and oriented x 3 PSYCHIATRIC:  Normal affect   Signed, Jenean Lindau, MD  09/26/2021 1:55 PM    [facility]

## 2021-09-26 NOTE — Patient Instructions (Signed)
Medication Instructions:  Your physician recommends that you continue on your current medications as directed. Please refer to the Current Medication list given to you today.  *If you need a refill on your cardiac medications before your next appointment, please call your pharmacy*   Lab Work: None ordered If you have labs (blood work) drawn today and your tests are completely normal, you will receive your results only by: [place] (if you have MyChart) OR A paper copy in the mail If you have any lab test that is abnormal or we need to change your treatment, we will call you to review the results.   Testing/Procedures: None ordered   Follow-Up: At [facility], you and your health needs are our priority.  As part of our continuing mission to provide you with exceptional heart care, we have created designated Provider Care Teams.  These Care Teams include your primary Cardiologist (physician) and Advanced Practice Providers (APPs -  Physician Assistants and Nurse Practitioners) who all work together to provide you with the care you need, when you need it.  We recommend signing up for the patient portal called "MyChart".  Sign up information is provided on this After Visit Summary.  MyChart is used to connect with patients for Virtual Visits (Telemedicine).  Patients are able to view lab/test results, encounter notes, upcoming appointments, etc.  Non-urgent messages can be sent to your provider as well.   To learn more about what you can do with MyChart, go to [email or web address].    Your next appointment:   2 month(s)  The format for your next appointment:   In Person  Provider:   Jyl Heinz, MD   Other Instructions NA

## 2021-09-27 LAB — BASIC METABOLIC PANEL
BUN/Creatinine Ratio: 45 — ABNORMAL HIGH (ref 10–24)
BUN: 44 mg/dL — ABNORMAL HIGH (ref 8–27)
CO2: 34 mmol/L — ABNORMAL HIGH (ref 20–29)
Calcium: 9.3 mg/dL (ref 8.6–10.2)
Chloride: 96 mmol/L (ref 96–106)
Creatinine, Ser: 0.97 mg/dL (ref 0.76–1.27)
Glucose: 105 mg/dL — ABNORMAL HIGH (ref 70–99)
Potassium: 4.1 mmol/L (ref 3.5–5.2)
Sodium: 143 mmol/L (ref 134–144)
eGFR: 77 mL/min/{1.73_m2} (ref >59)

## 2021-10-19 DIAGNOSIS — J431 Panlobular emphysema: Secondary | ICD-10-CM | POA: Diagnosis not present

## 2021-10-19 DIAGNOSIS — I272 Pulmonary hypertension, unspecified: Secondary | ICD-10-CM | POA: Diagnosis not present

## 2021-10-19 DIAGNOSIS — I10 Essential (primary) hypertension: Secondary | ICD-10-CM | POA: Diagnosis not present

## 2021-10-19 DIAGNOSIS — K219 Gastro-esophageal reflux disease without esophagitis: Secondary | ICD-10-CM | POA: Diagnosis not present

## 2021-10-19 DIAGNOSIS — M0579 Rheumatoid arthritis with rheumatoid factor of multiple sites without organ or systems involvement: Secondary | ICD-10-CM | POA: Diagnosis not present

## 2021-10-19 DIAGNOSIS — I509 Heart failure, unspecified: Secondary | ICD-10-CM | POA: Diagnosis not present

## 2021-10-19 DIAGNOSIS — E7800 Pure hypercholesterolemia, unspecified: Secondary | ICD-10-CM | POA: Diagnosis not present

## 2021-10-19 DIAGNOSIS — I5022 Chronic systolic (congestive) heart failure: Secondary | ICD-10-CM | POA: Diagnosis not present

## 2021-10-22 DIAGNOSIS — J9611 Chronic respiratory failure with hypoxia: Secondary | ICD-10-CM | POA: Diagnosis not present

## 2021-10-22 DIAGNOSIS — J8410 Pulmonary fibrosis, unspecified: Secondary | ICD-10-CM | POA: Diagnosis not present

## 2021-11-10 DIAGNOSIS — I7121 Aneurysm of the ascending aorta, without rupture: Secondary | ICD-10-CM | POA: Diagnosis not present

## 2021-11-10 DIAGNOSIS — I10 Essential (primary) hypertension: Secondary | ICD-10-CM | POA: Diagnosis not present

## 2021-11-10 DIAGNOSIS — I700 Atherosclerosis of aorta: Secondary | ICD-10-CM | POA: Diagnosis not present

## 2021-11-10 DIAGNOSIS — J431 Panlobular emphysema: Secondary | ICD-10-CM | POA: Diagnosis not present

## 2021-11-10 DIAGNOSIS — J9611 Chronic respiratory failure with hypoxia: Secondary | ICD-10-CM | POA: Diagnosis not present

## 2021-11-10 DIAGNOSIS — J8410 Pulmonary fibrosis, unspecified: Secondary | ICD-10-CM | POA: Diagnosis not present

## 2021-11-10 DIAGNOSIS — D6869 Other thrombophilia: Secondary | ICD-10-CM | POA: Diagnosis not present

## 2021-11-10 DIAGNOSIS — I483 Typical atrial flutter: Secondary | ICD-10-CM | POA: Diagnosis not present

## 2021-11-10 DIAGNOSIS — I509 Heart failure, unspecified: Secondary | ICD-10-CM | POA: Diagnosis not present

## 2021-11-22 DIAGNOSIS — J9611 Chronic respiratory failure with hypoxia: Secondary | ICD-10-CM | POA: Diagnosis not present

## 2021-11-22 DIAGNOSIS — J8410 Pulmonary fibrosis, unspecified: Secondary | ICD-10-CM | POA: Diagnosis not present

## 2021-11-29 ENCOUNTER — Encounter: Payer: Self-pay | Admitting: Cardiology

## 2021-11-29 ENCOUNTER — Other Ambulatory Visit: Payer: Self-pay

## 2021-11-29 ENCOUNTER — Ambulatory Visit: Payer: Medicare Other | Admitting: Cardiology

## 2021-11-29 VITALS — BP 128/78 | HR 60 | Ht 75.0 in | Wt 232.0 lb

## 2021-11-29 DIAGNOSIS — I5041 Acute combined systolic (congestive) and diastolic (congestive) heart failure: Secondary | ICD-10-CM | POA: Diagnosis not present

## 2021-11-29 DIAGNOSIS — I2510 Atherosclerotic heart disease of native coronary artery without angina pectoris: Secondary | ICD-10-CM

## 2021-11-29 DIAGNOSIS — I214 Non-ST elevation (NSTEMI) myocardial infarction: Secondary | ICD-10-CM

## 2021-11-29 DIAGNOSIS — I480 Paroxysmal atrial fibrillation: Secondary | ICD-10-CM | POA: Diagnosis not present

## 2021-11-29 DIAGNOSIS — Z1321 Encounter for screening for nutritional disorder: Secondary | ICD-10-CM | POA: Diagnosis not present

## 2021-11-29 DIAGNOSIS — I429 Cardiomyopathy, unspecified: Secondary | ICD-10-CM

## 2021-11-29 DIAGNOSIS — I10 Essential (primary) hypertension: Secondary | ICD-10-CM

## 2021-11-29 NOTE — Progress Notes (Signed)
Cardiology Office Note:    Date:  11/29/2021   ID:  [name], DOB Mar 28, 1936, MRN [id number]  PCP:  [name], MD  Cardiologist:  Jenean Lindau, MD   Referring MD: Jenean Lindau, MD    ASSESSMENT:    1. Coronary artery disease involving native coronary artery of native heart without angina pectoris   2. Cardiomyopathy, unspecified type ([place])   3. Essential hypertension   4. PAF (paroxysmal atrial fibrillation) ([place])   5. Acute combined systolic and diastolic heart failure (HCC)    PLAN:    In order of problems listed above:  Coronary artery disease: Secondary prevention stressed with the patient.  Importance of compliance with diet medication stressed and vocalized understanding. Essential hypertension: Blood pressure stable and diet was emphasized.  Lifestyle modification urged. Mixed dyslipidemia: Lipids reviewed.  Patient on statin therapy.  He will be back in the next few days for complete blood work. Paroxysmal atrial fibrillation:I discussed with the patient atrial fibrillation, disease process. Management and therapy including rate and rhythm control, anticoagulation benefits and potential risks were discussed extensively with the patient. Patient had multiple questions which were answered to patient's satisfaction. Ascending aortic dilatation: Stable at this time we will recheck this at next appointment. COPD on oxygen: Stable and followed by primary care.Patient will be seen in follow-up appointment in 6 months or earlier if the patient has any concerns.  He will come for complete blood work and we will do vitamin D and A1c per his request.   Medication Adjustments/Labs and Tests Ordered: Current medicines are reviewed at length with the patient today.  Concerns regarding medicines are outlined above.  No orders of the defined types were placed in this encounter.  No orders of the defined types were placed in this encounter.    No chief complaint on  file.    History of Present Illness:    [name] is a 86 y.o. male.  Patient has past medical history of coronary artery disease, mild cardiomyopathy, essential hypertension, mixed dyslipidemia and aortic dilatation.  He is on oxygen and brought in in a wheelchair.  He denies any chest pain orthopnea or PND.  At the time of my evaluation, the patient is alert awake oriented and in no distress.  Past Medical History:  Diagnosis Date   AAA (abdominal aortic aneurysm)    Acute combined systolic and diastolic heart failure (HCC)    Acute on chronic combined systolic and diastolic CHF (congestive heart failure) (HCC)    Acute on chronic diastolic CHF (congestive heart failure) ([place]) 08/25/2021   Acute on chronic systolic (congestive) heart failure ([place]) 08/24/2021   Allergic rhinitis    Aortic aneurysm without rupture ([place]) 04/26/2016   Atrial fibrillation with RVR ([place])    Atypical chest pain 08/25/2021   Benign prostatic hyperplasia 04/26/2016   CAD (coronary artery disease) 12/02/2019   Cardiomyopathy ([place]) 12/02/2019   CHF (congestive heart failure) ([place]) 12/02/2019   Chronic allergic rhinitis 04/26/2016   Chronic respiratory failure ([place]) 06/13/2016   Chronic respiratory failure with hypoxia (HCC)    Chronic systolic heart failure ([place]) 10/11/2020   Colon polyp    Congestive heart failure due to cardiomyopathy ([place]) 04/19/2021   Dyspnea 09/12/2016   Elevated troponin 08/25/2021   Emphysema of lung ([place]) 04/26/2016   Essential hypertension 04/26/2016   Gastroesophageal reflux disease 06/13/2016   Gout 04/19/2021   H/O asbestos exposure 04/26/2016   Hardening of the aorta (main artery of  the heart) ([place]) 10/11/2020   Heart murmur 04/26/2016   Hypercholesterolemia 10/11/2020   Hypertension    ILD (interstitial lung disease) ([place]) 04/26/2016   Long term (current) use of anticoagulants 04/19/2021   Multifocal atrial tachycardia ([place])    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   Thrombophilia ([place]) 10/11/2020   Typical atrial flutter ([place]) 10/11/2020    Past Surgical History:  Procedure Laterality Date   CATARACT EXTRACTION, BILATERAL     COLONOSCOPY     HERNIA REPAIR     as child   REPLACEMENT TOTAL KNEE Left    RIGHT/LEFT HEART CATH AND CORONARY ANGIOGRAPHY N/A 11/16/2019   Procedure: RIGHT/LEFT HEART CATH AND CORONARY ANGIOGRAPHY;  Surgeon: Martinique, Peter M, MD;  Location: [facility];  Service: Cardiovascular;   Laterality: N/A;   TONSILLECTOMY      Current Medications: Current Meds  Medication Sig   acetaminophen (TYLENOL) 500 MG tablet Take 1,000 mg by mouth every 6 (six) hours as needed for moderate pain or headache.   apixaban (ELIQUIS) 5 MG TABS tablet Take 1 tablet (5 mg total) by mouth 2 (two) times daily.   atorvastatin (LIPITOR) 40 MG tablet Take 1 tablet (40 mg total) by mouth daily at 6 PM.   Carboxymethylcellulose Sodium (REFRESH LIQUIGEL OP) Place 2 drops into the right eye daily as needed (irritated eyes).   famotidine (PEPCID) 20 MG tablet Take 20 mg by mouth daily as needed for heartburn or indigestion.   ferrous sulfate 324 MG TBEC Take 324 mg by mouth every Monday, Wednesday, and Friday.   fluticasone (FLONASE) 50 MCG/ACT nasal spray Place 2 sprays into both nostrils daily as needed for allergies (for seasonal allergies).   Krill Oil 500 MG CAPS Take 500 mg by mouth daily with breakfast.   metolazone (ZAROXOLYN) 2.5 MG tablet Take 1 tablet (2.5 mg total) by mouth daily.   metoprolol succinate (TOPROL-XL) 100 MG 24 hr tablet Take 100 mg by mouth daily. Take with or immediately following a meal.   Multiple Vitamins-Minerals (ICAPS) CAPS Take 1 capsule by mouth daily with breakfast.   potassium chloride SA (KLOR-CON) 20 MEQ tablet Take 20 mEq by mouth  daily.   PRESCRIPTION MEDICATION Apply 1 application topically See admin instructions. Apply to both ankles 3 days in a row, then 1 day off continuously. Cream from Dermatologist   sodium chloride (OCEAN) 0.65 % SOLN nasal spray Place 1 spray into both nostrils daily as needed for congestion.   torsemide (DEMADEX) 20 MG tablet Take 40 mg by mouth daily.   triamcinolone cream (KENALOG) 0.5 % Apply 1 application topically as directed. Use for 3 days then take 1 day off then repeat application for the next 3 days   UNABLE TO FIND Take 1 capsule by mouth daily. Med Name: Super Beta Prostate     Allergies:   Fosinopril, Lasix [furosemide],  and Terbinafine hcl   Social History   Socioeconomic History   Marital status: Married    Spouse name: Not on file   Number of children: 2   Years of education: Not on file   Highest education level: Not on file  Occupational History   Occupation: retired  Tobacco Use   Smoking status: Former    Packs/day: 1.00    Years: 10.00    Pack years: 10.00    Types: Cigarettes, Pipe, Cigars    Start date: 10/02/1955    Quit date: 10/01/1965    Years since quitting: 29.2   Smokeless tobacco: Former    Types: Chew  Substance and Sexual Activity   Alcohol use: No   Drug use: No   Sexual activity: Not on file  Other Topics Concern   Not on file  Social History Narrative   Originally from [place]. Has always lived in [place]. Served in [employer] and has traveled aboard ship extensively. 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 Has asbestos exposure through his work for 3.5 years from 1957-1961. 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 No mold or bird exposure. Enjoys golfing.    Social Determinants of Health   Financial Resource Strain: Not on file  Food Insecurity: Not on file  Transportation Needs: Not on file  Physical Activity: Not on file  Stress: Not on file  Social Connections: Not on file     Family History: The patient's family history includes Heart disease in his mother; Leukemia in his father. There is no history of Lung disease or Rheumatologic disease.  ROS:   Please see the history of present illness.    All other systems reviewed and are negative.  EKGs/Labs/Other Studies Reviewed:    The following studies were reviewed today:   IMPRESSIONS    1. Inferior basal hypokinesis abnormal septal motion . Left ventricular  ejection fraction, by estimation, is 45 to 50%. The left ventricle has  mildly decreased function. The left ventricle has no regional wall motion  abnormalities. There is mild left   ventricular hypertrophy. Left ventricular diastolic parameters were  normal.   2. Right ventricular systolic function is moderately reduced. The right  ventricular size is moderately enlarged. There is moderately elevated  pulmonary artery systolic pressure.   3. Left atrial size was moderately dilated.   4. Right atrial size was moderately dilated.   5. The mitral valve is degenerative. Mild to moderate mitral valve  regurgitation. No evidence of mitral stenosis. Moderate mitral annular  calcification.   6. The aortic valve is calcified. Aortic valve regurgitation is mild.  Mild to moderate aortic valve stenosis.   7. The inferior vena cava is normal  in size with greater than 50%  respiratory variability, suggesting right atrial pressure of 3 mmHg.    Recent Labs: 08/24/2021: B Natriuretic Peptide 778.6; TSH 2.779 08/25/2021: [name] 11; Hemoglobin 10.9; Magnesium 1.7; Platelets 220 09/26/2021: BUN 44; Creatinine, Ser 0.97; Potassium 4.1; Sodium 143  Recent Lipid Panel    Component Value Date/Time   CHOL 103 04/05/2020 1637   TRIG 100 04/05/2020 1637   HDL 40 04/05/2020 1637   CHOLHDL 2.6 04/05/2020 1637   CHOLHDL 3.4 11/13/2019 1726   VLDL 11 11/13/2019 1726   LDLCALC 44 04/05/2020 1637    Physical Exam:    VS:  BP 128/78    Pulse 60    Ht _0  (1.905 m)    Wt 232 lb (105.2 kg)    BMI 29.00 kg/m     Wt Readings from Last 3 Encounters:  11/29/21 232 lb (105.2 kg)  09/26/21 228 lb (103.4 kg)  08/28/21 221 lb 12.5 oz (100.6 kg)     GEN: Patient is in no acute distress HEENT: Normal NECK: No JVD; No carotid bruits LYMPHATICS: No lymphadenopathy CARDIAC: Hear sounds regular, 2/6 systolic murmur at the apex. RESPIRATORY:  Clear to auscultation without rales, wheezing or rhonchi  ABDOMEN: Soft, non-tender, non-distended MUSCULOSKELETAL:  No edema; No deformity  SKIN: Warm and dry NEUROLOGIC:  Alert and oriented x 3 PSYCHIATRIC:  Normal affect   Signed, Jenean Lindau, MD  11/29/2021 2:32 PM    [facility]

## 2021-11-29 NOTE — Patient Instructions (Signed)
Medication Instructions:  ?Your physician recommends that you continue on your current medications as directed. Please refer to the Current Medication list given to you today.  ?*If you need a refill on your cardiac medications before your next appointment, please call your pharmacy* ? ? ?Lab Work: ?Your physician recommends that you return for lab work in: the next few days. ?You need to have labs done when you are fasting.  You can come Monday through Friday 8:30 am to 12:00 pm and 1:15 to 4:30. You do not need to make an appointment as the order has already been placed. The labs you are going to have done are BMET, CBC, TSH, LFT and Lipids. ? ?If you have labs (blood work) drawn today and your tests are completely normal, you will receive your results only by: ?MyChart Message (if you have MyChart) OR ?A paper copy in the mail ?If you have any lab test that is abnormal or we need to change your treatment, we will call you to review the results. ? ? ?Testing/Procedures: ?None ordered ? ? ?Follow-Up: ?At [facility], you and your health needs are our priority.  As part of our continuing mission to provide you with exceptional heart care, we have created designated Provider Care Teams.  These Care Teams include your primary Cardiologist (physician) and Advanced Practice Providers (APPs -  Physician Assistants and Nurse Practitioners) who all work together to provide you with the care you need, when you need it. ? ?We recommend signing up for the patient portal called "MyChart".  Sign up information is provided on this After Visit Summary.  MyChart is used to connect with patients for Virtual Visits (Telemedicine).  Patients are able to view lab/test results, encounter notes, upcoming appointments, etc.  Non-urgent messages can be sent to your provider as well.   ?To learn more about what you can do with MyChart, go to [email or web address].   ? ?Your next appointment:   ?9 month(s) ? ?The format for your next  appointment:   ?In Person ? ?Provider:   ?Jyl Heinz, MD ? ? ?Other Instructions ?NA  ?

## 2021-11-30 ENCOUNTER — Ambulatory Visit: Payer: Medicare Other | Admitting: Cardiology

## 2021-12-04 DIAGNOSIS — I10 Essential (primary) hypertension: Secondary | ICD-10-CM | POA: Diagnosis not present

## 2021-12-04 DIAGNOSIS — Z1321 Encounter for screening for nutritional disorder: Secondary | ICD-10-CM | POA: Diagnosis not present

## 2021-12-04 DIAGNOSIS — Z131 Encounter for screening for diabetes mellitus: Secondary | ICD-10-CM | POA: Diagnosis not present

## 2021-12-04 DIAGNOSIS — I5041 Acute combined systolic (congestive) and diastolic (congestive) heart failure: Secondary | ICD-10-CM | POA: Diagnosis not present

## 2021-12-04 DIAGNOSIS — I2510 Atherosclerotic heart disease of native coronary artery without angina pectoris: Secondary | ICD-10-CM | POA: Diagnosis not present

## 2021-12-04 DIAGNOSIS — I429 Cardiomyopathy, unspecified: Secondary | ICD-10-CM | POA: Diagnosis not present

## 2021-12-04 DIAGNOSIS — I480 Paroxysmal atrial fibrillation: Secondary | ICD-10-CM | POA: Diagnosis not present

## 2021-12-05 LAB — BASIC METABOLIC PANEL
BUN/Creatinine Ratio: 41 — ABNORMAL HIGH (ref 10–24)
BUN: 35 mg/dL — ABNORMAL HIGH (ref 8–27)
CO2: 30 mmol/L — ABNORMAL HIGH (ref 20–29)
Calcium: 9.5 mg/dL (ref 8.6–10.2)
Chloride: 96 mmol/L (ref 96–106)
Creatinine, Ser: 0.86 mg/dL (ref 0.76–1.27)
Glucose: 91 mg/dL (ref 70–99)
Potassium: 4.1 mmol/L (ref 3.5–5.2)
Sodium: 142 mmol/L (ref 134–144)
eGFR: 86 mL/min/{1.73_m2} (ref >59)

## 2021-12-05 LAB — HEMOGLOBIN A1C
Est. average glucose Bld gHb Est-mCnc: 117 mg/dL
Hgb A1c MFr Bld: 5.7 % — ABNORMAL HIGH (ref 4.8–5.6)

## 2021-12-05 LAB — LIPID PANEL
Chol/HDL Ratio: 2.2 ratio (ref 0.0–5.0)
Cholesterol, Total: 110 mg/dL (ref 100–199)
HDL: 49 mg/dL (ref >39)
LDL Chol Calc (NIH): 46 mg/dL (ref 0–99)
Triglycerides: 72 mg/dL (ref 0–149)
VLDL Cholesterol Cal: 15 mg/dL (ref 5–40)

## 2021-12-05 LAB — HEPATIC FUNCTION PANEL
ALT: 10 IU/L (ref 0–44)
AST: 16 IU/L (ref 0–40)
Albumin: 3.9 g/dL (ref 3.6–4.6)
Alkaline Phosphatase: 87 IU/L (ref 44–121)
Bilirubin Total: 0.7 mg/dL (ref 0.0–1.2)
Bilirubin, Direct: 0.22 mg/dL (ref 0.00–0.40)
Total Protein: 7.6 g/dL (ref 6.0–8.5)

## 2021-12-05 LAB — TSH: TSH: 2.55 u[IU]/mL (ref 0.450–4.500)

## 2021-12-05 LAB — CBC WITH DIFFERENTIAL/PLATELET
Basophils Absolute: 0.1 10*3/uL (ref 0.0–0.2)
Basos: 1 %
EOS (ABSOLUTE): 0.4 10*3/uL (ref 0.0–0.4)
Eos: 4 %
Hematocrit: 40.4 % (ref 37.5–51.0)
Hemoglobin: 13.5 g/dL (ref 13.0–17.7)
Immature Grans (Abs): 0 10*3/uL (ref 0.0–0.1)
Immature Granulocytes: 0 %
Lymphocytes Absolute: 1.5 10*3/uL (ref 0.7–3.1)
Lymphs: 14 %
MCH: 31.1 pg (ref 26.6–33.0)
MCHC: 33.4 g/dL (ref 31.5–35.7)
MCV: 93 fL (ref 79–97)
Monocytes Absolute: 0.7 10*3/uL (ref 0.1–0.9)
Monocytes: 7 %
Neutrophils Absolute: 7.6 10*3/uL — ABNORMAL HIGH (ref 1.4–7.0)
Neutrophils: 74 %
Platelets: 235 10*3/uL (ref 150–450)
RBC: 4.34 x10E6/uL (ref 4.14–5.80)
RDW: 14.5 % (ref 11.6–15.4)
WBC: 10.3 10*3/uL (ref 3.4–10.8)

## 2021-12-05 LAB — VITAMIN D 25 HYDROXY (VIT D DEFICIENCY, FRACTURES): Vit D, 25-Hydroxy: 52.1 ng/mL (ref 30.0–100.0)

## 2021-12-20 DIAGNOSIS — J9611 Chronic respiratory failure with hypoxia: Secondary | ICD-10-CM | POA: Diagnosis not present

## 2021-12-20 DIAGNOSIS — J8410 Pulmonary fibrosis, unspecified: Secondary | ICD-10-CM | POA: Diagnosis not present

## 2022-01-20 DIAGNOSIS — J9611 Chronic respiratory failure with hypoxia: Secondary | ICD-10-CM | POA: Diagnosis not present

## 2022-01-20 DIAGNOSIS — J8410 Pulmonary fibrosis, unspecified: Secondary | ICD-10-CM | POA: Diagnosis not present

## 2022-02-19 DIAGNOSIS — J8410 Pulmonary fibrosis, unspecified: Secondary | ICD-10-CM | POA: Diagnosis not present

## 2022-02-19 DIAGNOSIS — J9611 Chronic respiratory failure with hypoxia: Secondary | ICD-10-CM | POA: Diagnosis not present

## 2022-02-24 ENCOUNTER — Other Ambulatory Visit: Payer: Self-pay | Admitting: Cardiology

## 2022-03-22 DIAGNOSIS — J9611 Chronic respiratory failure with hypoxia: Secondary | ICD-10-CM | POA: Diagnosis not present

## 2022-03-22 DIAGNOSIS — J8410 Pulmonary fibrosis, unspecified: Secondary | ICD-10-CM | POA: Diagnosis not present

## 2022-04-21 DIAGNOSIS — J8410 Pulmonary fibrosis, unspecified: Secondary | ICD-10-CM | POA: Diagnosis not present

## 2022-04-21 DIAGNOSIS — J9611 Chronic respiratory failure with hypoxia: Secondary | ICD-10-CM | POA: Diagnosis not present

## 2022-05-12 IMAGING — CT CT CHEST HIGH RESOLUTION W/O CM
2 of 7 series · 12 of 36 positions shown, 15 images · non-contrast
Comparison: 10/21/2018 chest CT.  12/21/2019 chest CT angiogram.

CLINICAL DATA: Follow-up interstitial lung disease, on oxygen
therapy. History of asbestos exposure per prior report. Former
smoker.

EXAM:
CT CHEST WITHOUT CONTRAST
TECHNIQUE: Multidetector CT imaging of the chest was performed following the
standard protocol without intravenous contrast. High resolution
imaging of the lungs, as well as inspiratory and expiratory imaging,
was performed.

[Series 4: chest 2.00 br36 s3 cor soft · coronal · 0.63mm/px · 3 of 245 slices shown]
[im 49/245  lung]
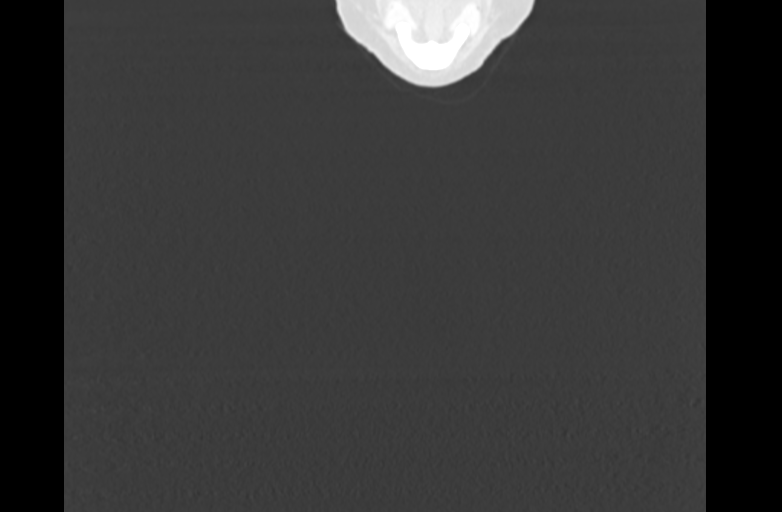
[im 98/245  lung]
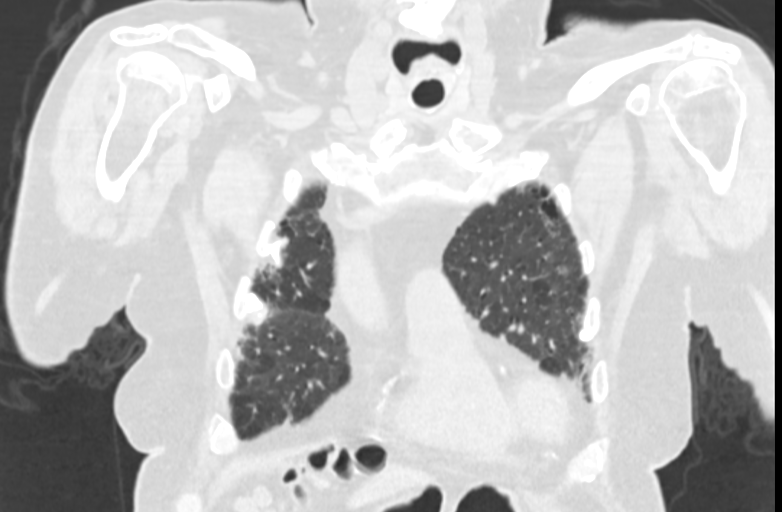
[im 147/245  lung]
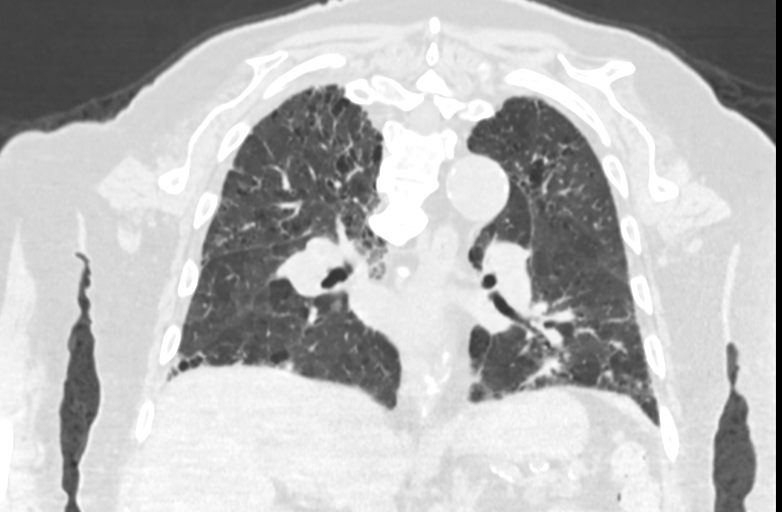

[Series 11: chest 1.00 br60 s3 high res thins 1x1 mm · axial · 0.96mm/px · z∈[+1566,+1838]mm · 9 of 322 slices shown, 12 images]
[im 25/322  mediastinal]
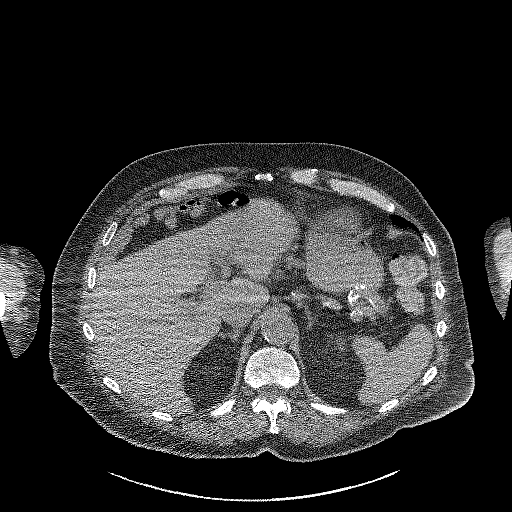
[im 25/322  lung]
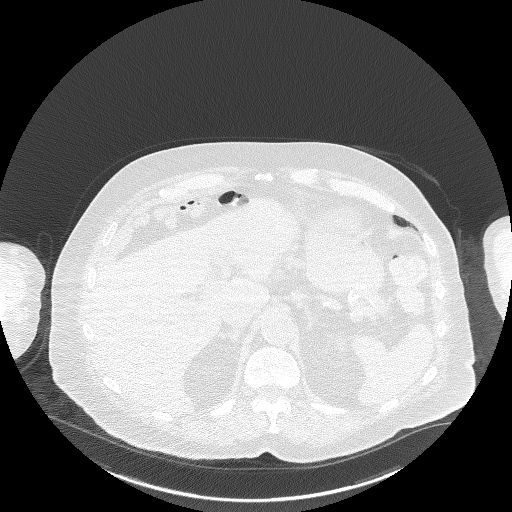
[im 75/322  lung]
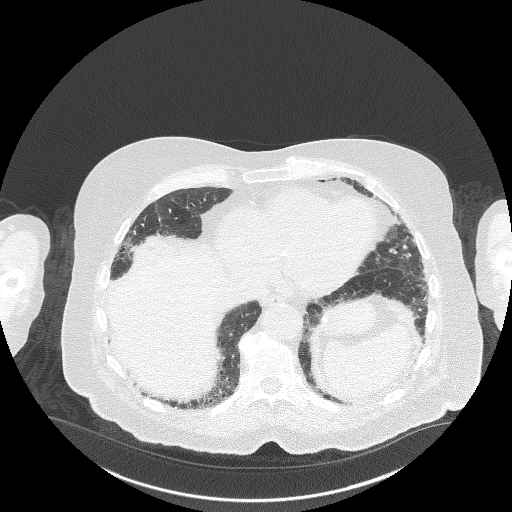
[im 99/322  lung]
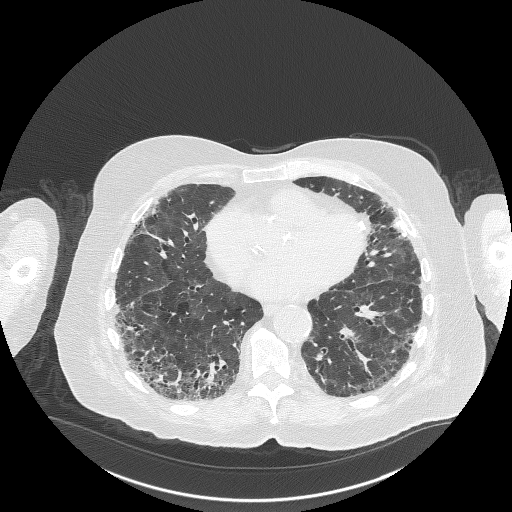
[im 124/322  lung]
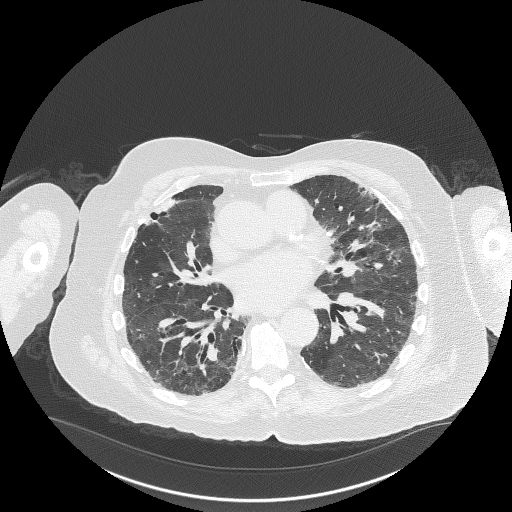
[im 173/322  mediastinal]
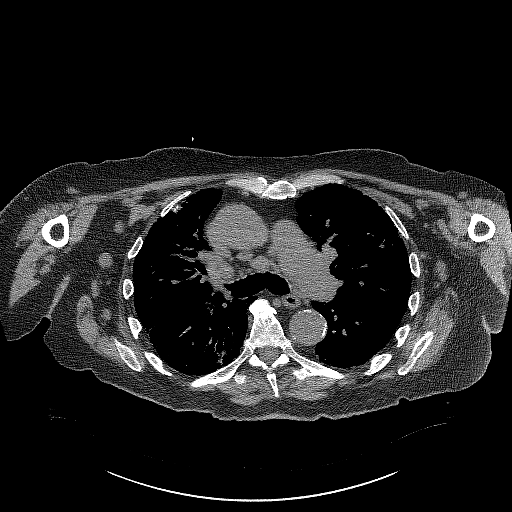
[im 173/322  lung]
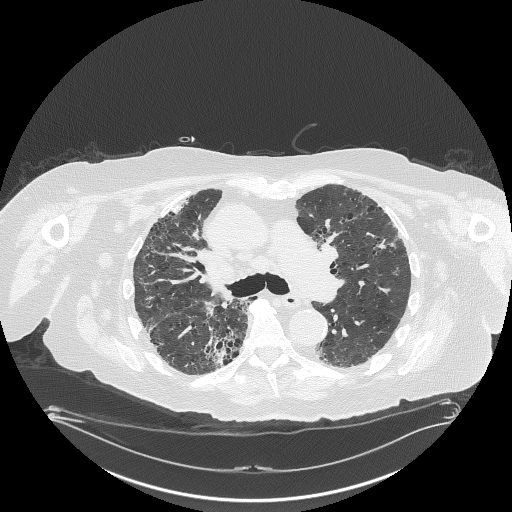
[im 198/322  lung]
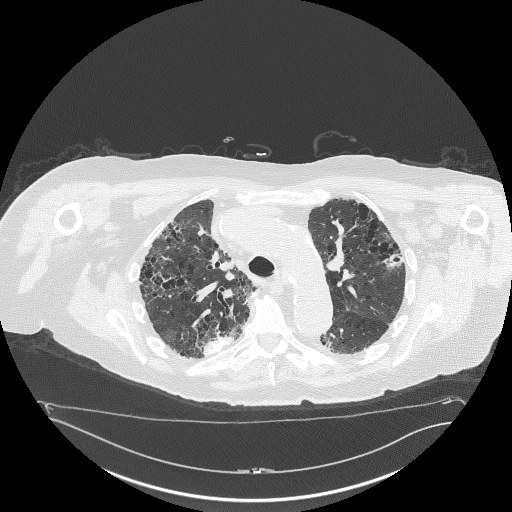
[im 223/322  lung]
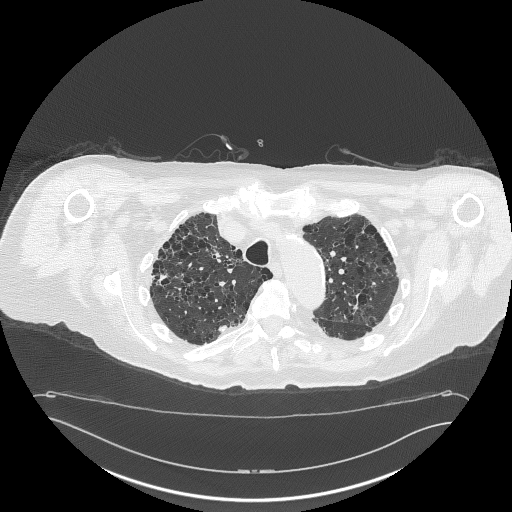
[im 272/322  lung]
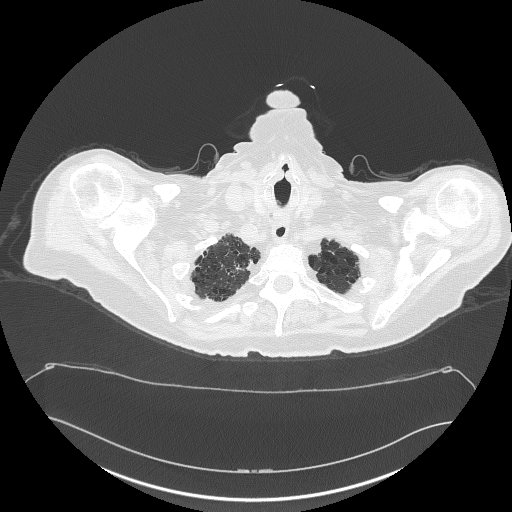
[im 297/322  mediastinal]
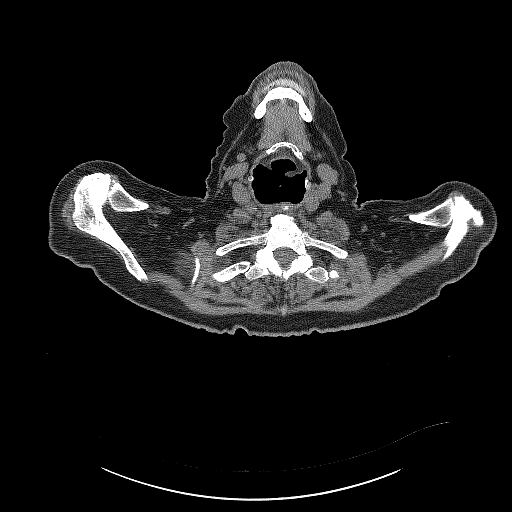
[im 297/322  lung]
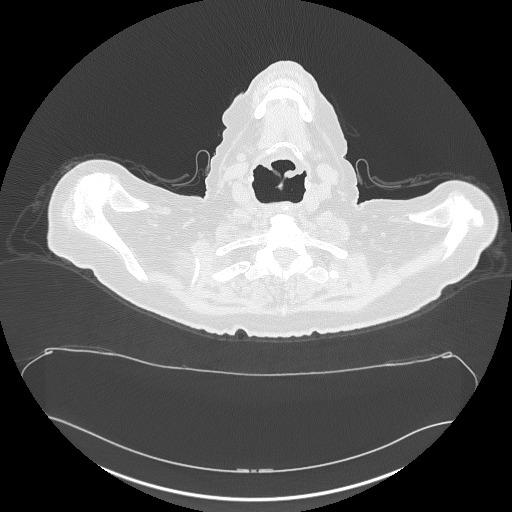

[12 of 36 positions shown; findings below may reference images not displayed]

FINDINGS: Cardiovascular: Normal heart size. No significant pericardial
effusion/thickening. Three-vessel coronary atherosclerosis.
Atherosclerotic thoracic aorta with stable ectatic 4.4 cm ascending
thoracic aorta. Stable dilated main pulmonary artery (4.5 cm
diameter).

Mediastinum/Nodes: No discrete thyroid nodules. Unremarkable
esophagus. New bilateral axillary lymphadenopathy up to 1.5 cm on
the right (series 2/image 72) and 1.4 cm on the left (series 2/image
63). Stable mild right paratracheal adenopathy up to 1.2 cm (series
2/image 75). Stable mildly enlarged 1.1 cm subcarinal node (series
2/image 83). No pathologically enlarged discrete hilar nodes on this
noncontrast scan.

Lungs/Pleura: No pneumothorax. Numerous bilateral thick calcified
pleural plaques anteriorly and posteriorly, not appreciably changed.
No pleural effusions. Moderate centrilobular and paraseptal
emphysema. New indistinct subsolid 1.8 x 1.5 cm peripheral left
upper lobe pulmonary nodule with 0.5 cm solid component (series
8/image 65). No additional significant pulmonary nodules. No
significant lobular air trapping or evidence of
tracheobronchomalacia on the expiration sequence. Patchy moderate
subpleural reticulation and ground-glass opacity throughout both
lungs with associated moderate traction bronchiectasis and
architectural distortion. There is a mild basilar predominance to
these findings. No frank honeycombing. Findings have clearly
progressed in the interval.

Upper abdomen: No acute abnormality.

Musculoskeletal: No aggressive appearing focal osseous lesions.
Marked thoracic spondylosis.
IMPRESSION: 1. Stable bilateral calcified pleural plaques without pleural
effusions, compatible with asbestos related pleural disease.
2. Interval progression of basilar predominant fibrotic interstitial
lung disease without frank honeycombing. Findings are consistent
with UIP due to asbestosis. Findings are categorized as probable UIP
per consensus guidelines: Diagnosis of Idiopathic Pulmonary
Fibrosis: An Official ATS/ERS/JRS/ALAT Clinical Practice Guideline.
Am J Respir Crit Care Med Vol 198, Maite 5, ppe44-[id number][DATE].
3. New indistinct subsolid 1.8 cm peripheral left upper lobe
pulmonary nodule with 0.5 cm solid component. Follow-up non-contrast
CT recommended at 3-6 months to confirm persistence. If unchanged,
and solid component remains <6 mm, annual CT is recommended until 5
years of stability has been established. If persistent these nodules
should be considered highly suspicious if the solid component of the
nodule is 6 mm or greater in size and enlarging. This recommendation
follows the consensus statement: Guidelines for Management of
Incidental Pulmonary Nodules Detected on CT Images: From the
4. New mild bilateral axillary lymphadenopathy, nonspecific.
Correlate with any recent history of COVID vaccination to explain
these findings, although the bilateral adenopathy makes this a less
likely explanation. Chronic mild mediastinal lymphadenopathy,
unchanged, probably reactive. The axillary nodes can be reassessed
on follow-up chest CT. If there is clinical concern for a
lymphoproliferative condition, axillary node biopsy could be
obtained at this time.
5. Stable dilated main pulmonary artery, suggesting chronic
pulmonary arterial hypertension.
6. Ectatic 4.4 cm ascending thoracic aorta. Recommend annual imaging
followup by CTA or MRA. This recommendation follows 8212
ACCF/AHA/AATS/ACR/ASA/SCA/LEMUS/NEEKEY/NOMASIBULELE/CHIKODI Guidelines for the
Diagnosis and Management of Patients with Thoracic Aortic Disease.
Circulation. 8212; 121: E266-e369. Aortic aneurysm NOS
(LW4X5-XUL.O).
7. Three-vessel coronary atherosclerosis.
8. Aortic Atherosclerosis (LW4X5-2YS.S) and Emphysema (LW4X5-48I.B).

## 2022-05-14 DIAGNOSIS — J431 Panlobular emphysema: Secondary | ICD-10-CM | POA: Diagnosis not present

## 2022-05-14 DIAGNOSIS — I483 Typical atrial flutter: Secondary | ICD-10-CM | POA: Diagnosis not present

## 2022-05-14 DIAGNOSIS — I272 Pulmonary hypertension, unspecified: Secondary | ICD-10-CM | POA: Diagnosis not present

## 2022-05-14 DIAGNOSIS — J61 Pneumoconiosis due to asbestos and other mineral fibers: Secondary | ICD-10-CM | POA: Diagnosis not present

## 2022-05-14 DIAGNOSIS — E7800 Pure hypercholesterolemia, unspecified: Secondary | ICD-10-CM | POA: Diagnosis not present

## 2022-05-14 DIAGNOSIS — I10 Essential (primary) hypertension: Secondary | ICD-10-CM | POA: Diagnosis not present

## 2022-05-14 DIAGNOSIS — J8410 Pulmonary fibrosis, unspecified: Secondary | ICD-10-CM | POA: Diagnosis not present

## 2022-05-14 DIAGNOSIS — I700 Atherosclerosis of aorta: Secondary | ICD-10-CM | POA: Diagnosis not present

## 2022-05-14 DIAGNOSIS — Z79899 Other long term (current) drug therapy: Secondary | ICD-10-CM | POA: Diagnosis not present

## 2022-05-14 DIAGNOSIS — I5022 Chronic systolic (congestive) heart failure: Secondary | ICD-10-CM | POA: Diagnosis not present

## 2022-05-14 DIAGNOSIS — Z0000 Encounter for general adult medical examination without abnormal findings: Secondary | ICD-10-CM | POA: Diagnosis not present

## 2022-05-14 DIAGNOSIS — J9611 Chronic respiratory failure with hypoxia: Secondary | ICD-10-CM | POA: Diagnosis not present

## 2022-05-22 DIAGNOSIS — J8410 Pulmonary fibrosis, unspecified: Secondary | ICD-10-CM | POA: Diagnosis not present

## 2022-05-22 DIAGNOSIS — J9611 Chronic respiratory failure with hypoxia: Secondary | ICD-10-CM | POA: Diagnosis not present

## 2022-08-07 ENCOUNTER — Encounter: Payer: Self-pay | Admitting: Cardiology

## 2022-08-22 DIAGNOSIS — E785 Hyperlipidemia, unspecified: Secondary | ICD-10-CM

## 2022-08-22 DIAGNOSIS — J920 Pleural plaque with presence of asbestos: Secondary | ICD-10-CM | POA: Insufficient documentation

## 2022-08-22 HISTORY — DX: Hyperlipidemia, unspecified: E78.5

## 2022-08-22 HISTORY — DX: Pleural plaque with presence of asbestos: J92.0

## 2022-08-27 ENCOUNTER — Encounter: Payer: Self-pay | Admitting: Cardiology

## 2022-08-27 ENCOUNTER — Ambulatory Visit: Payer: Medicare Other | Attending: Cardiology | Admitting: Cardiology

## 2022-08-27 VITALS — BP 82/40 | HR 74 | Ht 75.0 in | Wt 239.0 lb

## 2022-08-27 DIAGNOSIS — I480 Paroxysmal atrial fibrillation: Secondary | ICD-10-CM

## 2022-08-27 DIAGNOSIS — I7121 Aneurysm of the ascending aorta, without rupture: Secondary | ICD-10-CM

## 2022-08-27 DIAGNOSIS — I10 Essential (primary) hypertension: Secondary | ICD-10-CM | POA: Diagnosis not present

## 2022-08-27 DIAGNOSIS — E7800 Pure hypercholesterolemia, unspecified: Secondary | ICD-10-CM

## 2022-08-27 NOTE — Patient Instructions (Signed)
Medication Instructions:  Your physician recommends that you continue on your current medications as directed. Please refer to the Current Medication list given to you today.  *If you need a refill on your cardiac medications before your next appointment, please call your pharmacy*   Lab Work: None ordered If you have labs (blood work) drawn today and your tests are completely normal, you will receive your results only by: [place] (if you have MyChart) OR A paper copy in the mail If you have any lab test that is abnormal or we need to change your treatment, we will call you to review the results.   Testing/Procedures: None ordered   Follow-Up: At [facility], you and your health needs are our priority.  As part of our continuing mission to provide you with exceptional heart care, we have created designated Provider Care Teams.  These Care Teams include your primary Cardiologist (physician) and Advanced Practice Providers (APPs -  Physician Assistants and Nurse Practitioners) who all work together to provide you with the care you need, when you need it.  We recommend signing up for the patient portal called "MyChart".  Sign up information is provided on this After Visit Summary.  MyChart is used to connect with patients for Virtual Visits (Telemedicine).  Patients are able to view lab/test results, encounter notes, upcoming appointments, etc.  Non-urgent messages can be sent to your provider as well.   To learn more about what you can do with MyChart, go to [email or web address].    Your next appointment:   9 month(s)  The format for your next appointment:   In Person  Provider:   Jyl Heinz, MD   Other Instructions NA

## 2022-08-27 NOTE — Progress Notes (Signed)
Cardiology Office Note:    Date:  08/27/2022   ID:  [name], DOB Feb 04, 1936, MRN [id number]  PCP:  [name], MD  Cardiologist:  Jenean Lindau, MD   Referring MD: [name], MD    ASSESSMENT:    1. Aneurysm of ascending aorta without rupture ([place])   2. PAF (paroxysmal atrial fibrillation) ([place])   3. Essential (primary) hypertension   4. Hypercholesterolemia    PLAN:    In order of problems listed above:  Primary prevention stressed with the patient.  Importance of compliance with diet medication stressed any vocalized understanding. Atrial fibrillation:I discussed with the patient atrial fibrillation, disease process. Management and therapy including rate and rhythm control, anticoagulation benefits and potential risks were discussed extensively with the patient. Patient had multiple questions which were answered to patient's satisfaction. Ascending aortic aneurysm: I discussed findings with him from previous evaluation.  He does not want any further evaluation because he is not a candidate for surgical treatment because of multiple comorbidities including advanced COPD and supplemental oxygen use.  I respect his wishes. Mixed dyslipidemia: On lipid-lowering medications followed by primary care. Patient will be seen in follow-up appointment in 6 months or earlier if the patient has any concerns    Medication Adjustments/Labs and Tests Ordered: Current medicines are reviewed at length with the patient today.  Concerns regarding medicines are outlined above.  No orders of the defined types were placed in this encounter.  No orders of the defined types were placed in this encounter.    No chief complaint on file.    History of Present Illness:    [name] is a 86 y.o. male.  Patient has past medical history of ascending aortic aneurysm, atrial fibrillation, essential hypertension and COPD on oxygen.  He is on motorized wheelchair.  He does not ambulate  much.  No chest pain orthopnea or PND.  His wife accompanies him for this visit.  Past Medical History:  Diagnosis Date   AAA (abdominal aortic aneurysm) ([place])    Acute combined systolic and diastolic heart failure (HCC)    Acute on chronic combined systolic and diastolic CHF (congestive heart failure) (HCC)    Acute on chronic diastolic CHF (congestive heart failure) ([place]) 08/25/2021   Acute on chronic systolic (congestive) heart failure ([place]) 08/24/2021   Allergic rhinitis    Aortic aneurysm without rupture ([place]) 04/26/2016   Atrial fibrillation with RVR ([place])    Atypical chest pain 08/25/2021   Benign prostatic hyperplasia 04/26/2016   CAD (coronary artery disease) 12/02/2019   Cardiomyopathy ([place]) 12/02/2019   CHF (congestive heart failure) ([place]) 12/02/2019   Chronic allergic rhinitis 04/26/2016   Chronic respiratory failure ([place]) 06/13/2016   Chronic respiratory failure with hypoxia (HCC)    Chronic systolic heart failure ([place]) 10/11/2020   Colon polyp    Congestive heart failure due to cardiomyopathy ([place]) 04/19/2021   Dyspnea 09/12/2016   Elevated troponin 08/25/2021   Emphysema of lung ([place]) 04/26/2016   Essential (primary) hypertension 04/26/2016   Essential hypertension 04/26/2016   Gastroesophageal reflux disease 06/13/2016   Gout 04/19/2021   H/O asbestos exposure 04/26/2016   Hardening of the aorta (main artery of the heart) ([place]) 10/11/2020   Heart murmur 04/26/2016   Hypercholesterolemia 10/11/2020   Hyperlipidemia, unspecified 08/22/2022   Hypertension    ILD (interstitial lung disease) ([place]) 04/26/2016   Long term (current) use of anticoagulants 04/19/2021   Multifocal atrial tachycardia    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   Thrombophilia ([place]) 10/11/2020   Typical atrial flutter ([place]) 10/11/2020    Past Surgical History:  Procedure Laterality Date   CATARACT EXTRACTION, BILATERAL     COLONOSCOPY     HERNIA REPAIR     as child   REPLACEMENT TOTAL KNEE Left    RIGHT/LEFT HEART CATH AND CORONARY ANGIOGRAPHY N/A 11/16/2019   Procedure: RIGHT/LEFT HEART CATH AND CORONARY ANGIOGRAPHY;  Surgeon: Martinique, Peter M, MD;  Location: [facility];  Service: Cardiovascular;  Laterality: N/A;   TONSILLECTOMY      Current Medications: Current Meds  Medication Sig   acetaminophen (TYLENOL) 500 MG tablet Take 1,000 mg by mouth every 6 (six)  hours as needed for moderate pain or headache.   apixaban (ELIQUIS) 5 MG TABS tablet Take 1 tablet (5 mg total) by mouth 2 (two) times daily.   atorvastatin (LIPITOR) 40 MG tablet Take 1 tablet (40 mg total) by mouth daily at 6 PM.   Carboxymethylcellulose Sodium (REFRESH LIQUIGEL OP) Place 2 drops into the right eye daily as needed (irritated eyes).   famotidine (PEPCID) 20 MG tablet Take 20 mg by mouth daily as needed for heartburn or indigestion.   fluticasone (FLONASE) 50 MCG/ACT nasal spray Place 2 sprays into both nostrils daily as needed for allergies (for seasonal allergies).   metolazone (ZAROXOLYN) 2.5 MG tablet Take 1 tablet (2.5 mg total) by mouth daily.   metoprolol succinate (TOPROL-XL) 100 MG 24 hr tablet Take 100 mg by mouth daily. Take with or immediately following a meal.   Multiple Vitamins-Minerals (ICAPS) CAPS Take 1 capsule by mouth daily with breakfast.   potassium chloride SA (KLOR-CON) 20 MEQ tablet Take 20 mEq by mouth daily.   PRESCRIPTION MEDICATION Apply 1 application topically See admin instructions. Apply to both ankles 3 days in a row, then 1 day off continuously. Cream from Dermatologist   sodium chloride (OCEAN) 0.65 % SOLN nasal spray Place 1 spray into both nostrils daily as needed for congestion.   torsemide (DEMADEX) 20 MG tablet Take 40 mg by mouth daily.   triamcinolone cream (KENALOG) 0.5 %  Apply 1 application topically as directed. Use for 3 days then take 1 day off then repeat application for the next 3 days   UNABLE TO FIND Take 1 capsule by mouth daily. Med Name: Super Beta Prostate     Allergies:   Fosinopril, Lasix [furosemide], and Terbinafine hcl   Social History   Socioeconomic History   Marital status: Married    Spouse name: Not on file   Number of children: 2   Years of education: Not on file   Highest education level: Not on file  Occupational History   Occupation: retired  Tobacco Use   Smoking status: Former    Packs/day: 1.00     Years: 10.00    Total pack years: 10.00    Types: Cigarettes, Pipe, Cigars    Start date: 10/02/1955    Quit date: 10/01/1965    Years since quitting: 56.9   Smokeless tobacco: Former    Types: Chew  Substance and Sexual Activity   Alcohol use: No   Drug use: No   Sexual activity: Not on file  Other Topics Concern   Not on file  Social History Narrative   Originally from [place]. Has always lived in [place]. Served in [employer] and has traveled aboard ship extensively. 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 Has asbestos exposure through his work for 3.5 years from 1957-1961. 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 No mold or bird exposure. Enjoys golfing.    Social Determinants of Health   Financial Resource Strain: Not on file  Food Insecurity: Not on file  Transportation Needs: Not on file  Physical Activity: Not on file  Stress: Not on file  Social Connections: Not on file     Family History: The patient's family history includes Heart disease in his mother; Leukemia in his father. There is no history of Lung disease or Rheumatologic disease.  ROS:   Please see the history of present illness.    All other systems reviewed and are negative.  EKGs/Labs/Other Studies Reviewed:    The following studies were reviewed today: I discussed findings with the patient at length.   Recent Labs: 12/04/2021: [name] 10; BUN 35; Creatinine, Ser 0.86; Hemoglobin 13.5; Platelets 235; Potassium 4.1; Sodium 142; TSH 2.550  Recent Lipid Panel    Component Value Date/Time   CHOL 110 12/04/2021 1122   TRIG 72 12/04/2021 1122   HDL 49 12/04/2021 1122   CHOLHDL 2.2 12/04/2021 1122   CHOLHDL 3.4 11/13/2019 1726   VLDL 11 11/13/2019 1726   LDLCALC 46 12/04/2021 1122    Physical Exam:    VS:  BP (!) 82/40   Pulse 74   Ht _0  (1.905 m)   Wt 239 lb (108.4 kg)   SpO2 90%   BMI 29.87 kg/m     Wt Readings from Last 3  Encounters:  08/27/22 239 lb (108.4 kg)  11/29/21 232 lb (105.2 kg)  09/26/21 228 lb (103.4 kg)     GEN: Patient is in no acute distress HEENT: Normal NECK: No JVD; No carotid bruits LYMPHATICS: No lymphadenopathy CARDIAC: Hear sounds regular, 2/6 systolic murmur at the apex. RESPIRATORY:  Clear to auscultation without rales, wheezing or rhonchi  ABDOMEN: Soft, non-tender, non-distended MUSCULOSKELETAL:  No edema; No deformity  SKIN: Warm and dry NEUROLOGIC:  Alert and oriented x 3 PSYCHIATRIC:  Normal affect   Signed, Reita Cliche Tahari Clabaugh,  MD  08/27/2022 2:39 PM    [place]

## 2022-09-13 ENCOUNTER — Encounter: Payer: Self-pay | Admitting: Cardiology

## 2022-09-13 MED ORDER — METOPROLOL SUCCINATE ER 100 MG PO TB24: 100.0000 mg | ORAL_TABLET | Freq: Every day | ORAL | 2 refills | Status: AC

## 2022-10-08 DIAGNOSIS — I110 Hypertensive heart disease with heart failure: Secondary | ICD-10-CM | POA: Diagnosis not present

## 2022-10-08 DIAGNOSIS — Z5321 Procedure and treatment not carried out due to patient leaving prior to being seen by health care provider: Secondary | ICD-10-CM | POA: Diagnosis not present

## 2022-10-08 DIAGNOSIS — I509 Heart failure, unspecified: Secondary | ICD-10-CM | POA: Diagnosis not present

## 2022-10-08 DIAGNOSIS — Z743 Need for continuous supervision: Secondary | ICD-10-CM | POA: Diagnosis not present

## 2022-10-08 DIAGNOSIS — J449 Chronic obstructive pulmonary disease, unspecified: Secondary | ICD-10-CM | POA: Diagnosis not present

## 2022-10-08 DIAGNOSIS — N19 Unspecified kidney failure: Secondary | ICD-10-CM | POA: Diagnosis not present

## 2022-10-08 DIAGNOSIS — R197 Diarrhea, unspecified: Secondary | ICD-10-CM | POA: Diagnosis not present

## 2022-10-08 DIAGNOSIS — I2510 Atherosclerotic heart disease of native coronary artery without angina pectoris: Secondary | ICD-10-CM | POA: Diagnosis not present

## 2022-10-09 ENCOUNTER — Telehealth: Payer: Self-pay | Admitting: Cardiology

## 2022-10-09 DIAGNOSIS — R609 Edema, unspecified: Secondary | ICD-10-CM | POA: Diagnosis not present

## 2022-10-09 DIAGNOSIS — R0902 Hypoxemia: Secondary | ICD-10-CM | POA: Diagnosis not present

## 2022-10-09 DIAGNOSIS — I499 Cardiac arrhythmia, unspecified: Secondary | ICD-10-CM | POA: Diagnosis not present

## 2022-10-09 DIAGNOSIS — Z743 Need for continuous supervision: Secondary | ICD-10-CM | POA: Diagnosis not present

## 2022-10-09 NOTE — Telephone Encounter (Signed)
[facility] ER and faxed over the patients latest labs to the ER. They had no further questions at this time.

## 2022-10-09 NOTE — Telephone Encounter (Signed)
Called stating that pt is currently in the ED and would like to request pt's last 2 most recent lab results. Please advise

## 2022-10-12 DIAGNOSIS — I4891 Unspecified atrial fibrillation: Secondary | ICD-10-CM | POA: Diagnosis not present

## 2022-10-16 DIAGNOSIS — I440 Atrioventricular block, first degree: Secondary | ICD-10-CM | POA: Diagnosis not present

## 2022-10-16 DIAGNOSIS — I340 Nonrheumatic mitral (valve) insufficiency: Secondary | ICD-10-CM | POA: Diagnosis not present

## 2022-10-16 DIAGNOSIS — I361 Nonrheumatic tricuspid (valve) insufficiency: Secondary | ICD-10-CM | POA: Diagnosis not present

## 2022-10-17 DIAGNOSIS — J8410 Pulmonary fibrosis, unspecified: Secondary | ICD-10-CM | POA: Diagnosis not present

## 2022-10-17 DIAGNOSIS — I340 Nonrheumatic mitral (valve) insufficiency: Secondary | ICD-10-CM | POA: Diagnosis not present

## 2022-10-17 DIAGNOSIS — I4891 Unspecified atrial fibrillation: Secondary | ICD-10-CM | POA: Diagnosis not present

## 2022-10-17 DIAGNOSIS — N289 Disorder of kidney and ureter, unspecified: Secondary | ICD-10-CM | POA: Diagnosis not present

## 2022-10-18 DIAGNOSIS — I340 Nonrheumatic mitral (valve) insufficiency: Secondary | ICD-10-CM

## 2022-10-18 DIAGNOSIS — J9611 Chronic respiratory failure with hypoxia: Secondary | ICD-10-CM

## 2022-10-18 DIAGNOSIS — I272 Pulmonary hypertension, unspecified: Secondary | ICD-10-CM | POA: Diagnosis not present

## 2022-10-18 DIAGNOSIS — L89152 Pressure ulcer of sacral region, stage 2: Secondary | ICD-10-CM | POA: Diagnosis not present

## 2022-10-18 DIAGNOSIS — J8410 Pulmonary fibrosis, unspecified: Secondary | ICD-10-CM | POA: Diagnosis not present

## 2022-10-18 DIAGNOSIS — I4891 Unspecified atrial fibrillation: Secondary | ICD-10-CM | POA: Diagnosis not present

## 2022-10-19 DIAGNOSIS — J8410 Pulmonary fibrosis, unspecified: Secondary | ICD-10-CM | POA: Diagnosis not present

## 2022-10-19 DIAGNOSIS — I4891 Unspecified atrial fibrillation: Secondary | ICD-10-CM | POA: Diagnosis not present

## 2022-10-19 DIAGNOSIS — L89152 Pressure ulcer of sacral region, stage 2: Secondary | ICD-10-CM | POA: Diagnosis not present

## 2022-10-19 DIAGNOSIS — I272 Pulmonary hypertension, unspecified: Secondary | ICD-10-CM | POA: Diagnosis not present

## 2022-10-20 DIAGNOSIS — I4891 Unspecified atrial fibrillation: Secondary | ICD-10-CM | POA: Diagnosis not present

## 2022-10-20 DIAGNOSIS — L89152 Pressure ulcer of sacral region, stage 2: Secondary | ICD-10-CM | POA: Diagnosis not present

## 2022-10-20 DIAGNOSIS — I272 Pulmonary hypertension, unspecified: Secondary | ICD-10-CM | POA: Diagnosis not present

## 2022-10-20 DIAGNOSIS — I5033 Acute on chronic diastolic (congestive) heart failure: Secondary | ICD-10-CM

## 2022-10-20 DIAGNOSIS — J8410 Pulmonary fibrosis, unspecified: Secondary | ICD-10-CM | POA: Diagnosis not present

## 2022-10-21 DIAGNOSIS — L89152 Pressure ulcer of sacral region, stage 2: Secondary | ICD-10-CM | POA: Diagnosis not present

## 2022-10-21 DIAGNOSIS — J8410 Pulmonary fibrosis, unspecified: Secondary | ICD-10-CM | POA: Diagnosis not present

## 2022-10-21 DIAGNOSIS — I272 Pulmonary hypertension, unspecified: Secondary | ICD-10-CM | POA: Diagnosis not present

## 2022-10-21 DIAGNOSIS — I4891 Unspecified atrial fibrillation: Secondary | ICD-10-CM | POA: Diagnosis not present

## 2022-10-27 ENCOUNTER — Encounter: Payer: Self-pay | Admitting: Cardiology

## 2022-11-01 DEATH — deceased

## 2023-04-06 IMAGING — DX DG CHEST 1V PORT
1 series · 1 of 1 positions shown · non-contrast
Comparison: Multiple chest x-rays since 6967.

CLINICAL DATA: Chest pressure.  Shortness of breath.

EXAM:
PORTABLE CHEST 1 VIEW

[chest]
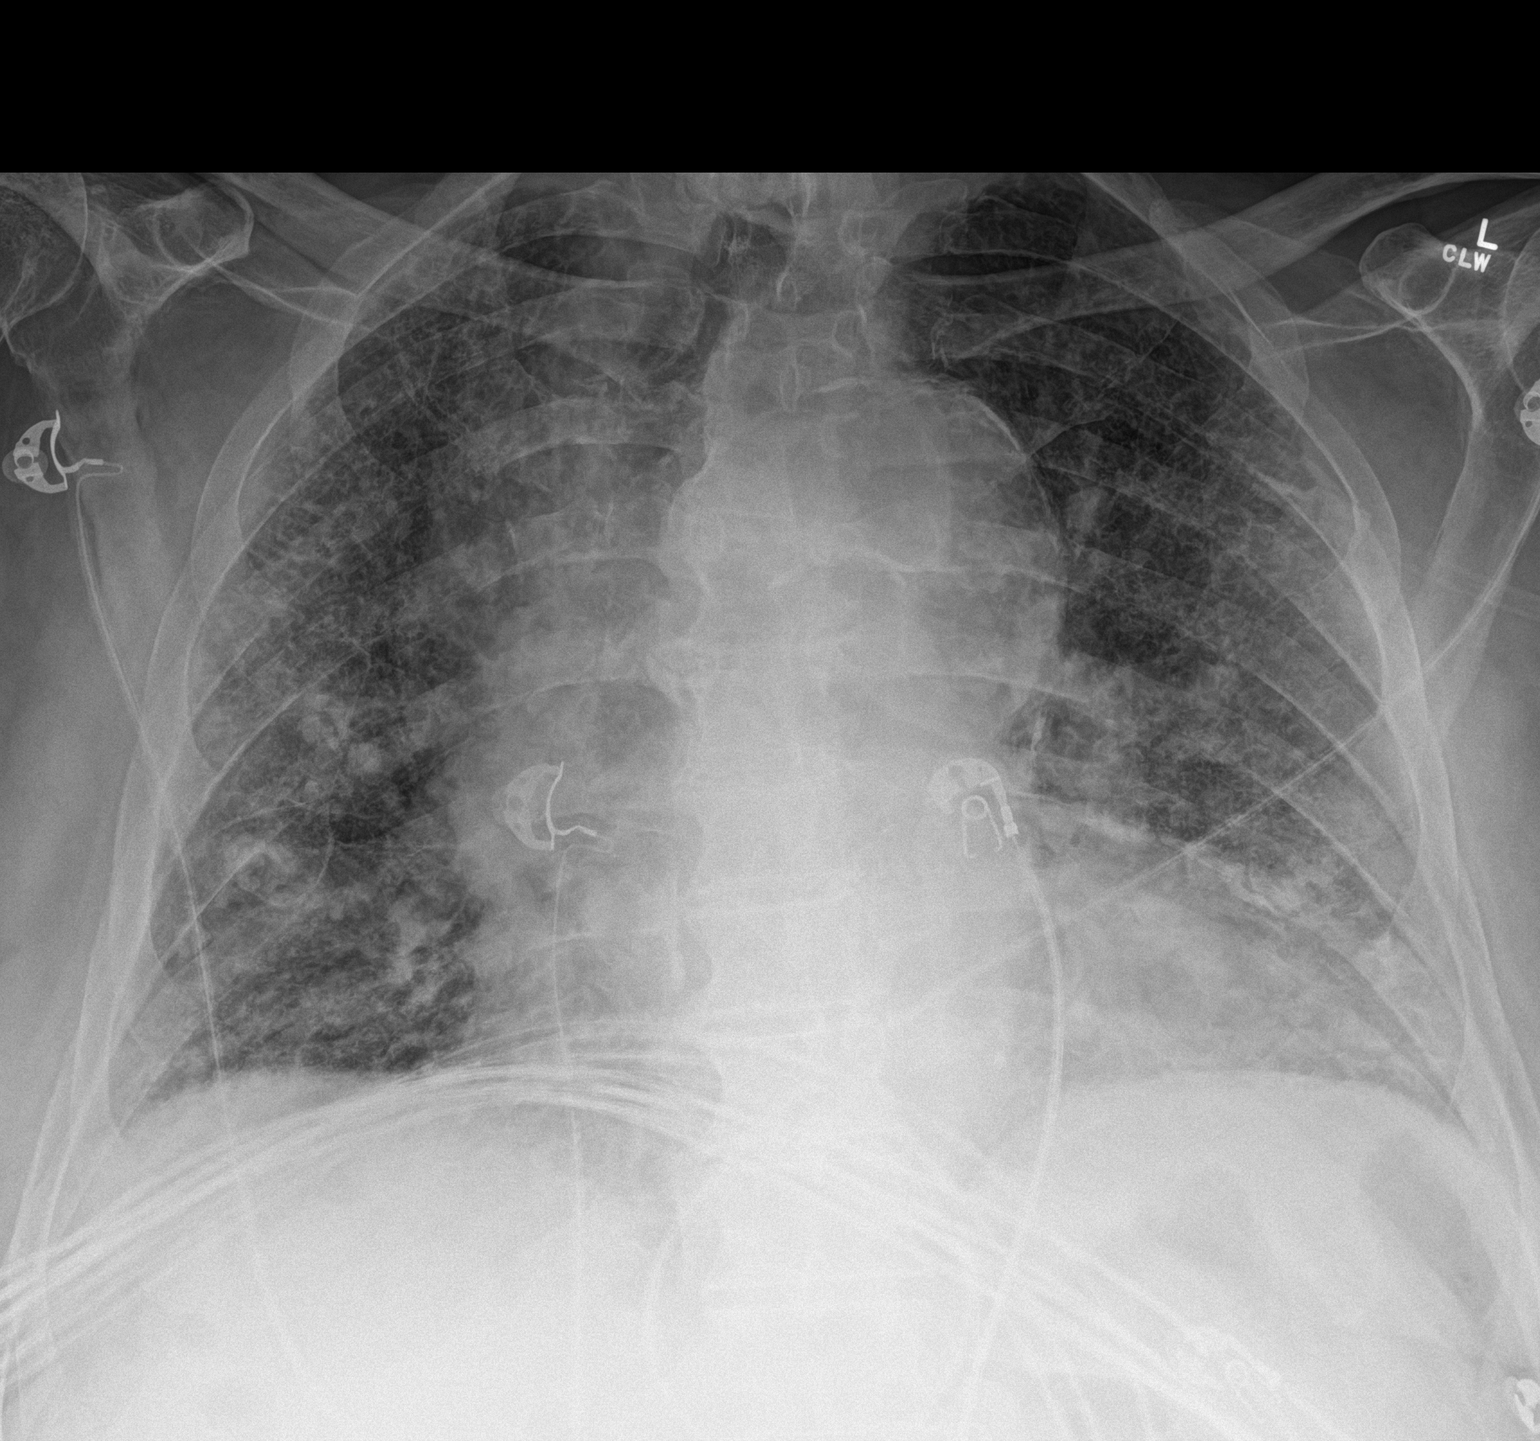

[1 of 1 positions shown; findings below may reference images not displayed]

FINDINGS: No pneumothorax. Known pleural plaques. Diffuse increased
interstitial opacities, mildly increased in the interval. Stable
cardiomegaly. Prominent tortuous thoracic aorta is poorly evaluated
due to the portable technique but similar to previous studies. No
other changes.
IMPRESSION: 1. Increased interstitial markings in the lungs suggests edema
versus atypical infection. Recommend clinical correlation.
2. Prominent tortuous thoracic aorta is similar to previous studies
but poorly evaluated given today's technique.
3. Known calcified pleural plaques.
4. Known emphysematous changes in the lungs.
